# Patient Record
Sex: Female | Born: 1935 | Race: White | Hispanic: No | Marital: Single | Smoking: Former smoker
Health system: Southern US, Community
[De-identification: ages and names within clinical notes are randomized; demographics above are authoritative.]

## PROBLEM LIST (undated history)

## (undated) DIAGNOSIS — I10 Essential (primary) hypertension: Secondary | ICD-10-CM

## (undated) DIAGNOSIS — I5032 Chronic diastolic (congestive) heart failure: Secondary | ICD-10-CM

## (undated) DIAGNOSIS — F0280 Dementia in other diseases classified elsewhere without behavioral disturbance: Secondary | ICD-10-CM

## (undated) DIAGNOSIS — I2510 Atherosclerotic heart disease of native coronary artery without angina pectoris: Secondary | ICD-10-CM

## (undated) DIAGNOSIS — I4891 Unspecified atrial fibrillation: Secondary | ICD-10-CM

## (undated) DIAGNOSIS — E785 Hyperlipidemia, unspecified: Secondary | ICD-10-CM

## (undated) DIAGNOSIS — I454 Nonspecific intraventricular block: Secondary | ICD-10-CM

## (undated) DIAGNOSIS — N183 Chronic kidney disease, stage 3 unspecified: Secondary | ICD-10-CM

## (undated) DIAGNOSIS — I509 Heart failure, unspecified: Secondary | ICD-10-CM

## (undated) DIAGNOSIS — K219 Gastro-esophageal reflux disease without esophagitis: Secondary | ICD-10-CM

## (undated) DIAGNOSIS — G309 Alzheimer's disease, unspecified: Secondary | ICD-10-CM

## (undated) HISTORY — PX: ABDOMINAL HYSTERECTOMY: SHX81

## (undated) HISTORY — DX: Heart failure, unspecified: I50.9

## (undated) HISTORY — DX: Nonspecific intraventricular block: I45.4

## (undated) HISTORY — PX: CHOLECYSTECTOMY: SHX55

## (undated) HISTORY — DX: Gastro-esophageal reflux disease without esophagitis: K21.9

## (undated) HISTORY — DX: Essential (primary) hypertension: I10

## (undated) HISTORY — PX: COLONOSCOPY W/ POLYPECTOMY: SHX1380

## (undated) HISTORY — DX: Hyperlipidemia, unspecified: E78.5

---

## 2000-11-06 ENCOUNTER — Encounter: Admission: RE | Admit: 2000-11-06 | Discharge: 2001-02-04 | Payer: Self-pay | Admitting: Family Medicine

## 2004-03-20 ENCOUNTER — Encounter: Admission: RE | Admit: 2004-03-20 | Discharge: 2004-03-20 | Payer: Self-pay | Admitting: Family Medicine

## 2004-05-07 ENCOUNTER — Emergency Department (HOSPITAL_COMMUNITY): Admission: EM | Admit: 2004-05-07 | Discharge: 2004-05-07 | Payer: Self-pay | Admitting: Emergency Medicine

## 2004-06-14 ENCOUNTER — Encounter: Admission: RE | Admit: 2004-06-14 | Discharge: 2004-06-14 | Payer: Self-pay | Admitting: Family Medicine

## 2006-01-16 ENCOUNTER — Emergency Department: Payer: Self-pay | Admitting: Emergency Medicine

## 2006-06-12 ENCOUNTER — Encounter: Admission: RE | Admit: 2006-06-12 | Discharge: 2006-06-12 | Payer: Self-pay | Admitting: Family Medicine

## 2006-06-19 ENCOUNTER — Encounter: Admission: RE | Admit: 2006-06-19 | Discharge: 2006-06-19 | Payer: Self-pay | Admitting: Family Medicine

## 2006-12-26 ENCOUNTER — Encounter: Admission: RE | Admit: 2006-12-26 | Discharge: 2006-12-26 | Payer: Self-pay | Admitting: Family Medicine

## 2010-06-09 ENCOUNTER — Ambulatory Visit (HOSPITAL_COMMUNITY): Admission: RE | Admit: 2010-06-09 | Discharge: 2010-06-09 | Payer: Self-pay | Admitting: Gastroenterology

## 2010-11-27 HISTORY — PX: CORONARY ARTERY BYPASS GRAFT: SHX141

## 2011-02-12 LAB — GLUCOSE, CAPILLARY: Glucose-Capillary: 144 mg/dL — ABNORMAL HIGH (ref 70–99)

## 2011-07-30 ENCOUNTER — Emergency Department (HOSPITAL_COMMUNITY): Payer: Medicare Other

## 2011-07-30 ENCOUNTER — Inpatient Hospital Stay (HOSPITAL_COMMUNITY)
Admission: EM | Admit: 2011-07-30 | Discharge: 2011-08-18 | DRG: 234 | Disposition: A | Discharge status: Home / Self Care | Payer: Medicare Other | Attending: Surgery | Admitting: Surgery

## 2011-07-30 DIAGNOSIS — I2510 Atherosclerotic heart disease of native coronary artery without angina pectoris: Secondary | ICD-10-CM | POA: Diagnosis present

## 2011-07-30 DIAGNOSIS — E119 Type 2 diabetes mellitus without complications: Secondary | ICD-10-CM | POA: Diagnosis present

## 2011-07-30 DIAGNOSIS — I10 Essential (primary) hypertension: Secondary | ICD-10-CM | POA: Diagnosis present

## 2011-07-30 DIAGNOSIS — M810 Age-related osteoporosis without current pathological fracture: Secondary | ICD-10-CM | POA: Diagnosis present

## 2011-07-30 DIAGNOSIS — K219 Gastro-esophageal reflux disease without esophagitis: Secondary | ICD-10-CM | POA: Diagnosis present

## 2011-07-30 DIAGNOSIS — I509 Heart failure, unspecified: Secondary | ICD-10-CM | POA: Diagnosis present

## 2011-07-30 DIAGNOSIS — D62 Acute posthemorrhagic anemia: Secondary | ICD-10-CM | POA: Diagnosis not present

## 2011-07-30 DIAGNOSIS — I447 Left bundle-branch block, unspecified: Secondary | ICD-10-CM | POA: Diagnosis present

## 2011-07-30 DIAGNOSIS — Z87891 Personal history of nicotine dependence: Secondary | ICD-10-CM

## 2011-07-30 DIAGNOSIS — I059 Rheumatic mitral valve disease, unspecified: Secondary | ICD-10-CM | POA: Diagnosis present

## 2011-07-30 DIAGNOSIS — E785 Hyperlipidemia, unspecified: Secondary | ICD-10-CM | POA: Diagnosis present

## 2011-07-30 DIAGNOSIS — I359 Nonrheumatic aortic valve disorder, unspecified: Secondary | ICD-10-CM | POA: Diagnosis present

## 2011-07-30 DIAGNOSIS — D696 Thrombocytopenia, unspecified: Secondary | ICD-10-CM | POA: Diagnosis present

## 2011-07-30 DIAGNOSIS — I4891 Unspecified atrial fibrillation: Secondary | ICD-10-CM | POA: Diagnosis present

## 2011-07-30 DIAGNOSIS — I5031 Acute diastolic (congestive) heart failure: Principal | ICD-10-CM | POA: Diagnosis present

## 2011-07-30 DIAGNOSIS — Z7901 Long term (current) use of anticoagulants: Secondary | ICD-10-CM

## 2011-07-30 LAB — BASIC METABOLIC PANEL
Calcium: 9.5 mg/dL (ref 8.4–10.5)
Chloride: 98 mEq/L (ref 96–112)
Creatinine, Ser: 0.78 mg/dL (ref 0.50–1.10)
GFR calc non Af Amer: >60 mL/min (ref >60)
Glucose, Bld: 83 mg/dL (ref 70–99)
Potassium: 3.2 mEq/L — ABNORMAL LOW (ref 3.5–5.1)

## 2011-07-30 LAB — CBC
HCT: 42 % (ref 36.0–46.0)
Hemoglobin: 14.3 g/dL (ref 12.0–15.0)
MCH: 29.1 pg (ref 26.0–34.0)
MCV: 85.5 fL (ref 78.0–100.0)
Platelets: 154 10*3/uL (ref 150–400)
RDW: 13.5 % (ref 11.5–15.5)

## 2011-07-30 LAB — CK TOTAL AND CKMB (NOT AT ARMC)
CK, MB: 6.5 ng/mL (ref 0.3–4.0)
Relative Index: 3.3 — ABNORMAL HIGH (ref 0.0–2.5)
Total CK: 197 U/L — ABNORMAL HIGH (ref 7–177)

## 2011-07-30 LAB — POCT I-STAT TROPONIN I: Troponin i, poc: 0.02 ng/mL (ref 0.00–0.08)

## 2011-07-30 LAB — CARDIAC PANEL(CRET KIN+CKTOT+MB+TROPI): CK, MB: 5.4 ng/mL — ABNORMAL HIGH (ref 0.3–4.0)

## 2011-07-30 LAB — DIFFERENTIAL: Basophils Relative: 0 % (ref 0–1)

## 2011-07-30 LAB — D-DIMER, QUANTITATIVE: D-Dimer, Quant: 0.56 ug/mL-FEU — ABNORMAL HIGH (ref 0.00–0.48)

## 2011-07-30 LAB — CK: Total CK: 172 U/L (ref 7–177)

## 2011-07-30 MED ORDER — IOHEXOL 300 MG/ML  SOLN
80.0000 mL | Freq: Once | INTRAMUSCULAR | Status: AC | PRN
  Administered 2011-07-30: 80 mL via INTRAVENOUS

## 2011-07-31 LAB — PRO B NATRIURETIC PEPTIDE: Pro B Natriuretic peptide (BNP): 5642 pg/mL — ABNORMAL HIGH (ref 0–450)

## 2011-07-31 LAB — GLUCOSE, CAPILLARY: Glucose-Capillary: 102 mg/dL — ABNORMAL HIGH (ref 70–99)

## 2011-07-31 LAB — BASIC METABOLIC PANEL
Calcium: 9.9 mg/dL (ref 8.4–10.5)
GFR calc non Af Amer: >60 mL/min (ref >60)
Sodium: 141 mEq/L (ref 135–145)

## 2011-07-31 LAB — LIPID PANEL
Cholesterol: 132 mg/dL (ref 0–200)
Triglycerides: 232 mg/dL — ABNORMAL HIGH (ref <150)

## 2011-07-31 LAB — CARDIAC PANEL(CRET KIN+CKTOT+MB+TROPI): Relative Index: 3.3 — ABNORMAL HIGH (ref 0.0–2.5)

## 2011-07-31 LAB — PROTIME-INR: Prothrombin Time: 23.3 seconds — ABNORMAL HIGH (ref 11.6–15.2)

## 2011-07-31 LAB — TSH: TSH: 1.093 u[IU]/mL (ref 0.350–4.500)

## 2011-08-01 ENCOUNTER — Inpatient Hospital Stay (HOSPITAL_COMMUNITY): Payer: Medicare Other

## 2011-08-01 LAB — GLUCOSE, CAPILLARY
Glucose-Capillary: 156 mg/dL — ABNORMAL HIGH (ref 70–99)
Glucose-Capillary: 150 mg/dL — ABNORMAL HIGH (ref 70–99)
Glucose-Capillary: 196 mg/dL — ABNORMAL HIGH (ref 70–99)

## 2011-08-01 LAB — BASIC METABOLIC PANEL
BUN: 27 mg/dL — ABNORMAL HIGH (ref 6–23)
CO2: 34 mEq/L — ABNORMAL HIGH (ref 19–32)
Chloride: 96 mEq/L (ref 96–112)
Creatinine, Ser: 0.87 mg/dL (ref 0.50–1.10)

## 2011-08-01 LAB — PRO B NATRIURETIC PEPTIDE: Pro B Natriuretic peptide (BNP): 510.6 pg/mL — ABNORMAL HIGH (ref 0–450)

## 2011-08-01 MED ORDER — TECHNETIUM TC 99M TETROFOSMIN IV KIT
10.0000 | PACK | Freq: Once | INTRAVENOUS | Status: AC | PRN
  Administered 2011-08-01: 10 via INTRAVENOUS

## 2011-08-01 MED ORDER — TECHNETIUM TC 99M TETROFOSMIN IV KIT
30.0000 | PACK | Freq: Once | INTRAVENOUS | Status: AC | PRN
  Administered 2011-08-01: 30 via INTRAVENOUS

## 2011-08-02 LAB — BASIC METABOLIC PANEL
BUN: 29 mg/dL — ABNORMAL HIGH (ref 6–23)
Chloride: 98 mEq/L (ref 96–112)
GFR calc Af Amer: >60 mL/min (ref >60)
Potassium: 4.4 mEq/L (ref 3.5–5.1)

## 2011-08-02 LAB — GLUCOSE, CAPILLARY: Glucose-Capillary: 140 mg/dL — ABNORMAL HIGH (ref 70–99)

## 2011-08-02 LAB — PROTIME-INR: Prothrombin Time: 27.8 seconds — ABNORMAL HIGH (ref 11.6–15.2)

## 2011-08-02 NOTE — H&P (Signed)
[name], [name]                ACCOUNT NO.:  [id number]  MEDICAL RECORD NO.:  [id number]  LOCATION:  [facility]                         FACILITY:  [facility]  PHYSICIAN:  Isidor Holts, M.D.  DATE OF BIRTH:  May 22, 1926  DATE OF ADMISSION:  07/30/2011 DATE OF DISCHARGE:                             HISTORY & PHYSICAL   PRIMARY CARE PHYSICIAN:  Lupita Raider, MD, [facility] at the [facility].  PRIMARY CARDIOLOGIST:  Lyn Records, MD  PRIMARY GASTROENTEROLOGIST:  Danise Edge, MD  CHIEF COMPLAINT:  Progressive shortness of breath for one week.  HISTORY OF PRESENT ILLNESS:  This is an 77 year old female.  She is an excellent historian, although history is also ably supplemented by the patient's family, who were present at the bedside.  According to them, the patient developed shortness of breath approximately 1 week ago, which has become progressive.  She was seen by Dr. Verdis Prime, [facility] Cardiologist on July 27, 2011 and already has a stress Myoview scheduled for August 02, 2011.  She was started on Coumadin at the time, although at this time the indication is unclear to me.  She has continued to have progressive shortness of breath, although she has had no chest pain.  No paroxysmal nocturnal dyspnea.  No cough or fever. She has now presented at the emergency department.  PAST MEDICAL HISTORY: 1. Hypertension. 2. Type 2 diabetes mellitus. 3. Dyslipidemia. 4. Chronic left bundle branch block documented on 12-lead EKG in July     /2011. 5. Osteoporosis. 6. GERD. 7. Status post cholecystectomy. 8. Status post hysterectomy. 9. Status post colonoscopy in July 2008 with removal of 4 neoplastic     polyps. 10.Status post surveillance colonoscopy in July 2011 with removal of 3     additional polyps.  ALLERGIES:  No known drug allergies.  MEDICATION HISTORY: 1. Calcium OTC 1 tablet p.o. daily. 2. Vitamin B12 OTC 1 tablet p.o. daily. 3. Metoprolol XL succinate 50 mg  p.o. q.a.m. 4. Crestor 20 mg p.o. q.a.m. 5. Lisinopril/hydrochlorothiazide (20/12.5) 1 tablet p.o. b.i.d. 6. Metformin 500 mg p.o. b.i.d. 7. Warfarin per INR, currently at 2.5 mg p.o. q. evening.  REVIEW OF SYSTEMS:  As per HPI and chief complaint, otherwise negative. The patient denies abdominal pain, vomiting, or diarrhea.  Denies constipation, and as matter of fact, moved her bowel this a.m.  Denies fever or chills.  She is able to ambulate for good distances on the flat, and is able to negotiate stairs without getting short of breath.  The patient denies ankle swelling or lower extremity pains.  SOCIAL HISTORY:  The patient is widowed for approximately 53 years now. Ex-smoker, quit about 30 years ago, nondrinker, has no history of drug abuse, and has offspring.  FAMILY HISTORY:  The patient's mother died in her 68s, she was hypertensive.  Her father died in his 47s, he had heart problems.  PHYSICAL EXAMINATION:  VITAL SIGNS:  Temperature 97.8, pulse 53 per minute regular, respiratory rate 13, BP 112/50 mmHg, and pulse oximetry 98% on room air. GENERAL:  The patient did not appear to be in obvious acute distress at time of this evaluation, alert, communicative, not short of breath at  rest. HEENT:  No clinical pallor or jaundice.  No conjunctival injection. Hydration status appears fair. NECK:  Supple.  JVP not seen.  No palpable lymphadenopathy.  No palpable goiter. CHEST:  Clinically clear to auscultation.  No wheezes or crackles. HEART:  Sounds are heard, normal, regular, no murmurs.  Mildly bradycardic. ABDOMEN:  Full, soft, nontender.  Umbilical hernia is noted.  No palpable organomegaly or palpable masses.  Normal bowel sounds. LOWER EXTREMITY EXAMINATION:  Minimal pitting edema bilaterally. MUSCULOSKELETAL SYSTEM:  Generalized osteoarthritic changes. CENTRAL NERVOUS SYSTEM:  No focal neurologic deficit on gross examination.  INVESTIGATIONS:  CBC, WBC 7.4, hemoglobin  14.3, hematocrit 42.0, platelets 154, INR 1.67.  Electrolytes, sodium 136, potassium 3.2, chloride 98, CO2 of 28, BUN 15, creatinine 0.78, glucose 83.  BNP 1879. D-dimer 0.56.  Troponin-I at point-of-care 0.02.  Chest x-ray on July 30, 2011 showed mild hyperinflation and prominent basilar interstitial densities.  No focal airspace disease.  Chest CT angiogram on July 30, 2011 showed no PE.  No thoracic aortic aneurysm.  There was cardiomegaly and coronary artery disease.  Also mild ground glass opacities consistent with mild interstitial edema.  A 12-lead EKG on July 30, 2011 showed sinus rhythm 60 per minute, normal axis, left bundle branch block pattern.  ASSESSMENT AND PLAN: 1. Acute decompensation of congestive heart failure.  LV function is     unknown at the present time.  We shall admit the patient for     telemetric monitoring, cycle cardiac enzymes to rule out possible     acute coronary syndrome since the patient has a chronic left bundle     branch block and therefore 12-lead EKG is not very helpful in this     regard.  We shall of course, do 2-D echocardiogram.  Meanwhile,     manage with intravenous Lasix.  Consult Dr. Baldomero Lamy al, for cardiology     input.  It is likely that the patient will indeed undergo stress     testing during this hospitalization.  2. Type 2 diabetes mellitus.  This appears controlled, based on random     blood glucose.  We shall discontinue metformin for now, given the     patient's age and also decompensated congestive heart failure, but     we will manage with appropriate diet and sliding scale insulin     coverage for now.  3. Dyslipidemia.  We shall continue statin, check TSH for     completeness, check lipid profile.  4. Hypertension.  This appears controlled at present time.  We shall     continue the patient's Lisinopril/hydrochlorothiazide.    Further management will depend on clinical course.     Isidor Holts,  M.D.     CO/MEDQ  D:  07/30/2011  T:  07/30/2011  Job:  [id number]  cc:   Lupita Raider, M.D. Lyn Records, M.D. Danise Edge, M.D.  Electronically Signed by Isidor Holts M.D. on 08/02/2011 12:08:56 PM

## 2011-08-03 LAB — BASIC METABOLIC PANEL
BUN: 34 mg/dL — ABNORMAL HIGH (ref 6–23)
Chloride: 100 mEq/L (ref 96–112)
GFR calc Af Amer: >60 mL/min (ref >60)
GFR calc non Af Amer: 55 mL/min — ABNORMAL LOW (ref >60)
Glucose, Bld: 129 mg/dL — ABNORMAL HIGH (ref 70–99)
Potassium: 5 mEq/L (ref 3.5–5.1)
Sodium: 139 mEq/L (ref 135–145)

## 2011-08-03 LAB — CBC
HCT: 41.8 % (ref 36.0–46.0)
Hemoglobin: 13.9 g/dL (ref 12.0–15.0)
MCHC: 33.3 g/dL (ref 30.0–36.0)
MCV: 85.7 fL (ref 78.0–100.0)
RDW: 13.8 % (ref 11.5–15.5)

## 2011-08-03 LAB — GLUCOSE, CAPILLARY: Glucose-Capillary: 148 mg/dL — ABNORMAL HIGH (ref 70–99)

## 2011-08-03 LAB — PROTIME-INR: INR: 1.55 — ABNORMAL HIGH (ref 0.00–1.49)

## 2011-08-04 ENCOUNTER — Inpatient Hospital Stay (HOSPITAL_COMMUNITY): Payer: Medicare Other

## 2011-08-04 DIAGNOSIS — Z01810 Encounter for preprocedural cardiovascular examination: Secondary | ICD-10-CM

## 2011-08-04 DIAGNOSIS — I2510 Atherosclerotic heart disease of native coronary artery without angina pectoris: Secondary | ICD-10-CM

## 2011-08-04 LAB — POCT ACTIVATED CLOTTING TIME: Activated Clotting Time: 171 seconds

## 2011-08-04 LAB — GLUCOSE, CAPILLARY
Glucose-Capillary: 138 mg/dL — ABNORMAL HIGH (ref 70–99)
Glucose-Capillary: 131 mg/dL — ABNORMAL HIGH (ref 70–99)
Glucose-Capillary: 149 mg/dL — ABNORMAL HIGH (ref 70–99)

## 2011-08-04 LAB — BASIC METABOLIC PANEL
CO2: 29 mEq/L (ref 19–32)
Chloride: 103 mEq/L (ref 96–112)
GFR calc Af Amer: >60 mL/min (ref >60)
Potassium: 4 mEq/L (ref 3.5–5.1)

## 2011-08-05 DIAGNOSIS — Z01810 Encounter for preprocedural cardiovascular examination: Secondary | ICD-10-CM

## 2011-08-05 DIAGNOSIS — I2510 Atherosclerotic heart disease of native coronary artery without angina pectoris: Secondary | ICD-10-CM

## 2011-08-05 LAB — CBC
HCT: 38.2 % (ref 36.0–46.0)
Hemoglobin: 12.5 g/dL (ref 12.0–15.0)
MCH: 28.4 pg (ref 26.0–34.0)
MCV: 86.8 fL (ref 78.0–100.0)
RBC: 4.4 MIL/uL (ref 3.87–5.11)

## 2011-08-05 LAB — GLUCOSE, CAPILLARY: Glucose-Capillary: 104 mg/dL — ABNORMAL HIGH (ref 70–99)

## 2011-08-05 LAB — BASIC METABOLIC PANEL
BUN: 22 mg/dL (ref 6–23)
CO2: 30 mEq/L (ref 19–32)
Chloride: 104 mEq/L (ref 96–112)
GFR calc Af Amer: >60 mL/min (ref >60)
Glucose, Bld: 144 mg/dL — ABNORMAL HIGH (ref 70–99)
Potassium: 4.1 mEq/L (ref 3.5–5.1)

## 2011-08-06 LAB — PROTIME-INR
INR: 1.23 (ref 0.00–1.49)
Prothrombin Time: 15.8 seconds — ABNORMAL HIGH (ref 11.6–15.2)

## 2011-08-06 LAB — GLUCOSE, CAPILLARY: Glucose-Capillary: 137 mg/dL — ABNORMAL HIGH (ref 70–99)

## 2011-08-06 NOTE — Cardiovascular Report (Signed)
[name], [name]                ACCOUNT NO.:  [id number]  MEDICAL RECORD NO.:  [id number]  LOCATION:  2504                         FACILITY:  [facility]  PHYSICIAN:  Lyn Records, M.D.   DATE OF BIRTH:  [birth year]  DATE OF PROCEDURE: DATE OF DISCHARGE:                           CARDIAC CATHETERIZATION   REASON FOR VISIT:  Heart catheterization report.  INDICATION FOR PROCEDURE:  Recent sudden pulmonary edema, low-normal left ventricle ejection fraction of 40% with inferior regional wall motion abnormality noted.  The patient also noted to have a high-risk myocardial perfusion study which suggested a low ejection fraction than the echocardiogram.  PROCEDURES PERFORMED: 1. Left heart cath. 2. Selective coronary angio. 3. Left ventriculography.  DESCRIPTION:  After informed consent, the right arm and right radial region was sterilely prepped and draped.  We were able to gain easy access into the right radial artery, however, we were not able to easily advance the tight J guidewire.  Angiography demonstrated a radial loop. At around this time of the case, a code STEMI was called on the patient in the emergency room for which I was responsible.  Resident spent time trying to negotiate the radial loop we converted to femoral access.  We then performed coronary angiography using 6-French equipment.  We did not perform left ventriculography.  LV function is already known.  We did selective coronary angiography using three-point catheters. Following the procedure, a wristband was applied to the right radial access site and compression by manual venous was used in the right femoral area with good hemostasis.  RESULTS: 1. Hemodynamic data:     a.     Aortic pressure 128/62.     b.     Left ventricular pressure:  Never recorded as we did not end      of the left ventricle. 2. Left ventriculography:  Not performed. 3. Selective coronary angiography.     a.     Left main coronary  widely patent.     b.     Left anterior descending coronary:  Moderate calcification.      The LAD is totally occluded in the midvessel after the origin of      the first septal perforator and a large first diagonal.  This      diagonal supplies collaterals around the left ventricular apex to      the distal LAD.  The LAD proximal to the first septal perforator      and the first diagonal contains a 90% stenosis.  The LAD is also      collateralized from the distal right coronary.  The LAD is heavily      calcified.     c.     Circumflex artery:  There is an ostial eccentric 90%      stenosis.  This is followed by segmental 70% narrowing.  A large      tortuous obtuse marginal that arises.  It gives collaterals to a      moderate-sized second obtuse marginal that is totally occluded and      fills late by retrograde collaterals.     d.     Right coronary:  The right coronary artery has three severe      stenoses.  There are tandem 90% stenoses of the mid segment and a      95% stenosis in the distal vessel before the origin of the PDA and      left ventricular branch.  As mentioned earlier, the distal LAD      does receive collaterals from the distal RCA.  CONCLUSION: 1. Severe three-vessel coronary disease with acute diastolic heart     failure as the cause of patient's presentation with pulmonary     edema. 2. Total occlusion of the mid LAD 90% stenosis before the origin of     the large diagonal that collateralizes the LAD, 95% stenosis of the     ostial circumflex, total occlusion of circumflex beyond the first     obtuse marginal with collaterals from OM1 to OM2 and multifocal     high-grade obstruction in the right coronary which also     collateralizes the LAD. 3. Decreased LV function as demonstrated by nuclear imaging at the     time of perfusion scanning and also echocardiography.  The echo     demonstrates inferior wall severe hypokinesis and an overall     ejection  fraction of 40%.  PLAN:  The patient will be considered for coronary artery bypass grafting.  I am concerned about her mild cognitive impairment, however, despite that she lives independently, drives her own car and continues to work as a Teacher, music houses.     Lyn Records, M.D.     HWS/MEDQ  D:  08/04/2011  T:  08/05/2011  Job:  [id number]  Electronically Signed by Verdis Prime M.D. on 08/06/2011 08:12:25 PM

## 2011-08-07 LAB — GLUCOSE, CAPILLARY
Glucose-Capillary: 119 mg/dL — ABNORMAL HIGH (ref 70–99)
Glucose-Capillary: 144 mg/dL — ABNORMAL HIGH (ref 70–99)
Glucose-Capillary: 93 mg/dL (ref 70–99)

## 2011-08-07 NOTE — Consult Note (Signed)
Denise, Ross                ACCOUNT NO.:  [id number]  MEDICAL RECORD NO.:  [id number]  LOCATION:  4731                         FACILITY:  [facility]  PHYSICIAN:  Georga Hacking, M.D.DATE OF BIRTH:  [birth year]  DATE OF CONSULTATION:  07/31/2011                                CONSULTATION   HISTORY:  I have asked to see this 77 year old female for cardiac consultation.  The patient has a longstanding history of hypertension and diabetes.  [name] has been living independently at home, but the family has been concerned about her ability to stay with herself and note some forgetfulness.  [name] was taken for a routine appointment last week to see Dr. Lupita Raider on Wednesday and then was later taken to Dr. Michaelle Copas office that day.  Exactly what transpired is unclear, but evidently the patient may have had a rapid heartbeat and was begun on anticoagulation and a beta-blocker.  The patient cannot give me any details of what happened and the family cannot either.  [name] called her son 2 days ago and left about 5 messages talking about coming appointments, then called her son yesterday morning complaining of shortness of breath and he went over to her house and brought her to the emergency room.  In the emergency room, [name] had a normal CBC, pro-time INR was 1.67, D-dimer was 0.56, potassium was 3.2.  Her CPK was elevated, but her troponin was normal.  B-natriuretic peptide is 1879.  Cholesterol is 132. Triglycerides were 232.  TSH is 1.093.  It was entirely unclear exactly what the history was yesterday and [name] had a two-view chest x-ray that showed some interstitial densities in the lower lungs, but not upper lungs.  CT angiogram of the chest showed heavy coronary calcifications. There was no thoracic aneurysm and [name] had a possible adrenal adenoma. There was possible mild interstitial edema, but this was unclear.  [name] was admitted and was given some furosemide and [name] was in sinus  rhythm on admission.  Presently, [name] does not complain of any shortness of breath.  The family knows that [name] maybe had some worsening dyspnea, although, the patient does not really complain of dyspnea.  [name] denies any anginal-type chest pain or chest pain consistent with myocardial ischemia.  PAST MEDICAL HISTORY:  Remarkable for chronic left bundle-branch block, osteoporosis, esophageal reflux, type 2 diabetes, dyslipidemia, and hypertension.  PAST SURGICAL HISTORY:  Cholecystectomy and hysterectomy.  ALLERGIES:  None.  MEDICATIONS PRIOR TO ADMISSION: 1. Crestor 20 mg. 2. Lisinopril/HCTZ b.i.d. 3. Metformin 500 b.i.d. 4. Warfarin. 5. Metoprolol succinate 50 mg daily. 6. Vitamin B12. 7. Calcium.  SOCIAL HISTORY:  [name] has been widowed for a long time.  [name] quit smoking many years ago.  [name] does not use alcohol to excess.  FAMILY HISTORY:  Mother died in her 43s with hypertension.  Father died in his 27s with heart problems.  REVIEW OF SYSTEMS:  Her weight has been stable.  [name] has had some mild memory issues.  [name] has mild decrease in her hearing as well as her vision.  [name] denies diarrhea, constipation, melena, or hematochezia.  No difficulty with her urinary system, except has increased frequency  since been started with Lasix.  Mild arthritis.  Normally can do housework without difficulty.  Other than as noted above, remainder of review of systems is unremarkable.  PHYSICAL EXAMINATION:  GENERAL:  Pleasant elderly female, in no acute distress. VITAL SIGNS:  Blood pressure is 102/61, pulse currently 60 and regular. SKIN:  Warm and dry. HEENT:  EOMI.  PERRLA.  CNS clear.  Fundi not examined.  Pharynx negative. NECK:  Supple without masses.  JVP is flat.  There are no carotid bruits noted. LUNGS:  Clear bilaterally without rales. CARDIOVASCULAR:  Faint 1-2/6 systolic murmur at the aortic area. ABDOMEN:  Soft and nontender. EXTREMITIES:  Her femoral pulses are 2+  without bruits.  There is no peripheral edema noted. EXTREMITIES:  No cyanosis or clubbing.  Distal pulses are 2+. NEUROLOGIC:  Normal cranial nerves.  [name] knew the year and the month, but could not tell me the day of the week.  EKG shows a left bundle-branch block.  Lab is noted as above.  IMPRESSION: 1. Vague history of possible increased shortness of breath, elevation     of the BNP consistent with mild volume overload, unclear whether     this is acute or chronic. 2. Atrial fibrillation by history. 3. Chronic left bundle-branch block. 4. Hypertensive heart disease. 5. Type 2 diabetes. 6. Hyperlipidemia.  RECOMMENDATIONS:  History is somewhat confusing.  [name] needs to have an echocardiogram.  Continue warfarin at this time.  I would agree with diuresis at the present time.  We will go ahead and since [name] was already set up for a Lexiscan, currently go ahead and plan this tomorrow per Dr. Katrinka Blazing.  He will review the records and further workup per Dr. Katrinka Blazing.     Georga Hacking, M.D.     WST/MEDQ  D:  07/31/2011  T:  07/31/2011  Job:  [id number]  cc:   Lyn Records, M.D. Lupita Raider, M.D.  Electronically Signed by Lacretia Nicks. Donnie Aho M.D. on 08/07/2011 03:55:14 PM

## 2011-08-07 NOTE — Consult Note (Signed)
[name], [name]                ACCOUNT NO.:  [id number]  MEDICAL RECORD NO.:  [id number]  LOCATION:  2020                         FACILITY:  [facility]  PHYSICIAN:  Evelene Croon, M.D.     DATE OF BIRTH:  September 11, 1926  DATE OF CONSULTATION:  08/04/2011 DATE OF DISCHARGE:                                CONSULTATION   REFERRING PHYSICIAN:  Lyn Records, MD  REASON FOR CONSULTATION:  Severe three-vessel coronary artery disease.  CLINICAL HISTORY:  I was asked by Dr. Katrinka Blazing to evaluate [name] for consideration of coronary artery bypass graft surgery.  She is an 77- year-old woman who is followed by Dr. Cam Hai.  She was apparently seen recently and found to be in atrial fibrillation and was referred to Dr. Katrinka Blazing on July 27, 2011.  Apparently, when she was seen by Dr. Katrinka Blazing, she really was not complaining of any chest pain or shortness of breath and was started on metoprolol as well as Coumadin for atrial fibrillation.  She was also scheduled for a stress Myoview examination on August 02, 2011.  She said that after that visit she developed recurrent progressive shortness of breath with minimal exertion.  Her son said that he had noticed that she was not doing as much activity as she had been.  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  He saw something strange about her activity level, but just recently this occurred.  She presented to the emergency room and was noted to have a BNP of 1879 with a mildly elevated troponin of 0.02.  She had a chest x-ray, this showed mild hyperinflation and prominent basilar interstitial densities with no airspace disease.  She had a CT angiogram of the chest that showed no evidence of pulmonary embolism.  There was cardiomegaly and evidence of coronary artery disease with calcifications present.  There were some mild ground-glass opacities consistent with mild interstitial  edema. Electrocardiogram showed a left bundle-branch block pattern.  She underwent a 2-D echocardiogram on August 01, 2011, which was not felt to be a great study, but did show left ventricular ejection fraction of 50-55%.  There was grade 2 diastolic dysfunction.  The aortic valve was poorly visualized, but was felt to have mild to moderate aortic stenosis by gradient.  The mean gradient was noted to be 13 mmHg and peak gradient of 23 mmHg.  The aortic valve area was 1.02 sq cm by VTI and 1.21 sq cm by V-max.  The leaflets were noted to be calcified and thickened.  There was also mild to moderate aortic insufficiency.  There was mild mitral regurgitation with a calcified mitral annulus.  Right ventricular function appeared normal.  There was no tricuspid or pulmonary regurgitation.  She subsequently underwent a nuclear stress test, which showed large area of scar involving the inferior lateral wall with peri-infarct ischemia in the inferoapical and inferoseptal walls.  The inferolateral wall was akinetic consistent with scar. Ejection fraction was measured at 28%.  Her subsequent cardiac enzymes appeared fairly unremarkable.  She underwent cardiac catheterization today, which showed severe three-vessel coronary artery disease.  There was total occlusion  of the mid LAD after the origin of the first septal perforator and large first diagonal branch.  The diagonal supplied collaterals around the left ventricular apex to the distal LAD.  The LAD proximally also had about 90% stenosis before this large diagonal branch.  The left circumflex had an ostial 90% stenosis followed by segmental 70% narrowing.  There was a large tortuous obtuse marginal. It gives collaterals to moderate-sized second marginal branch that was totally occluded and filled late by collaterals.  Right coronary artery had three severe stenoses.  There were tandem 90% midvessel stenosis and 95% stenosis in the distal  vessel before the origin of posterior descending.  The aortic valve apparently could not be crossed.  Her review of systems is as follows:  GENERAL:  She denies any fever or chills.  She has had no recent weight changes.  She has had recent fatigue.  EYES:  Negative.  ENT:  She has upper denture and several remaining teeth in her lower jaw.  ENDOCRINE:  She has adult-onset diabetes.  She denies hypothyroidism.  CARDIOVASCULAR:  She denies any chest pain or pressure.  She has had exertional dyspnea as well as dyspnea at rest.  She reports orthopnea.  She denies any peripheral edema or palpitations.  RESPIRATORY:  She denies cough or sputum production.  GI:  She has had no nausea or vomiting.  She denies melena or bright red blood per rectum.  GU:  She denies dysuria or hematuria. MUSCULOSKELETAL:  She denies arthralgias or myalgias.  NEUROLOGICAL: She denies any focal weakness or numbness.  She denies dizziness or syncope.  She has never had TIA or stroke.  Her family does note some short-term memory loss.  ALLERGIES:  None.  PSYCHIATRIC:  Negative. HEMATOLOGICAL:  Negative.  MEDICATIONS:  Prior to admission are as noted on her medicine reconciliation form.  These were reviewed.  PAST MEDICAL HISTORY:  Significant for: 1. Type 2 diabetes. 2. She has a history of hypertension. 3. History of dyslipidemia. 4. History of osteoporosis. 5. History of gastroesophageal reflux disease. 6. She is status post cholecystectomy, appendectomy, and hysterectomy. 7. She has had colonoscopy with removal of neoplastic polyps in 2008     and another colonoscopy in July 2011 with removal of three     additional polyps.  SOCIAL HISTORY:  She is widowed.  She is a previous smoker, but quit that 30 years ago.  She denies any alcohol or drug use.  FAMILY HISTORY:  Positive for cardiac disease.  Father died in his 54s with heart disease and mother died in her 26s.  PHYSICAL EXAMINATION:  VITAL SIGNS:   She is afebrile.  Blood pressure is 105/70.  Pulse is 65 and regular.  Respiratory rate is 20 and unlabored. GENERAL:  She is an elderly white female in no distress. HEENT:  Normocephalic and atraumatic.  Pupils are equal and reactive to light.  Extraocular muscles are intact.  Oropharynx is clear.  Her remaining lower teeth are in fair condition. NECK:  Normal carotid pulses bilaterally.  There is a transmitted murmur or bruit at both sides of the neck.  There is no adenopathy or thyromegaly. CARDIAC:  Regular rate and rhythm with a grade 1-2/6 systolic murmur over aorta. LUNGS:  Clear. ABDOMEN:  Active bowel sounds.  Abdomen is soft and nontender.  There are no palpable masses or organomegaly.  There is small umbilical hernia. EXTREMITIES:  No peripheral edema.  Pedal pulses are palpable bilaterally. SKIN:  Warm and  dry. NEUROLOGIC:  Alert and oriented x3.  Motor and sensory exam is grossly normal.  Electrolytes were normal with BUN of 22, creatinine of 0.8.  White blood cell count is 5.4, hemoglobin of 12.5, and platelet count is 124,000. Total cholesterol was 132 with triglyceride 232, HDL 47, and LDL 39. Hemoglobin A1c was 6.2.  IMPRESSION:  [name] has severe three-vessel coronary artery disease with evidence of significant infarct and ischemia by nuclear stress test.  She has moderate to severe left ventricular dysfunction.  Her echocardiogram also suggests the degree of aortic stenosis, but this is difficult to interpret based on that echocardiogram and the valve was not crossed during catheterization.  I agree that coronary artery bypass graft surgery is the best treatment for coronary artery disease given the multiple high-grade stenoses and calcification of her arteries.  Her aortic valve would have to be evaluated intraoperatively with TEE and decision made about replacing it.  I discussed the operative procedure of coronary artery bypass surgery and possible aortic  valve replacement with the patient and her family.  We discussed alternatives, benefits, and risks including but not limited to bleeding, blood transfusion, infection, stroke, myocardial infarction, graft failure, heart block requiring permanent pacemaker, organ dysfunction, and death.  She understands all of this and would like to proceed with surgery.  I told her that I would probably not be able to do surgery until next Thursday, but I would reevaluate the schedule and let her know.     Evelene Croon, M.D.     BB/MEDQ  D:  08/05/2011  T:  08/05/2011  Job:  [id number]  Electronically Signed by Evelene Croon M.D. on 08/07/2011 03:46:20 PM

## 2011-08-08 LAB — GLUCOSE, CAPILLARY
Glucose-Capillary: 180 mg/dL — ABNORMAL HIGH (ref 70–99)
Glucose-Capillary: 162 mg/dL — ABNORMAL HIGH (ref 70–99)

## 2011-08-09 ENCOUNTER — Inpatient Hospital Stay (HOSPITAL_COMMUNITY): Payer: Medicare Other

## 2011-08-09 LAB — CBC
HCT: 37.6 % (ref 36.0–46.0)
Hemoglobin: 12.6 g/dL (ref 12.0–15.0)
MCH: 28.8 pg (ref 26.0–34.0)
MCHC: 33.5 g/dL (ref 30.0–36.0)

## 2011-08-09 LAB — GLUCOSE, CAPILLARY
Glucose-Capillary: 120 mg/dL — ABNORMAL HIGH (ref 70–99)
Glucose-Capillary: 228 mg/dL — ABNORMAL HIGH (ref 70–99)

## 2011-08-09 LAB — BLOOD GAS, ARTERIAL
FIO2: 0.21 %
Patient temperature: 98.6
TCO2: 29.5 mmol/L (ref 0–100)
pH, Arterial: 7.443 — ABNORMAL HIGH (ref 7.350–7.400)

## 2011-08-09 LAB — COMPREHENSIVE METABOLIC PANEL
BUN: 19 mg/dL (ref 6–23)
CO2: 31 mEq/L (ref 19–32)
Chloride: 104 mEq/L (ref 96–112)
Creatinine, Ser: 1.02 mg/dL (ref 0.50–1.10)
GFR calc Af Amer: >60 mL/min (ref >60)
GFR calc non Af Amer: 52 mL/min — ABNORMAL LOW (ref >60)
Glucose, Bld: 141 mg/dL — ABNORMAL HIGH (ref 70–99)
Total Bilirubin: 0.3 mg/dL (ref 0.3–1.2)

## 2011-08-09 LAB — BASIC METABOLIC PANEL
BUN: 17 mg/dL (ref 6–23)
CO2: 34 mEq/L — ABNORMAL HIGH (ref 19–32)
Calcium: 9.6 mg/dL (ref 8.4–10.5)
GFR calc non Af Amer: >60 mL/min (ref >60)
Glucose, Bld: 104 mg/dL — ABNORMAL HIGH (ref 70–99)

## 2011-08-09 LAB — PROTIME-INR: Prothrombin Time: 14.3 seconds (ref 11.6–15.2)

## 2011-08-10 ENCOUNTER — Inpatient Hospital Stay (HOSPITAL_COMMUNITY): Payer: Medicare Other

## 2011-08-10 DIAGNOSIS — I2510 Atherosclerotic heart disease of native coronary artery without angina pectoris: Secondary | ICD-10-CM

## 2011-08-10 HISTORY — PX: OTHER SURGICAL HISTORY: SHX169

## 2011-08-10 LAB — POCT I-STAT 3, ART BLOOD GAS (G3+)
Acid-base deficit: 1 mmol/L (ref 0.0–2.0)
Acid-base deficit: 3 mmol/L — ABNORMAL HIGH (ref 0.0–2.0)
Bicarbonate: 24.2 mEq/L — ABNORMAL HIGH (ref 20.0–24.0)
Bicarbonate: 25.1 mEq/L — ABNORMAL HIGH (ref 20.0–24.0)
Bicarbonate: 25.1 mEq/L — ABNORMAL HIGH (ref 20.0–24.0)
O2 Saturation: 100 %
O2 Saturation: 100 %
Patient temperature: 36.3
TCO2: 32 mmol/L (ref 0–100)
TCO2: 25 mmol/L (ref 0–100)
TCO2: 26 mmol/L (ref 0–100)
TCO2: 27 mmol/L (ref 0–100)
pCO2 arterial: 46 mmHg — ABNORMAL HIGH (ref 35.0–45.0)
pH, Arterial: 7.393 (ref 7.350–7.400)
pH, Arterial: 7.408 — ABNORMAL HIGH (ref 7.350–7.400)
pO2, Arterial: 489 mmHg — ABNORMAL HIGH (ref 80.0–100.0)
pO2, Arterial: 406 mmHg — ABNORMAL HIGH (ref 80.0–100.0)
pO2, Arterial: 76 mmHg — ABNORMAL LOW (ref 80.0–100.0)

## 2011-08-10 LAB — CBC
HCT: 30.1 % — ABNORMAL LOW (ref 36.0–46.0)
HCT: 31.9 % — ABNORMAL LOW (ref 36.0–46.0)
Hemoglobin: 10.8 g/dL — ABNORMAL LOW (ref 12.0–15.0)
MCH: 28.8 pg (ref 26.0–34.0)
MCHC: 33.9 g/dL (ref 30.0–36.0)
MCV: 86.3 fL (ref 78.0–100.0)
MCV: 85.1 fL (ref 78.0–100.0)
Platelets: 132 10*3/uL — ABNORMAL LOW (ref 150–400)
Platelets: 98 10*3/uL — ABNORMAL LOW (ref 150–400)
Platelets: 122 10*3/uL — ABNORMAL LOW (ref 150–400)
RBC: 4.16 MIL/uL (ref 3.87–5.11)
RBC: 3.75 MIL/uL — ABNORMAL LOW (ref 3.87–5.11)
RDW: 13.8 % (ref 11.5–15.5)
RDW: 13.6 % (ref 11.5–15.5)
WBC: 5.5 10*3/uL (ref 4.0–10.5)
WBC: 11 10*3/uL — ABNORMAL HIGH (ref 4.0–10.5)
WBC: 16.3 10*3/uL — ABNORMAL HIGH (ref 4.0–10.5)

## 2011-08-10 LAB — BASIC METABOLIC PANEL
GFR calc Af Amer: >60 mL/min (ref >60)
GFR calc non Af Amer: >60 mL/min (ref >60)
Potassium: 4.2 mEq/L (ref 3.5–5.1)
Sodium: 140 mEq/L (ref 135–145)

## 2011-08-10 LAB — URINE MICROSCOPIC-ADD ON

## 2011-08-10 LAB — GLUCOSE, CAPILLARY
Glucose-Capillary: 101 mg/dL — ABNORMAL HIGH (ref 70–99)
Glucose-Capillary: 111 mg/dL — ABNORMAL HIGH (ref 70–99)

## 2011-08-10 LAB — POCT I-STAT 4, (NA,K, GLUC, HGB,HCT)
Glucose, Bld: 119 mg/dL — ABNORMAL HIGH (ref 70–99)
Glucose, Bld: 144 mg/dL — ABNORMAL HIGH (ref 70–99)
HCT: 29 % — ABNORMAL LOW (ref 36.0–46.0)
HCT: 24 % — ABNORMAL LOW (ref 36.0–46.0)
HCT: 24 % — ABNORMAL LOW (ref 36.0–46.0)
Hemoglobin: 8.2 g/dL — ABNORMAL LOW (ref 12.0–15.0)
Hemoglobin: 10.2 g/dL — ABNORMAL LOW (ref 12.0–15.0)
Potassium: 4.2 mEq/L (ref 3.5–5.1)
Potassium: 4.3 mEq/L (ref 3.5–5.1)
Potassium: 5.3 mEq/L — ABNORMAL HIGH (ref 3.5–5.1)
Sodium: 138 mEq/L (ref 135–145)
Sodium: 134 mEq/L — ABNORMAL LOW (ref 135–145)
Sodium: 135 mEq/L (ref 135–145)
Sodium: 137 mEq/L (ref 135–145)

## 2011-08-10 LAB — HEMOGLOBIN AND HEMATOCRIT, BLOOD
HCT: 25.2 % — ABNORMAL LOW (ref 36.0–46.0)
Hemoglobin: 8.4 g/dL — ABNORMAL LOW (ref 12.0–15.0)

## 2011-08-10 LAB — APTT: aPTT: 40 seconds — ABNORMAL HIGH (ref 24–37)

## 2011-08-10 LAB — PROTIME-INR
INR: 1.08 (ref 0.00–1.49)
Prothrombin Time: 14.2 seconds (ref 11.6–15.2)
Prothrombin Time: 17.3 seconds — ABNORMAL HIGH (ref 11.6–15.2)

## 2011-08-10 LAB — POCT I-STAT, CHEM 8
Calcium, Ion: 1.21 mmol/L (ref 1.12–1.32)
Glucose, Bld: 131 mg/dL — ABNORMAL HIGH (ref 70–99)
HCT: 32 % — ABNORMAL LOW (ref 36.0–46.0)
Hemoglobin: 10.9 g/dL — ABNORMAL LOW (ref 12.0–15.0)
TCO2: 22 mmol/L (ref 0–100)

## 2011-08-10 LAB — URINALYSIS, ROUTINE W REFLEX MICROSCOPIC
Glucose, UA: NEGATIVE mg/dL
Hgb urine dipstick: NEGATIVE
Protein, ur: NEGATIVE mg/dL

## 2011-08-10 LAB — PLATELET COUNT: Platelets: 122 10*3/uL — ABNORMAL LOW (ref 150–400)

## 2011-08-10 LAB — CREATININE, SERUM
Creatinine, Ser: 0.56 mg/dL (ref 0.50–1.10)
GFR calc Af Amer: >60 mL/min (ref >60)
GFR calc non Af Amer: >60 mL/min (ref >60)

## 2011-08-10 LAB — POCT I-STAT GLUCOSE: Glucose, Bld: 127 mg/dL — ABNORMAL HIGH (ref 70–99)

## 2011-08-11 ENCOUNTER — Inpatient Hospital Stay (HOSPITAL_COMMUNITY): Payer: Medicare Other

## 2011-08-11 LAB — POCT I-STAT, CHEM 8
BUN: 13 mg/dL (ref 6–23)
Calcium, Ion: 1.13 mmol/L (ref 1.12–1.32)
Chloride: 102 mEq/L (ref 96–112)
Glucose, Bld: 176 mg/dL — ABNORMAL HIGH (ref 70–99)
Potassium: 4.4 mEq/L (ref 3.5–5.1)

## 2011-08-11 LAB — MAGNESIUM: Magnesium: 2.1 mg/dL (ref 1.5–2.5)

## 2011-08-11 LAB — CBC
HCT: 27 % — ABNORMAL LOW (ref 36.0–46.0)
Hemoglobin: 8.6 g/dL — ABNORMAL LOW (ref 12.0–15.0)
MCHC: 33.9 g/dL (ref 30.0–36.0)
MCV: 86.3 fL (ref 78.0–100.0)
Platelets: 99 10*3/uL — ABNORMAL LOW (ref 150–400)
RBC: 3.13 MIL/uL — ABNORMAL LOW (ref 3.87–5.11)
WBC: 9.6 10*3/uL (ref 4.0–10.5)

## 2011-08-11 LAB — BASIC METABOLIC PANEL
Calcium: 8 mg/dL — ABNORMAL LOW (ref 8.4–10.5)
Chloride: 103 mEq/L (ref 96–112)
Creatinine, Ser: 0.51 mg/dL (ref 0.50–1.10)
GFR calc Af Amer: >60 mL/min (ref >60)
GFR calc non Af Amer: >60 mL/min (ref >60)

## 2011-08-11 LAB — PROTIME-INR
INR: 1.32 (ref 0.00–1.49)
Prothrombin Time: 16.6 seconds — ABNORMAL HIGH (ref 11.6–15.2)

## 2011-08-11 LAB — CREATININE, SERUM
Creatinine, Ser: 0.69 mg/dL (ref 0.50–1.10)
GFR calc Af Amer: >60 mL/min (ref >60)

## 2011-08-11 LAB — GLUCOSE, CAPILLARY
Glucose-Capillary: 103 mg/dL — ABNORMAL HIGH (ref 70–99)
Glucose-Capillary: 113 mg/dL — ABNORMAL HIGH (ref 70–99)
Glucose-Capillary: 115 mg/dL — ABNORMAL HIGH (ref 70–99)
Glucose-Capillary: 117 mg/dL — ABNORMAL HIGH (ref 70–99)
Glucose-Capillary: 125 mg/dL — ABNORMAL HIGH (ref 70–99)
Glucose-Capillary: 129 mg/dL — ABNORMAL HIGH (ref 70–99)
Glucose-Capillary: 136 mg/dL — ABNORMAL HIGH (ref 70–99)
Glucose-Capillary: 185 mg/dL — ABNORMAL HIGH (ref 70–99)
Glucose-Capillary: 91 mg/dL (ref 70–99)

## 2011-08-12 ENCOUNTER — Inpatient Hospital Stay (HOSPITAL_COMMUNITY): Payer: Medicare Other

## 2011-08-12 DIAGNOSIS — IMO0001 Reserved for inherently not codable concepts without codable children: Secondary | ICD-10-CM

## 2011-08-12 DIAGNOSIS — E1165 Type 2 diabetes mellitus with hyperglycemia: Secondary | ICD-10-CM

## 2011-08-12 LAB — GLUCOSE, CAPILLARY
Glucose-Capillary: 89 mg/dL (ref 70–99)
Glucose-Capillary: 127 mg/dL — ABNORMAL HIGH (ref 70–99)
Glucose-Capillary: 206 mg/dL — ABNORMAL HIGH (ref 70–99)
Glucose-Capillary: 178 mg/dL — ABNORMAL HIGH (ref 70–99)

## 2011-08-12 LAB — CBC
MCHC: 33.3 g/dL (ref 30.0–36.0)
MCV: 86.9 fL (ref 78.0–100.0)
Platelets: 107 10*3/uL — ABNORMAL LOW (ref 150–400)
RDW: 14.6 % (ref 11.5–15.5)
WBC: 8.1 10*3/uL (ref 4.0–10.5)

## 2011-08-12 LAB — BASIC METABOLIC PANEL
Calcium: 8.6 mg/dL (ref 8.4–10.5)
Creatinine, Ser: 0.79 mg/dL (ref 0.50–1.10)
GFR calc non Af Amer: >60 mL/min (ref >60)
Glucose, Bld: 75 mg/dL (ref 70–99)
Sodium: 135 mEq/L (ref 135–145)

## 2011-08-12 NOTE — Op Note (Signed)
  [name], [name]                ACCOUNT NO.:  [id number]  MEDICAL RECORD NO.:  [id number]  LOCATION:  2309                         FACILITY:  [facility]  PHYSICIAN:  Bedelia Person, M.D.        DATE OF BIRTH:  1925/12/19  DATE OF PROCEDURE:  08/10/2011 DATE OF DISCHARGE:                              OPERATIVE REPORT   SURGEON:  Bedelia Person, MD  The patient is scheduled at this time for coronary artery bypass grafting and the possibility of aortic valve replacement.  The TEE will be used intraoperatively to assess the need for aortic valve repair or replacement as well as left ventricular function after coronary artery bypass grafting.  The patient has no history of esophageal or gastric pathology.  The prebypass examination was performed after the patient was intubated with an oral endotracheal tube and gastric contents were suctioned with a nasogastric tube.  The probe was placed down the oropharynx with no significant resistance.  The prebypass examination revealed the left ventricle to be significantly thickened.  There was good contractility of the anterior wall; however, the patient had akinetic portions of the inferior wall and hypokinesis of the inferior lateral wall.  The left atrium was slightly enlarged.  Mitral valve appeared normal in appearance.  Color Doppler did reveal 2+ central mitral regurgitant flow.  The appendage was clean.  The inner atrial septum was intact. The aortic valve had thickened leaflets, all three leaflets appear to be opening and closing appropriately.  There was minimal restrictions of any leaflet.  There was just slight calcification of the annular ring. Aortic valve area was measured at approximately 1 centimeter squared. There was difficulty obtaining gradients; however, the gradients appeared to be very low.  There was just a very minimal central aortic insufficiency.  Due to this examination, the patient was scheduled only for coronary artery  bypass grafting, was placed on coronary artery bypass and at the completion of the bypass.  The examination was minimally changed from the prebypass examination.  Left ventricular contractility was initially slightly depressed, which quickly returned to prebypass levels.  The aortic valve and mitral valve remained unchanged.  Right heart exam, which was initially normal with trace tricuspid regurgitation shown prebypass and also extended postbypass noting that the Swan-Ganz catheter was present.  There were no other new significant findings.          ______________________________ Bedelia Person, M.D.     LK/MEDQ  D:  08/11/2011  T:  08/11/2011  Job:  [id number]  Electronically Signed by Bedelia Person M.D. on 08/12/2011 08:01:31 PM

## 2011-08-13 ENCOUNTER — Inpatient Hospital Stay (HOSPITAL_COMMUNITY): Payer: Medicare Other

## 2011-08-13 LAB — GLUCOSE, CAPILLARY
Glucose-Capillary: 180 mg/dL — ABNORMAL HIGH (ref 70–99)
Glucose-Capillary: 91 mg/dL (ref 70–99)
Glucose-Capillary: 147 mg/dL — ABNORMAL HIGH (ref 70–99)
Glucose-Capillary: 120 mg/dL — ABNORMAL HIGH (ref 70–99)

## 2011-08-13 LAB — BASIC METABOLIC PANEL
Calcium: 8.8 mg/dL (ref 8.4–10.5)
Chloride: 102 mEq/L (ref 96–112)
Creatinine, Ser: 0.71 mg/dL (ref 0.50–1.10)
GFR calc Af Amer: >60 mL/min (ref >60)
GFR calc non Af Amer: >60 mL/min (ref >60)

## 2011-08-13 LAB — CBC
Hemoglobin: 8 g/dL — ABNORMAL LOW (ref 12.0–15.0)
MCHC: 32.3 g/dL (ref 30.0–36.0)
Platelets: 113 10*3/uL — ABNORMAL LOW (ref 150–400)

## 2011-08-13 LAB — CROSSMATCH

## 2011-08-13 LAB — PROTIME-INR
INR: 1.25 (ref 0.00–1.49)
Prothrombin Time: 16 seconds — ABNORMAL HIGH (ref 11.6–15.2)

## 2011-08-14 ENCOUNTER — Inpatient Hospital Stay (HOSPITAL_COMMUNITY): Payer: Medicare Other

## 2011-08-14 LAB — GLUCOSE, CAPILLARY
Glucose-Capillary: 111 mg/dL — ABNORMAL HIGH (ref 70–99)
Glucose-Capillary: 120 mg/dL — ABNORMAL HIGH (ref 70–99)
Glucose-Capillary: 102 mg/dL — ABNORMAL HIGH (ref 70–99)

## 2011-08-14 LAB — CBC
HCT: 25.1 % — ABNORMAL LOW (ref 36.0–46.0)
Hemoglobin: 8.3 g/dL — ABNORMAL LOW (ref 12.0–15.0)
RBC: 2.89 MIL/uL — ABNORMAL LOW (ref 3.87–5.11)
WBC: 6.5 10*3/uL (ref 4.0–10.5)

## 2011-08-14 LAB — TSH: TSH: 2.188 u[IU]/mL (ref 0.350–4.500)

## 2011-08-14 LAB — BASIC METABOLIC PANEL
BUN: 18 mg/dL (ref 6–23)
Chloride: 104 mEq/L (ref 96–112)
Glucose, Bld: 119 mg/dL — ABNORMAL HIGH (ref 70–99)
Potassium: 3.8 mEq/L (ref 3.5–5.1)
Sodium: 137 mEq/L (ref 135–145)

## 2011-08-16 LAB — GLUCOSE, CAPILLARY
Glucose-Capillary: 107 mg/dL — ABNORMAL HIGH (ref 70–99)
Glucose-Capillary: 107 mg/dL — ABNORMAL HIGH (ref 70–99)
Glucose-Capillary: 81 mg/dL (ref 70–99)

## 2011-08-17 LAB — GLUCOSE, CAPILLARY: Glucose-Capillary: 116 mg/dL — ABNORMAL HIGH (ref 70–99)

## 2011-08-18 LAB — GLUCOSE, CAPILLARY
Glucose-Capillary: 104 mg/dL — ABNORMAL HIGH (ref 70–99)
Glucose-Capillary: 102 mg/dL — ABNORMAL HIGH (ref 70–99)

## 2011-08-24 NOTE — Discharge Summary (Signed)
NAMEJERRIKA, [name]                ACCOUNT NO.:  [id number]  MEDICAL RECORD NO.:  [id number]  LOCATION:  2036                         FACILITY:  [facility]  PHYSICIAN:  Evelene Croon, M.D.     DATE OF BIRTH:  [birth year]  DATE OF ADMISSION:  07/30/2011 DATE OF DISCHARGE:  08/17/2011                              DISCHARGE SUMMARY   ADMITTING DIAGNOSES: 1. Multivessel coronary artery disease (with an ejection fraction of     40%). 2. Recently diagnosed atrial fibrillation (on Coumadin). 3. History of diabetes mellitus. 4. History of hyperlipidemia. 5. History of hypertension. 6. History of gastroesophageal reflux disease. 7. History of osteoporosis. 8. History of polyps (status post removal). 9. History of remote tobacco abuse.  DISCHARGE DIAGNOSES: 1. Multivessel coronary artery disease (with an ejection fraction of     40%). 2. Recently diagnosed atrial fibrillation (on Coumadin). 3. History of diabetes mellitus. 4. History of hyperlipidemia. 5. History of hypertension. 6. History of gastroesophageal reflux disease. 7. History of osteoporosis. 8. History of polyps (status post removal). 9. History of remote tobacco abuse. 10.Acute blood loss anemia. 11.Thrombocytopenia.  PROCEDURES: 1. 2-D echocardiogram done on August 01, 2011. 2. Cardiac catheterization performed by Dr. Katrinka Blazing on August 05, 2011. 3. Median sternotomy for coronary artery bypass graft x5 (left     internal mammary artery to left anterior descending, saphenous vein     graft to diagonal, saphenous vein graft sequentially to obtuse     marginal 1 and 2, and saphenous vein graft to the right coronary     artery with endovein harvest from the left thigh and calf by Dr.     Laneta Simmers on August 10, 2011. 4. Intraoperative TEE performed on August 10, 2011 by Dr. Gypsy Balsam.   HISTORY OF PRESENT ILLNESS:  This is an 77 year old Caucasian female who was recently found to be in atrial fibrillation.  She was  referred to Dr. Katrinka Blazing on July 27, 2011.  At that time, she was not complaining of any chest pain or shortness of breath.  She was started on metoprolol and Coumadin for her atrial fibrillation.  She was scheduled for a stress Myoview on August 02, 2011.  However, she then developed progressive shortness of breath with minimal exertion.  She presented to the emergency room on July 30, 2011 with complaints of progressive shortness of breath mostly occurring in the last week.  Upon her arrival to [facility] Emergency Room, she was found to have a BNP of 1879 and a troponin of 0.02.  Chest x-ray showed mild hyperinflation of prominent basilar interstitial densities with no airspace disease.  She then had a CT angio of the chest that showed no pulmonary embolism, there was cardiomegaly, and evidence of coronary artery disease with calcifications present.  In addition, there were some mild ground-glass opacities consistent with mild interstitial edema.  EKG that was done showed a left bundle-branch block.        She then underwent a 2-D echocardiogram on August 01, 2011 that showed the EF to be 50-55%, grade 2 diastolic dysfunction, aortic valve was poorly visualized, mild- to-moderate aortic stenosis, aortic valve area 1.02 cm2.  The leaflets were noted to be calcified and thickened and there was also mild-to- moderate AI as well as mild MR with a calcified mitral annulus.  There was no tricuspid or pulmonary regurgitation, however.  She then underwent a nuclear stress test which showed large area of scar involving inferolateral wall with peri-infarct ischemia in the inferior apical and septal walls.  The inferior lateral wall was also akinetic consistent with a scar, anterior EF was estimated to be 28%.  She then was scheduled for cardiac catheterization by Dr. Katrinka Blazing on July 05, 2011.  The patient was found to have multivessel coronary artery disease with an EF of 40%.   Specifically, she was found to have a total occlusion of the mid LAD, the proximal LAD had a 90% stenosis, the left circumflex had a 90% ostial stenosis followed by 70% segmental narrowing, and the right coronary artery had a tandem 90% midvessel stenosis and 95% stenosis in the distal vessel before the origin of the posterior descending coronary artery.     A cardiothoracic consultation was obtained with Dr. Laneta Simmers for the consideration of coronary artery bypass grafting surgery.  Potential risks, complications, and benefits of the surgery were discussed with the patient and she agreed to proceed.  She did undergo a carotid duplex carotid ultrasound prior to undergoing heart surgery.  There was no evidence of significant left internal carotid artery stenosis and the right internal carotid artery had approximate 40-59% stenosis.  BRIEF HOSPITAL COURSE STAY:  The patient was extubated in the evening of surgery without difficulty.  She remained afebrile and hemodynamically stable.  She initially did require AAI pacing and as well as Neo- Synephrine.  Swan-Ganz, A-line, chest tubes, and Foley were all removed earlier in her postoperative course.  She was eventually weaned off the Neo-Synephrine and a low-dose beta-blocker was started.  She then developed atrial fibrillation with rapid ventricular rate.  She was given amiodarone bolus and then placed on amiodarone drip.  She then converted to sinus rhythm.  She was placed on amiodarone 400 mg p.o. two times daily.  She then went back into atrial fibrillation, however, and was given an amiodarone bolus and her Lopressor was increased to 25 mg p.o. 2 times daily.  Once again, she converted to sinus rhythm where she currently remains.  As previously stated, the patient has a history of diabetes mellitus.  She was weaned off her insulin drip and restarted on metformin.  She was also started on 1 mg of Amaryl.  According to the office note from  [facility], she was on 4 mg p.o. daily.  Her Amaryl will be increased as her glucose continues to increase.  She was found to be volume overloaded and diuresed accordingly.  She was also found to have acute blood loss anemia.  Her last H and H was 8.3 and 25.1.  She did not require postoperative transfusion.  She also was found to have thrombocytopenia postoperatively.  Her platelet count went as low as 107,000, however, her last platelet count was up to 138,000. The patient was felt surgically stable for transfer from the Intensive Care Unit to 2000 for further convalescence on August 15, 2011. Currently, on postop day #6, she is afebrile, heart rate in the 60s, she is in sinus rhythm (BP 132/81), O2 sat 97% on room air.  She has already been tolerating a diet and has had a bowel movement and she is going to be evaluated by PT and OT today to  assist with her ambulation.  PHYSICAL EXAMINATION:  CARDIOVASCULAR:  Regular rate and rhythm. PULMONARY:  Slight decreased at the bases. ABDOMEN:  Soft and nontender.  Bowel sounds present.  Some distention. EXTREMITIES:  2+ lower extremity edema.  Her sternal and lower extremity wounds are clean, dry, and continuing to heal.  Provided she remains afebrile, hemodynamically stable, and pending morning round evaluation, she will be surgically stable for discharge to SNF on August 17, 2011.  LATEST LABORATORY STUDIES:  As follows, TSH on August 14, 2011 is 2.188.  BMET on this date, potassium 3.8, sodium 137, BUN and creatinine 18 and 0.64 respectively.  CBC on this date, H and H 8.3 and 25.1, white count 6500, and platelet count 138,000.  Chest x-ray done on this date on August 14, 2011, shows small bilateral pleural effusions with bibasilar atelectasis, no pneumothorax.  DISCHARGE INSTRUCTIONS:  Include the following:  DIET:  Low-sodium, heart-healthy, diabetic diet.  ACTIVITY:  The patient is to increase her activity slowly.   She may walk up steps.  She may shower.  She is not to lift more than 10 pounds for 4 weeks and not to drive until after 4 weeks.  She is to continue with her breathing exercise daily.  She is to walk and increase her frequency and duration as she tolerates.  WOUND CARE:  The patient is to use soap and water on her wounds.  She is to contact the office if any wound problems arise.  FOLLOWUP APPOINTMENTS: 1. The patient is to contact Dr. Michaelle Copas office for a followup     appointment 2 weeks. 2. The patient needs to contact Dr. Alver Fisher office for a followup     appointment regarding further diabetes management. 3. The patient has an appointment to see Dr. Laneta Simmers physician's     assistant on September 11, 2011 at 1 p.m. 45 minutes.  Prior to this     office appointment, a chest x-ray should be obtained.  MEDICATIONS AT THE TIME OF DICTATION: 1. Amiodarone 400 mg p.o. 2 times daily for 2 days, then 200 mg p.o. 2     times daily thereafter. 2. Enteric-coated aspirin 325 mg p.o. daily. 3. Lasix 40 mg p.o. daily x7 days. 4. Potassium chloride 20 mEq p.o. daily x7 days. 5. Amaryl 1 mg p.o. daily before breakfast, this is to be increased to     her preop dose of 4 mg p.o. daily before breakfast after glucose     continues to increase. 6. Sliding scale as printed on discharge sheet. 7. Lopressor 25 mg p.o. 2 times daily. 8. Ultram 50 mg 1-2 tablets p.o. q.46 hours p.r.n. pain. 9. Calcium p.o. q.a.m. 10.Crestor 20 mg p.o. q.a.m. 11.Metformin 500 mg p.o. 2 times daily. 12.Vitamin B12 p.o. q.a.m.  Please note patient was not placed on an ACE or ARB as blood pressure was well controlled with Lopressor.   Doree Fudge, PA   ______________________________ Evelene Croon, M.D.    DZ/MEDQ  D:  08/16/2011  T:  08/16/2011  Job:  [id number]  cc:   Kari Baars, M.D. Lyn Records, M.D.  Electronically Signed by Doree Fudge PA on 08/17/2011 08:02:31 AM Electronically Signed by  Evelene Croon M.D. on 08/24/2011 04:29:19 PM

## 2011-08-24 NOTE — Discharge Summary (Signed)
  NAMESONYA, [name]                ACCOUNT NO.:  [id number]  MEDICAL RECORD NO.:  [id number]  LOCATION:  2036                         FACILITY:  [facility]  PHYSICIAN:  Evelene Croon, M.D.     DATE OF BIRTH:  August 26, 1926  DATE OF ADMISSION:  07/30/2011 DATE OF DISCHARGE:                              DISCHARGE SUMMARY   ADDENDUM:  The patient with anticipation transferred to skilled nursing facility.  She was ready for transfer on August 17, 2011.  At that time, the patient was awaiting bed offers.  On August 17, 2011, the patient was noted to be in normal sinus rhythm.  She had no further atrial fibrillation up to that day.  Dr. Katrinka Blazing decreased her amiodarone to 200 mg b.i.d.  Following day on August 18, 2011, the patient noted to have recurrent rate-controlled atrial fibrillation.  She was continued on the amiodarone as well as Lopressor.  Due to the patient's recurrent rate-controlled atrial fibrillation, it was felt that she would benefit from Coumadin.  This was started on August 18, 2011.  Plan will be to start the patient on low-dose Coumadin at 2.5 mg at night.  PT/INR level will need to be drawn at the skilled nursing facility on August 21, 2011 and faxed to Dr. Michaelle Copas office for management of Coumadin dosage.  The patient on August 18, 2011 was noted to be afebrile.  Blood pressure stable.  O2 sats were greater than 90% on room air.  Incisions were clean, dry, and intact and healing well.  She continued to progress well with ambulation.  She was tolerating diet well.  The patient was seen by Dr. Laneta Simmers on August 18, 2011 and felt that the patient is ready for transfer to skilled nursing facility today.  Please see dictated discharge summary for followup appointments and discharge instructions.  DISCHARGE MEDICATIONS: 1. Amiodarone 200 mg b.i.d. 2. Enteric-coated aspirin 325 mg daily. 3. Coumadin 2.5 mg at night. 4. Lasix 40 mg daily x7 days. 5.  Amaryl 1 mg daily before breakfast and increased to 4 mg as blood     sugars increased. 6. Sliding scale insulin. 7. Lopressor 25 mg b.i.d. 8. Potassium chloride 20 mEq daily x7 days. 9. Ultram 50 mg 1-2 tablets q.4-6 hours p.r.n. pain. 10.Calcium daily. 11.Crestor 20 mg daily. 12.Metformin 500 mg b.i.d. 13.Vitamin B12 daily.     Sol Blazing, PA   ______________________________ Evelene Croon, M.D.    KMD/MEDQ  D:  08/18/2011  T:  08/18/2011  Job:  [id number]  Electronically Signed by Cameron Proud PA on 08/22/2011 11:24:29 AM Electronically Signed by Evelene Croon M.D. on 08/24/2011 56:21:30 PM

## 2011-08-24 NOTE — Op Note (Signed)
[name], [name]                ACCOUNT NO.:  [id number]  MEDICAL RECORD NO.:  [id number]  LOCATION:  2309                         FACILITY:  [facility]  PHYSICIAN:  Evelene Croon, M.D.     DATE OF BIRTH:  12/15/1925  DATE OF PROCEDURE:  08/10/2011 DATE OF DISCHARGE:                              OPERATIVE REPORT   PREOPERATIVE DIAGNOSIS:  Severe three-vessel coronary artery disease.  POSTOPERATIVE DIAGNOSIS:  Severe three-vessel coronary artery disease.  OPERATIVE PROCEDURE:  Median sternotomy, extracorporeal circulation, coronary artery bypass graft surgery x5 using a left internal mammary artery graft to the left anterior descending coronary artery, with a saphenous vein graft to diagonal branch of the LAD, a sequential saphenous vein graft to the first and second obtuse marginal branches of the left circumflex coronary artery, and a saphenous vein graft to the right coronary artery.  Endoscopic vein harvesting from the left leg.  SURGEON:  Evelene Croon, MD  ASSISTANT:  Gershon Crane, PA-C  ANESTHESIA:  General endotracheal.  CLINICAL HISTORY:  This patient is an 77 year old woman who was recently found to be in atrial fibrillation during a visit with her primary physician.  She was referred to Dr. Verdis Prime on July 27, 2011, and was started on metoprolol and Coumadin for atrial fibrillation.  She was also scheduled for a stress Myoview examination on August 02, 2011. After that visit, she had recurrent episodes of progressive shortness of breath with minimal exertion.  She presented to the emergency room with an elevated BMP of 1879 with mildly elevated troponin of 0.02.  CT angiogram of the chest showed no evidence of pulmonary embolism but did show cardiomegaly and evidence of coronary artery disease with calcifications present in the coronary arteries.  There was some mild ground-glass opacities consistent with mild interstitial edema.  She underwent a 2-D  echocardiogram on August 01, 2011, which was not felt to be an ideal study but did show a left ventricular ejection fraction of 50-55% with grade 2 diastolic dysfunction.  The aortic valve was poorly visualized but was felt to have mild-to-moderate aortic stenosis by gradient.  The mean gradient was noted to be 13 and the peak gradient 23.  Aortic valve area was 1.02 cm2 by PTI and 1.21 cm2 by Bmax.  The leaflets were calcified and thickened.  There was also a mild-to- moderate aortic insufficiency and mild mitral regurgitation with calcified mitral annulus.  She subsequently underwent a nuclear stress test which showed large area of scar involving the inferolateral wall with peri-infarct ischemia in the inferoapical and inferoseptal walls. The inferolateral wall was akinetic consistent with scar.  Ejection fraction measured 28% during that study.  She underwent cardiac catheterization which showed severe three-vessel disease.  There was total occlusion of the mid LAD after the origin of the first septal perforator and an enlarged first diagonal branch.  The diagonal supplied collaterals around the left ventricular apex of the distal LAD.  The LAD proximally had about 90% stenosis before this large diagonal branch. Left circumflex had an ostial 90% stenosis, followed by segmental 70% narrowing.  There was a large first marginal.  It gave collaterals to a moderate-sized second marginal branch  that was totally occluded.  The right coronary artery had three severe stenoses with tandem 90% midvessel stenosis and 95% stenosis in the distal vessel before the origin of the posterior descending branch.  The aortic valve was not crossed at the time of catheterization.  After review of the catheterization and echocardiogram, I felt the best treatment would be coronary artery bypass graft surgery with intraoperative examination of the aortic valve using TEE.  I discussed the operative procedure  of coronary artery bypass graft surgery and possible aortic valve replacement with the patient and her family.  We discussed alternatives, benefits, and risks including but not limited to bleeding, blood transfusion, infection, stroke, myocardial infarction, graft failure, and death.  I also discussed the possibility that her aortic valve was not diseased enough to replace that she could develop progressive aortic stenosis in the future.  They understood all this and agreed to proceed.  OPERATIVE PROCEDURE:  The patient was taken to the operating room and placed on the table in supine position.  After induction of general endotracheal anesthesia, a Foley catheter was placed in the bladder using sterile technique.  Then the chest, abdomen and both lower extremities were prepped and draped in usual sterile manner. Transesophageal echocardiogram was performed by Anesthesiology.  This showed very mild aortic stenosis.  The aortic valve leaflets appeared thickened but not particularly calcified and moved well.  There was minimal gradient measured across the aortic valve.  Aortic valve area was calculated about 1.5 cm2.  There was mild central aortic insufficiency.  There was mild mitral regurgitation with a narrow jet. Left ventricular function appeared well-preserved.  Right heart function appeared normal.  After review of this, I did not feel that aortic valve replacement was warranted.  A prosthetic valve in this small lady would most likely have a higher gradient that she has now and a smaller valve area and the only benefit would be potentially avoiding progressive aortic stenosis in the future.  It is also possible that she could calcify a prosthetic valve more rapidly and end up with a worse problem in the future.  Therefore, I decided to leave her aortic valve alone.  Then, the chest was entered through a median sternotomy incision.  The pericardium was opened in the midline.   Examination of the heart showed good ventricular contractility.  The ascending aorta had no palpable plaques in it.  It was relatively a small aorta.  Then, the left internal mammary artery was harvested from the chest wall as a pedicle graft.  This was a medium caliber vessel with excellent blood flow through it.  At the same time, a segment of greater saphenous vein was harvested from the left leg using endoscopic vein harvest technique.  We initially examined the vein in the right leg and this vein was very small was therefore not harvested.  Then, the patient was heparinized and when an adequate ACT was obtained, the distal ascending aorta was cannulated using a 20-French aortic cannula for arterial inflow.  Venous outflow was achieved using two- stage venous cannula for the right atrial appendage.  An antegrade cardioplegia and vent cannula were inserted in the aortic root.  The patient was placed on cardiopulmonary bypass and distal coronaries were identified.  The LAD was diffusely diseased with calcific plaque and this was almost concentric and extended out to the apex of the heart.  There was one area in the midportion that was soft enough to graft anteriorly.  The diagonal branch  was a large graftable vessel with proximal disease but the distal vessel had no disease in it.  The first marginal branch was a large graftable vessel with mild disease.  The second marginal was a smaller vessel which was borderline and graftable. The right coronary artery was diffusely diseased with calcific plaque. There was an area just before the take off of the posterior descending branch which appeared soft and not to graft.  Then, the aorta was crossclamped and 1000 mL of cold blood antegrade cardioplegia was administered in the aortic root with quick arrest of the heart.  Systemic hypothermia to 20 degrees centigrade and topical hypothermic iced saline was used.  A temperature probe was  placed in the septum, insulating pad in the pericardium.  The first distal anastomosis was performed in the distal right coronary artery.  The internal diameter of this vessel was about 2 mm.  The conduit used was a segment of greater saphenous vein and the anastomosis performed in an end-to-side manner using continuous 7-0 Prolene suture. Flow was noted through the graft and was excellent.  The second distal anastomosis was performed to the first marginal branch.  The internal diameter of this vessel was about 1.75 mm.  The conduit used was a second segment of greater saphenous vein and the anastomosis performed in a sequential side-to-side manner using continuous 7-0 Prolene suture.  Flow was noted through the graft and was excellent.  The third distal anastomosis was performed to the second marginal branch.  The internal diameter of this vessel was about 1.5 mm.  The conduit used was the same segment of greater saphenous vein and the anastomosis performed in a sequential end-to-side manner using continuous 7-0 Prolene suture.  Flow was noted through the graft and was excellent.  Then, dose of cardioplegia was given down the vein grafts and aortic root.  The fourth distal anastomosis was performed to the diagonal branch.  The internal diameter of this vessel was about 1.75 mm.  The conduit used was a third segment of greater saphenous vein and the anastomosis performed in an end-to-side manner using continuous 7-0 Prolene suture. Flow was noted through the graft was excellent.  The fifth distal anastomosis was performed in the mid LAD.  The internal diameter of this vessel was about 1.6 mm.  I could not get a 1-mm probe pass into the distal vessel due to the severe calcification and immobility of the vessel wall.  The conduit used was a left internal mammary pedicle and this was brought through an opening in the left pericardium anterior to the phrenic nerve.  It was anastomosed  to the LAD in an end-to-side manner using continuous 8-0 Prolene suture.  The pedicle was sutured to the epicardium with 6-0 Prolene sutures.  Then, another dose of cardioplegia was given.  With a crossclamp in place, the three proximal vein graft anastomoses were performed to the mid ascending aorta in an end-to-side manner using continuous 6-0 Prolene suture.  Then, the clamp was removed from the mammary pedicle. There was rapid warming of the ventricular septum and return of spontaneous ventricular fibrillation.  The crossclamp was removed with a time of 100 minutes.  There was spontaneous return of sinus rhythm.  The proximal and distal anastomoses appeared hemostatic while the grafts satisfactory.  Graft markers were placed around the proximal anastomoses.  Two temporary right ventricular and right atrial pacing wires were placed above through the skin.  When the patient had rewarmed to 37 degrees centigrade,  she was weaned from cardiopulmonary bypass on no inotropic agents.  Total bypass time was 116 minutes.  Cardiac function appeared excellent.  Cardiac output of 5 liters per minute.  Protamine was given and the venous and aortic cannulae were removed without difficulty.  Hemostasis was achieved. Three chest tubes were placed with 2 in the posterior pericardium, 1 in left pleural space and 1 in anterior mediastinum.  The sternum was closed with #6 stainless steel wires.  Fascia was closed with continuous #1 Vicryl suture.  Subcutaneous tissue was closed with continuous 2-0 Vicryl and the skin with 3-0 Vicryl subcuticular closure.  Sponge, needle and instrument counts were correct according to scrub nurse.  Dry sterile dressing was applied over the incisions around the chest tubes which were Pleur-Evac suctioned.  The patient remained hemodynamically stable, was transferred to the SICU in guarded but stable condition.    Evelene Croon, M.D.    BB/MEDQ  D:  08/10/2011  T:   08/10/2011  Job:  [id number]  cc:   Lyn Records, M.D.  Electronically Signed by Evelene Croon M.D. on 08/24/2011 04:29:16 PM

## 2011-09-07 ENCOUNTER — Other Ambulatory Visit: Payer: Self-pay | Admitting: Surgery

## 2011-09-07 DIAGNOSIS — I2510 Atherosclerotic heart disease of native coronary artery without angina pectoris: Secondary | ICD-10-CM

## 2011-09-08 ENCOUNTER — Encounter: Payer: Self-pay | Admitting: *Deleted

## 2011-09-08 ENCOUNTER — Other Ambulatory Visit: Payer: Self-pay | Admitting: Surgery

## 2011-09-08 DIAGNOSIS — I454 Nonspecific intraventricular block: Secondary | ICD-10-CM | POA: Insufficient documentation

## 2011-09-08 DIAGNOSIS — E785 Hyperlipidemia, unspecified: Secondary | ICD-10-CM | POA: Insufficient documentation

## 2011-09-08 DIAGNOSIS — I2510 Atherosclerotic heart disease of native coronary artery without angina pectoris: Secondary | ICD-10-CM

## 2011-09-08 DIAGNOSIS — K219 Gastro-esophageal reflux disease without esophagitis: Secondary | ICD-10-CM | POA: Insufficient documentation

## 2011-09-08 DIAGNOSIS — E119 Type 2 diabetes mellitus without complications: Secondary | ICD-10-CM | POA: Insufficient documentation

## 2011-09-08 DIAGNOSIS — I10 Essential (primary) hypertension: Secondary | ICD-10-CM | POA: Insufficient documentation

## 2011-09-11 ENCOUNTER — Ambulatory Visit (INDEPENDENT_AMBULATORY_CARE_PROVIDER_SITE_OTHER): Payer: Self-pay | Admitting: Surgical

## 2011-09-11 ENCOUNTER — Ambulatory Visit
Admission: RE | Admit: 2011-09-11 | Discharge: 2011-09-11 | Disposition: A | Discharge status: Home / Self Care | Payer: Medicare Other | Source: Ambulatory Visit | Attending: Surgery | Admitting: Surgery

## 2011-09-11 VITALS — BP 147/78 | HR 62 | Resp 18 | Ht 60.0 in | Wt 164.0 lb

## 2011-09-11 DIAGNOSIS — J90 Pleural effusion, not elsewhere classified: Secondary | ICD-10-CM

## 2011-09-11 DIAGNOSIS — I2510 Atherosclerotic heart disease of native coronary artery without angina pectoris: Secondary | ICD-10-CM

## 2011-09-11 DIAGNOSIS — Z951 Presence of aortocoronary bypass graft: Secondary | ICD-10-CM

## 2011-09-11 MED ORDER — FUROSEMIDE 40 MG PO TABS: 40.0000 mg | ORAL_TABLET | Freq: Every day | ORAL | Status: DC

## 2011-09-11 MED ORDER — POTASSIUM CHLORIDE ER 10 MEQ PO TBCR: 20.0000 meq | EXTENDED_RELEASE_TABLET | ORAL | Status: DC

## 2011-09-11 NOTE — Progress Notes (Signed)
  HPI: Patient returns for routine postoperative follow-up having undergone coronary artery bypass graft on 08/10/2011. This was done for severe three-vessel coronary artery disease by Dr. Laneta Simmers. Daily the patient feels as though she is doing well. He does have some shortness of breath with activity. She denies significant difficulties with pain. He has had no particular difficulty with her incisions. Her ambulation is improving. She does not drive a car so returning to driving not an issue.    Current Outpatient Prescriptions  Medication Sig Dispense Refill  . amiodarone (PACERONE) 200 MG tablet Take 200 mg by mouth 2 (two) times daily.        . calcium carbonate (OS-CAL) 600 MG TABS Take 600 mg by mouth daily.        Marland Kitchen glimepiride (AMARYL) 1 MG tablet Take 1 mg by mouth daily before breakfast.        . metFORMIN (GLUCOPHAGE) 500 MG tablet Take 500 mg by mouth 2 (two) times daily with a meal.        . metoprolol tartrate (LOPRESSOR) 25 MG tablet Take 25 mg by mouth 2 (two) times daily.        . rosuvastatin (CRESTOR) 20 MG tablet Take 20 mg by mouth daily.        . traMADol (ULTRAM) 50 MG tablet Take 50 mg by mouth every 4 (four) hours as needed.        . vitamin B-12 (CYANOCOBALAMIN) 250 MCG tablet Take 250 mcg by mouth daily.        Marland Kitchen warfarin (COUMADIN) 2.5 MG tablet Take 2.5 mg by mouth daily. OR AS DIRECTED       . aspirin 325 MG EC tablet Take 325 mg by mouth daily.        . furosemide (LASIX) 40 MG tablet Take 1 tablet (40 mg total) by mouth daily.  14 tablet  0  . insulin aspart (NOVOLOG) 100 UNIT/ML injection Inject into the skin 3 (three) times daily before meals. SLIDING SCALE       . potassium chloride (K-DUR) 10 MEQ tablet Take 2 tablets (20 mEq total) by mouth 1 day or 1 dose.  14 tablet  0    Physical Exam: General appearance: Well-developed elderly female in no acute distress Pulmonary examination: Diminished breath sounds bilateral bases. Cardiac exam: Regular rate and  rhythm normal S1-S2 1/6 systolic murmur. Extremities bilateral lower extremity pitting edema 2+ Incisions: Incisions are all healing well without evidence of infection.  Diagnostic Tests: A chest x-ray was obtained on today's date. It reveals small bilateral pleural effusions. There is also some mild atelectatic changes. There is mild pulmonary vascular congestion.  Impression: The patient is making excellent recovery following her coronary surgical revascularization. I will start her on a course of 14 days of Lasix 40 mg by mouth daily. Additionally she'll receive a prescription for potassium chloride 20 mEq by mouth daily. This will also be for 14 days. The patient has already been seen by Dr. Katrinka Blazing her cardiologist last week.   Plan: We will plan to see the patient again in 2 weeks with a repeat chest x-ray. She was he is prior to that when necessary. Of note the patient did have newly diagnosed atrial fibrillation during her hospitalization. Dr. Katrinka Blazing was managing her amiodarone dosing. Additionally he will be managing her Coumadin.

## 2011-09-11 NOTE — Patient Instructions (Signed)
Follow up with Dr Laneta Simmers or PA in 2 weeks with Chest Xray

## 2011-09-19 ENCOUNTER — Other Ambulatory Visit: Payer: Self-pay | Admitting: Surgery

## 2011-09-19 DIAGNOSIS — I2510 Atherosclerotic heart disease of native coronary artery without angina pectoris: Secondary | ICD-10-CM

## 2011-09-26 ENCOUNTER — Ambulatory Visit: Payer: Self-pay | Admitting: Surgery

## 2011-10-11 ENCOUNTER — Other Ambulatory Visit: Payer: Self-pay | Admitting: Surgery

## 2011-10-11 DIAGNOSIS — I2510 Atherosclerotic heart disease of native coronary artery without angina pectoris: Secondary | ICD-10-CM

## 2011-10-17 ENCOUNTER — Ambulatory Visit: Payer: Self-pay | Admitting: Surgery

## 2011-12-01 ENCOUNTER — Other Ambulatory Visit: Payer: Self-pay | Admitting: Surgery

## 2011-12-01 DIAGNOSIS — I2510 Atherosclerotic heart disease of native coronary artery without angina pectoris: Secondary | ICD-10-CM

## 2011-12-05 ENCOUNTER — Ambulatory Visit: Payer: Medicare Other | Admitting: Surgery

## 2011-12-07 ENCOUNTER — Other Ambulatory Visit: Payer: Self-pay | Admitting: Surgery

## 2011-12-07 DIAGNOSIS — I2510 Atherosclerotic heart disease of native coronary artery without angina pectoris: Secondary | ICD-10-CM

## 2011-12-12 ENCOUNTER — Ambulatory Visit
Admission: RE | Admit: 2011-12-12 | Discharge: 2011-12-12 | Disposition: A | Discharge status: Home / Self Care | Payer: Medicare Other | Source: Ambulatory Visit | Attending: Surgery | Admitting: Surgery

## 2011-12-12 ENCOUNTER — Encounter: Payer: Self-pay | Admitting: Surgery

## 2011-12-12 ENCOUNTER — Ambulatory Visit (INDEPENDENT_AMBULATORY_CARE_PROVIDER_SITE_OTHER): Payer: Medicare Other | Admitting: Surgery

## 2011-12-12 VITALS — BP 131/74 | HR 88 | Resp 16 | Ht 64.0 in | Wt 157.0 lb

## 2011-12-12 DIAGNOSIS — I2510 Atherosclerotic heart disease of native coronary artery without angina pectoris: Secondary | ICD-10-CM

## 2011-12-12 DIAGNOSIS — Z09 Encounter for follow-up examination after completed treatment for conditions other than malignant neoplasm: Secondary | ICD-10-CM

## 2011-12-12 NOTE — Progress Notes (Signed)
[address]            [place] [address]          [phone]      HPI:  Patient returns for routine postoperative follow-up having undergone coronary bypass graft surgery x5  on 08/10/2011. The patient's early postoperative recovery while in the hospital was notable for an uncomplicated postoperative course. She did have postoperative atrial fibrillation and was discharged home on amiodarone and Coumadin. These have subsequently been stopped. Since hospital discharge the patient reports she has been feeling well. She is walking daily without chest pain or shortness of breath..   Current Outpatient Prescriptions  Medication Sig Dispense Refill  . aspirin 81 MG tablet Take 81 mg by mouth daily.      . calcium carbonate (OS-CAL) 600 MG TABS Take 600 mg by mouth daily.        . furosemide (LASIX) 40 MG tablet Take 1 tablet (40 mg total) by mouth daily.  14 tablet  0  . glimepiride (AMARYL) 1 MG tablet Take 4 mg by mouth daily before breakfast.       . lisinopril-hydrochlorothiazide (PRINZIDE,ZESTORETIC) 20-25 MG per tablet Take 1 tablet by mouth daily.      . metFORMIN (GLUCOPHAGE) 500 MG tablet Take 500 mg by mouth 2 (two) times daily with a meal.        . metoprolol tartrate (LOPRESSOR) 25 MG tablet Take 50 mg by mouth 2 (two) times daily. 1/2 in the am....1 in the pm      . rosuvastatin (CRESTOR) 20 MG tablet Take 20 mg by mouth daily.        Marland Kitchen amiodarone (PACERONE) 200 MG tablet Take 200 mg by mouth 2 (two) times daily.        . furosemide (LASIX) 40 MG tablet Take 1 tablet (40 mg total) by mouth daily.  14 tablet  0  . furosemide (LASIX) 40 MG tablet Take 1 tablet (40 mg total) by mouth daily.  14 tablet  0  . insulin aspart (NOVOLOG) 100 UNIT/ML injection Inject into the skin 3 (three) times daily before meals. SLIDING SCALE       . traMADol (ULTRAM) 50 MG tablet Take 50 mg by mouth every 4 (four) hours as needed.        . vitamin B-12  (CYANOCOBALAMIN) 250 MCG tablet Take 250 mcg by mouth daily.        Marland Kitchen warfarin (COUMADIN) 2.5 MG tablet Take 2.5 mg by mouth daily. OR AS DIRECTED         Physical Exam: BP 131/74  Pulse 88  Resp 16  Ht 5\' 4"  (1.626 m)  Wt 157 lb (71.215 kg)  BMI 26.95 kg/m2  SpO2 97%  She looks well. Cardiac exam shows a regular rate and rhythm with a grade 2/6 systolic murmur over the aorta. Lung exam is clear. Chest incision is healing well and the sternum is stable. Both leg incisions are healing well and there is no peripheral edema.  Diagnostic Tests:  Followup chest x-ray today shows clear lung fields and no pleural effusions.  Impression:  Overall she continues to do well following coronary bypass graft surgery. I told her she can return to normal activity without restriction.  Plan:  She will continue to followup with Dr. Clelia Croft and Dr. Katrinka Blazing. She will contact me if she develops any problems  with her incisions.

## 2012-01-17 ENCOUNTER — Other Ambulatory Visit: Payer: Self-pay

## 2012-01-17 ENCOUNTER — Emergency Department (HOSPITAL_COMMUNITY)
Admission: EM | Admit: 2012-01-17 | Discharge: 2012-01-17 | Disposition: A | Discharge status: Home Health | Payer: Medicare Other | Attending: Emergency Medicine | Admitting: Emergency Medicine

## 2012-01-17 ENCOUNTER — Emergency Department (HOSPITAL_COMMUNITY): Payer: Medicare Other

## 2012-01-17 ENCOUNTER — Encounter (HOSPITAL_COMMUNITY): Payer: Self-pay | Admitting: *Deleted

## 2012-01-17 DIAGNOSIS — R1013 Epigastric pain: Secondary | ICD-10-CM | POA: Insufficient documentation

## 2012-01-17 DIAGNOSIS — Z7982 Long term (current) use of aspirin: Secondary | ICD-10-CM | POA: Insufficient documentation

## 2012-01-17 DIAGNOSIS — R0602 Shortness of breath: Secondary | ICD-10-CM | POA: Insufficient documentation

## 2012-01-17 DIAGNOSIS — I447 Left bundle-branch block, unspecified: Secondary | ICD-10-CM | POA: Insufficient documentation

## 2012-01-17 DIAGNOSIS — E119 Type 2 diabetes mellitus without complications: Secondary | ICD-10-CM | POA: Insufficient documentation

## 2012-01-17 DIAGNOSIS — Z79899 Other long term (current) drug therapy: Secondary | ICD-10-CM | POA: Insufficient documentation

## 2012-01-17 DIAGNOSIS — E785 Hyperlipidemia, unspecified: Secondary | ICD-10-CM | POA: Insufficient documentation

## 2012-01-17 DIAGNOSIS — M810 Age-related osteoporosis without current pathological fracture: Secondary | ICD-10-CM | POA: Insufficient documentation

## 2012-01-17 DIAGNOSIS — I10 Essential (primary) hypertension: Secondary | ICD-10-CM | POA: Insufficient documentation

## 2012-01-17 DIAGNOSIS — K219 Gastro-esophageal reflux disease without esophagitis: Secondary | ICD-10-CM | POA: Insufficient documentation

## 2012-01-17 DIAGNOSIS — I509 Heart failure, unspecified: Secondary | ICD-10-CM | POA: Insufficient documentation

## 2012-01-17 LAB — CBC
HCT: 38 % (ref 36.0–46.0)
Hemoglobin: 12.5 g/dL (ref 12.0–15.0)
MCHC: 32.9 g/dL (ref 30.0–36.0)
WBC: 5.4 10*3/uL (ref 4.0–10.5)

## 2012-01-17 LAB — DIFFERENTIAL
Lymphocytes Relative: 23 % (ref 12–46)
Monocytes Absolute: 0.4 10*3/uL (ref 0.1–1.0)
Monocytes Relative: 8 % (ref 3–12)
Neutro Abs: 3.6 10*3/uL (ref 1.7–7.7)

## 2012-01-17 LAB — TROPONIN I
Troponin I: <0.3 ng/mL (ref <0.30)
Troponin I: <0.3 ng/mL (ref <0.30)

## 2012-01-17 LAB — URINALYSIS, ROUTINE W REFLEX MICROSCOPIC
Bilirubin Urine: NEGATIVE
Ketones, ur: NEGATIVE mg/dL
Nitrite: NEGATIVE
Urobilinogen, UA: 0.2 mg/dL (ref 0.0–1.0)

## 2012-01-17 LAB — COMPREHENSIVE METABOLIC PANEL
BUN: 13 mg/dL (ref 6–23)
CO2: 29 mEq/L (ref 19–32)
Chloride: 101 mEq/L (ref 96–112)
Creatinine, Ser: 0.85 mg/dL (ref 0.50–1.10)
GFR calc Af Amer: 71 mL/min — ABNORMAL LOW (ref >90)
GFR calc non Af Amer: 62 mL/min — ABNORMAL LOW (ref >90)
Total Bilirubin: 0.5 mg/dL (ref 0.3–1.2)

## 2012-01-17 LAB — LIPASE, BLOOD: Lipase: 27 U/L (ref 11–59)

## 2012-01-17 LAB — URINE MICROSCOPIC-ADD ON

## 2012-01-17 MED ORDER — SODIUM CHLORIDE 0.9 % IV SOLN
INTRAVENOUS | Status: DC
  Administered 2012-01-17: 125 mL/h via INTRAVENOUS

## 2012-01-17 NOTE — ED Provider Notes (Addendum)
History     CSN: [id number]  Arrival date & time 01/17/12  4098   First MD Initiated Contact with Patient 01/17/12 [phone]      Chief Complaint  Patient presents with  . Abdominal Pain    (Consider location/radiation/quality/duration/timing/severity/associated sxs/prior treatment) Patient is a 78 y.o. female presenting with abdominal pain. The history is provided by the patient.  Abdominal Pain The primary symptoms of the illness include abdominal pain and shortness of breath. The primary symptoms of the illness do not include nausea, vomiting or diarrhea.  Symptoms associated with the illness do not include back pain.   patient has had epigastric pain in her upper abdomen and lower chest. Began yesterday and came back today. Feels sore. Slight shortness of breath. She's had some hard stool. No fevers. She had a CABG in September. No fevers. No cough. No diaphoresis. She states that before her CABG she was having some pains like this. She's been doing well the last couple days. The pain is not any worse with exertion.   Past Medical History  Diagnosis Date  . Diabetes mellitus     TYPE 2  . Hypertension   . Hyperlipidemia   . Osteoporosis   . GERD (gastroesophageal reflux disease)   . CHF (congestive heart failure)   . BBB (bundle branch block)     CHRONIC  LEFT BBB    Past Surgical History  Procedure Date  . Cholecystectomy   . Abdominal hysterectomy   . Colonoscopy w/ polypectomy 7/2OO8       05/2010    NEOPLASTIC  POLYPS  . Cabg x 5 08/10/2011    DR BARTLE  . Coronary artery bypass graft 2012    Family History  Problem Relation Age of Onset  . Hypertension Mother   . Heart disease Father     History  Substance Use Topics  . Smoking status: Former Games developer  . Smokeless tobacco: Former Neurosurgeon    Quit date: 11/27/1980  . Alcohol Use: No    OB History    Grav Para Term Preterm Abortions TAB SAB Ect Mult Living                  Review of Systems  Constitutional:  Negative for activity change and appetite change.  HENT: Negative for neck stiffness.   Eyes: Negative for pain.  Respiratory: Positive for shortness of breath. Negative for chest tightness.   Cardiovascular: Positive for chest pain. Negative for leg swelling.  Gastrointestinal: Positive for abdominal pain. Negative for nausea, vomiting and diarrhea.  Genitourinary: Negative for flank pain.  Musculoskeletal: Negative for back pain.  Skin: Negative for rash.  Neurological: Negative for weakness, numbness and headaches.  Psychiatric/Behavioral: Negative for behavioral problems.    Allergies  Review of patient's allergies indicates no known allergies.  Home Medications   Current Outpatient Rx  Name Route Sig Dispense Refill  . ASPIRIN 81 MG PO TABS Oral Take 81 mg by mouth daily.    Marland Kitchen CALCIUM CARBONATE 600 MG PO TABS Oral Take 600 mg by mouth daily.      Marland Kitchen GLIMEPIRIDE 4 MG PO TABS Oral Take 4 mg by mouth daily before breakfast.    . LISINOPRIL-HYDROCHLOROTHIAZIDE 20-25 MG PO TABS Oral Take 1 tablet by mouth daily.    Marland Kitchen METFORMIN HCL 500 MG PO TABS Oral Take 500 mg by mouth 2 (two) times daily with a meal.      . METOPROLOL TARTRATE 25 MG PO TABS Oral Take  50 mg by mouth 2 (two) times daily.     Marland Kitchen ROSUVASTATIN CALCIUM 20 MG PO TABS Oral Take 20 mg by mouth daily.      Marland Kitchen VITAMIN B-12 250 MCG PO TABS Oral Take 250 mcg by mouth daily.        BP 134/56  Pulse 66  Temp(Src) 97.9 F (36.6 C) (Oral)  Resp 16  SpO2 100%  Physical Exam  Nursing note and vitals reviewed. Constitutional: She is oriented to person, place, and time. She appears well-developed and well-nourished.  HENT:  Head: Normocephalic and atraumatic.  Eyes: EOM are normal. Pupils are equal, round, and reactive to light.  Neck: Normal range of motion. Neck supple.  Cardiovascular: Normal rate, regular rhythm and normal heart sounds.   No murmur heard. Pulmonary/Chest: Effort normal and breath sounds normal. No  respiratory distress. She has no wheezes. She has no rales.  Abdominal: Soft. Bowel sounds are normal. She exhibits no distension. There is tenderness. There is no rebound and no guarding.       Mild epigastric tenderness without rebound or guarding  Musculoskeletal: Normal range of motion.  Neurological: She is alert and oriented to person, place, and time. No cranial nerve deficit.  Skin: Skin is warm and dry.  Psychiatric: She has a normal mood and affect. Her speech is normal.    ED Course  Procedures (including critical care time)  Labs Reviewed  CBC - Abnormal; Notable for the following:    RDW 18.3 (*)    Platelets 138 (*)    All other components within normal limits  COMPREHENSIVE METABOLIC PANEL - Abnormal; Notable for the following:    Potassium 3.3 (*)    Glucose, Bld 103 (*)    GFR calc non Af Amer 62 (*)    GFR calc Af Amer 71 (*)    All other components within normal limits  URINALYSIS, ROUTINE W REFLEX MICROSCOPIC - Abnormal; Notable for the following:    Hgb urine dipstick MODERATE (*)    Leukocytes, UA SMALL (*)    All other components within normal limits  DIFFERENTIAL  LIPASE, BLOOD  TROPONIN I  URINE MICROSCOPIC-ADD ON   Dg Abd Acute W/chest  01/17/2012  *RADIOLOGY REPORT*  Clinical Data: Epigastric pain, and nausea.  ACUTE ABDOMEN SERIES (ABDOMEN 2 VIEW & CHEST 1 VIEW)  Comparison: Multiple prior chest radiographs, most recently 12/12/2011.  Findings: Lung volumes are normal.  No consolidative airspace disease. Mild diffuse interstitial prominence and thickening of the central airways is similar to prior studies, apparently chronic. No pleural effusions.  Heart size is mildly enlarged with prominence of the left ventricular contour, which could suggest left ventricular hypertrophy.  Mediastinal contours are unremarkable.  Atherosclerosis in the thoracic aorta.  Status post median sternotomy for CABG.  No pneumoperitoneum.  Supine and upright views of the abdomen  demonstrate gas and stool spread throughout the colon and distal rectum.  There are multiple nondilated loops of gas-filled small bowel projecting over the left upper quadrant of the abdomen, the largest measuring up to 2.7 cm in diameter.  No definite pathologic distension of small bowel is identified.  No air fluid levels.  Surgical clips projecting over right upper quadrant of the abdomen, likely from prior cholecystectomy.  Extensive splenic artery calcifications.  A large calcification projecting over the right ilium and the right mid abdomen that shifts position significantly, likely representing a soft tissue calcification or something within the fecal stream. Multiple pelvic phleboliths are noted.  IMPRESSION: 1.  Nonspecific, nonobstructive bowel gas pattern, as above. 2.  No pneumoperitoneum. 3.  No radiographic evidence of acute cardiopulmonary disease. 4.  Status post cholecystectomy. 5.  Mild cardiomegaly (unchanged), with prominent left ventricular contour suggesting left ventricular hypertrophy. 6.  Atherosclerosis. 7.  Status post CABG.  Original Report Authenticated By: Florencia Reasons, M.D.     1. Abdominal pain      Date: 01/17/2012  Rate: 79  Rhythm: normal sinus rhythm  QRS Axis: normal  Intervals: normal  ST/T Wave abnormalities: nonspecific ST/T changes  Conduction Disutrbances:left bundle branch block  Narrative Interpretation:   Old EKG Reviewed: changes noted    MDM  Epigastric abdominal pain. Some tenderness. Lab work is overall reassuring. X-ray shows non-specific bowel pattern. EKG is reassuring. Enzymes are negative. Doubt this is a cardiac cause of the pain, she had a CABG in September. She'll followup as needed        Juliet Rude. Rubin Payor, MD 01/17/12 1214 patient is now worried that she's had some increased pain after eating. She does state this is similar to pain she had before her bypass. I will consult cardiology.  Juliet Rude. Rubin Payor, MD 01/17/12  1218

## 2012-01-17 NOTE — ED Notes (Signed)
Delay in EKG due to triage RN stating she did not need one in triage.

## 2012-01-17 NOTE — Discharge Instructions (Signed)

## 2012-01-17 NOTE — ED Notes (Signed)
D/c I/v 

## 2012-01-17 NOTE — ED Notes (Signed)
Patient present to ED with c/o abdominal pain.  Patient states that her abdominal pain is located around her navel area which started a day ago and then started again this am.  Intermittent pain that feels "sore."  C/o hard stools but denies dark stools. Patient denies N/V but has slight SOB.

## 2012-01-17 NOTE — ED Notes (Signed)
After eating pasta and crackers states abdomen achy radiating to epigastric Airway intact bilateral equal chest rise and fall.  Family members at bedside . EDP notified.

## 2012-01-28 IMAGING — CR DG CHEST 2V
2 series · 2 of 2 positions shown · non-contrast
Comparison: 08/14/2011

CLINICAL DATA: Status post CABG.

CHEST - 2 VIEW

[w chest pa]
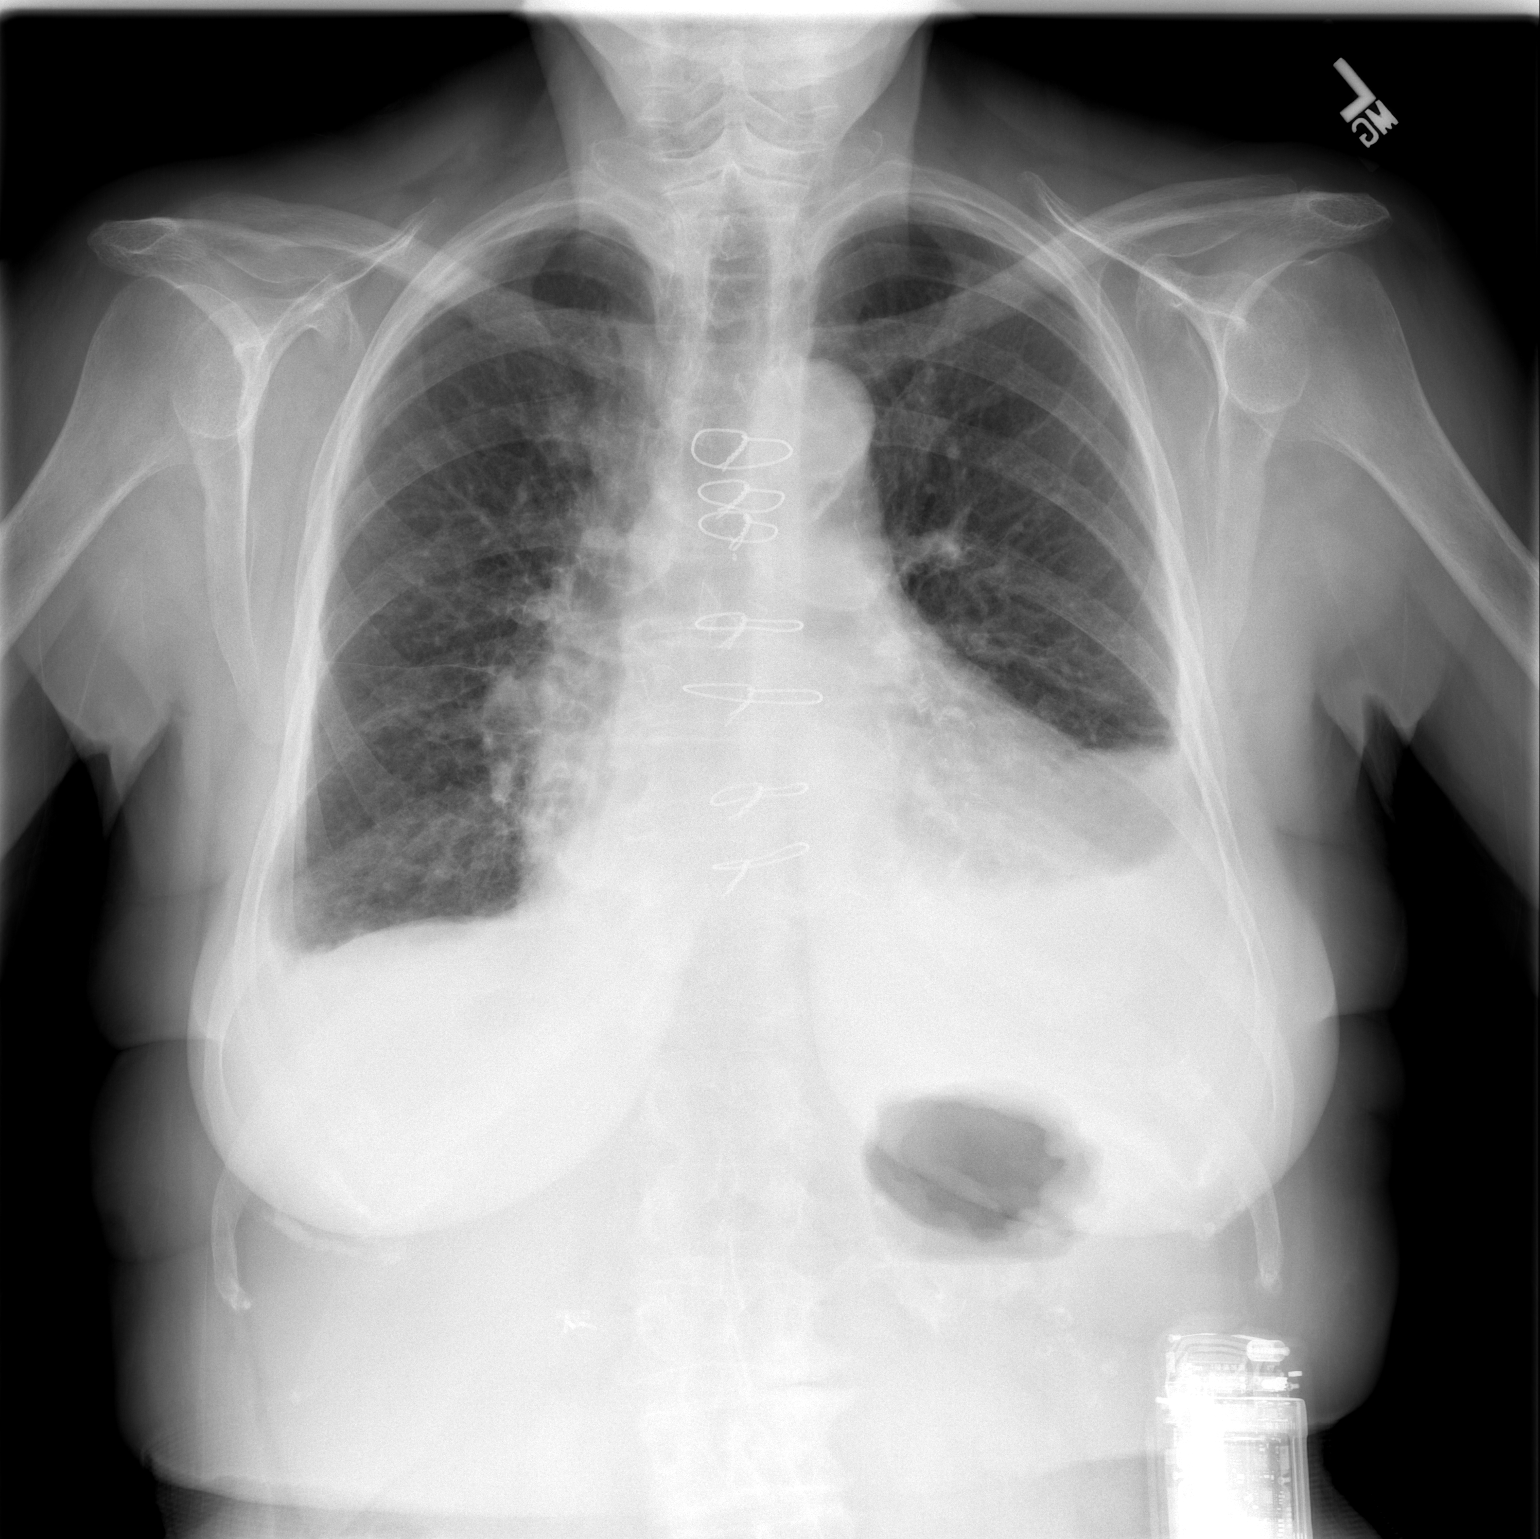

[w chest lat]
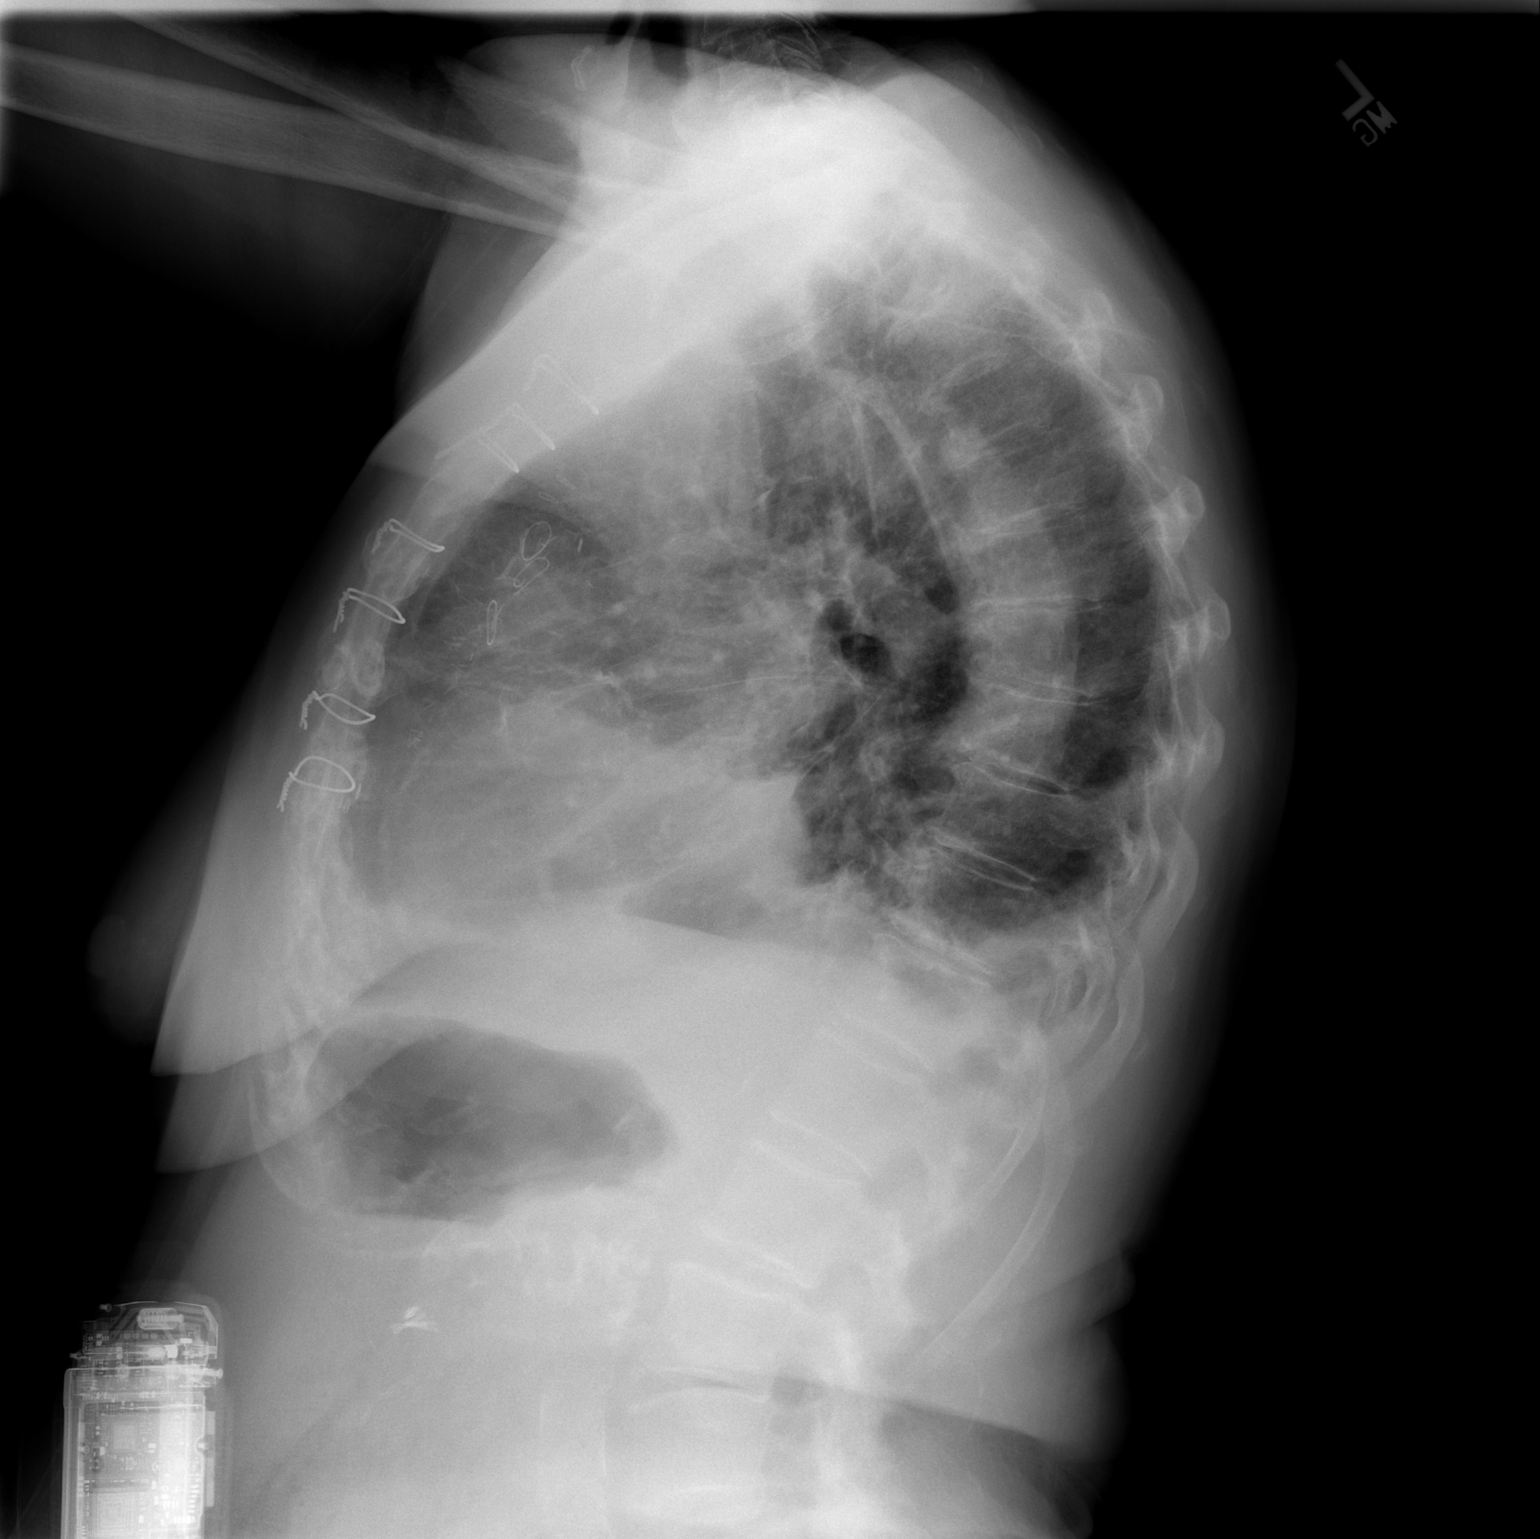

[2 of 2 positions shown; findings below may reference images not displayed]

FINDINGS: Removal of right IJ Cordis sheath. Prior median
sternotomy.  Mild cardiomegaly.  Small left greater than right
pleural effusions are similar, given differences in technique. No
pneumothorax.  Mild pulmonary venous congestion, superimposed upon
low lung volumes.  Improved bibasilar atelectasis, greater left
than right.
IMPRESSION: 1.  Improved bibasilar atelectasis.  Similar bilateral small
pleural effusions.
2.  Cardiomegaly and low lung volumes with mild pulmonary venous
congestion.

## 2012-07-22 ENCOUNTER — Encounter (HOSPITAL_COMMUNITY): Payer: Self-pay | Admitting: *Deleted

## 2012-07-22 ENCOUNTER — Emergency Department (HOSPITAL_COMMUNITY)
Admission: EM | Admit: 2012-07-22 | Discharge: 2012-07-22 | Disposition: A | Discharge status: Home / Self Care | Payer: Medicare Other | Attending: Emergency Medicine | Admitting: Emergency Medicine

## 2012-07-22 DIAGNOSIS — E1169 Type 2 diabetes mellitus with other specified complication: Secondary | ICD-10-CM | POA: Insufficient documentation

## 2012-07-22 DIAGNOSIS — E876 Hypokalemia: Secondary | ICD-10-CM

## 2012-07-22 DIAGNOSIS — R109 Unspecified abdominal pain: Secondary | ICD-10-CM

## 2012-07-22 DIAGNOSIS — I509 Heart failure, unspecified: Secondary | ICD-10-CM | POA: Insufficient documentation

## 2012-07-22 DIAGNOSIS — I10 Essential (primary) hypertension: Secondary | ICD-10-CM | POA: Insufficient documentation

## 2012-07-22 DIAGNOSIS — Z87891 Personal history of nicotine dependence: Secondary | ICD-10-CM | POA: Insufficient documentation

## 2012-07-22 DIAGNOSIS — R197 Diarrhea, unspecified: Secondary | ICD-10-CM

## 2012-07-22 DIAGNOSIS — E162 Hypoglycemia, unspecified: Secondary | ICD-10-CM

## 2012-07-22 DIAGNOSIS — K219 Gastro-esophageal reflux disease without esophagitis: Secondary | ICD-10-CM | POA: Insufficient documentation

## 2012-07-22 DIAGNOSIS — M810 Age-related osteoporosis without current pathological fracture: Secondary | ICD-10-CM | POA: Insufficient documentation

## 2012-07-22 DIAGNOSIS — Z79899 Other long term (current) drug therapy: Secondary | ICD-10-CM | POA: Insufficient documentation

## 2012-07-22 DIAGNOSIS — E785 Hyperlipidemia, unspecified: Secondary | ICD-10-CM | POA: Insufficient documentation

## 2012-07-22 LAB — BASIC METABOLIC PANEL
BUN: 26 mg/dL — ABNORMAL HIGH (ref 6–23)
CO2: 32 mEq/L (ref 19–32)
Chloride: 98 mEq/L (ref 96–112)
Creatinine, Ser: 1.1 mg/dL (ref 0.50–1.10)
GFR calc Af Amer: 52 mL/min — ABNORMAL LOW (ref >90)
Glucose, Bld: 118 mg/dL — ABNORMAL HIGH (ref 70–99)

## 2012-07-22 LAB — GLUCOSE, CAPILLARY: Glucose-Capillary: 52 mg/dL — ABNORMAL LOW (ref 70–99)

## 2012-07-22 LAB — URINE MICROSCOPIC-ADD ON

## 2012-07-22 LAB — URINALYSIS, ROUTINE W REFLEX MICROSCOPIC
Bilirubin Urine: NEGATIVE
Nitrite: NEGATIVE
Specific Gravity, Urine: 1.019 (ref 1.005–1.030)
Urobilinogen, UA: 1 mg/dL (ref 0.0–1.0)

## 2012-07-22 MED ORDER — POTASSIUM CHLORIDE CRYS ER 20 MEQ PO TBCR
20.0000 meq | EXTENDED_RELEASE_TABLET | Freq: Once | ORAL | Status: AC
  Administered 2012-07-22: 20 meq via ORAL
  Filled 2012-07-22: qty 1

## 2012-07-22 MED ORDER — SODIUM CHLORIDE 0.9 % IV BOLUS (SEPSIS)
500.0000 mL | Freq: Once | INTRAVENOUS | Status: AC
  Administered 2012-07-22: 500 mL via INTRAVENOUS

## 2012-07-22 MED ORDER — ONDANSETRON 4 MG PO TBDP: 4.0000 mg | ORAL_TABLET | Freq: Three times a day (TID) | ORAL | Status: AC | PRN

## 2012-07-22 NOTE — ED Provider Notes (Signed)
History     CSN: [id number]  Arrival date & time 07/22/12  4098   First MD Initiated Contact with Patient 07/22/12 0404      Chief Complaint  Patient presents with  . Abdominal Pain  . Dizziness    (Consider location/radiation/quality/duration/timing/severity/associated sxs/prior treatment) HPI Comments: 78 year old female with a history of diabetes, hypertension, congestive heart failure, coronary bypass grafting approximately one year ago. She presents with a complaint of diarrhea associated with lower abdominal cramping. She states that this happened this evening, awoke her from sleep, she had associated dizziness which is poorly described as the patient does have dementia and cannot remember the events. The family denies that the patient has had any fevers, chills, nausea, vomiting, blood in stools, swelling, cough. She has not recently been on antibiotics.  Patient is a 78 y.o. female presenting with abdominal pain. The history is provided by the patient and a relative. History Limited By: Dementia.  Abdominal Pain The primary symptoms of the illness include abdominal pain.    Past Medical History  Diagnosis Date  . Diabetes mellitus     TYPE 2  . Hypertension   . Hyperlipidemia   . Osteoporosis   . GERD (gastroesophageal reflux disease)   . CHF (congestive heart failure)   . BBB (bundle branch block)     CHRONIC  LEFT BBB    Past Surgical History  Procedure Date  . Cholecystectomy   . Abdominal hysterectomy   . Colonoscopy w/ polypectomy 7/2OO8       05/2010    NEOPLASTIC  POLYPS  . Cabg x 5 08/10/2011    DR BARTLE  . Coronary artery bypass graft 2012    Family History  Problem Relation Age of Onset  . Hypertension Mother   . Heart disease Father     History  Substance Use Topics  . Smoking status: Former Games developer  . Smokeless tobacco: Former Neurosurgeon    Quit date: 11/27/1980  . Alcohol Use: No    OB History    Grav Para Term Preterm Abortions TAB SAB Ect  Mult Living                  Review of Systems  Unable to perform ROS: Dementia  Gastrointestinal: Positive for abdominal pain.    Allergies  Review of patient's allergies indicates no known allergies.  Home Medications   Current Outpatient Rx  Name Route Sig Dispense Refill  . ASPIRIN 81 MG PO TABS Oral Take 81 mg by mouth daily.    Marland Kitchen CALCIUM CARBONATE 600 MG PO TABS Oral Take 600 mg by mouth daily.      . FUROSEMIDE 40 MG PO TABS Oral Take 40 mg by mouth daily.    Marland Kitchen GLIMEPIRIDE 4 MG PO TABS Oral Take 4 mg by mouth daily before breakfast.    . LISINOPRIL-HYDROCHLOROTHIAZIDE 20-25 MG PO TABS Oral Take 1 tablet by mouth daily.    Marland Kitchen METFORMIN HCL 500 MG PO TABS Oral Take 500 mg by mouth 2 (two) times daily with a meal.      . METOPROLOL TARTRATE 25 MG PO TABS Oral Take 25 mg by mouth 2 (two) times daily.     Marland Kitchen POLYETHYLENE GLYCOL 3350 PO PACK Oral Take 17 g by mouth daily.    Marland Kitchen ROSUVASTATIN CALCIUM 20 MG PO TABS Oral Take 20 mg by mouth daily.      Marland Kitchen VITAMIN B-12 250 MCG PO TABS Oral Take 250 mcg by mouth daily.      Marland Kitchen  ONDANSETRON 4 MG PO TBDP Oral Take 1 tablet (4 mg total) by mouth every 8 (eight) hours as needed for nausea. 10 tablet 0    BP 124/72  Pulse 64  Temp 97.7 F (36.5 C) (Oral)  Resp 16  SpO2 100%  Physical Exam  Nursing note and vitals reviewed. Constitutional: She appears well-developed and well-nourished. No distress.  HENT:  Head: Normocephalic and atraumatic.  Mouth/Throat: Oropharynx is clear and moist. No oropharyngeal exudate.  Eyes: Conjunctivae and EOM are normal. Pupils are equal, round, and reactive to light. Right eye exhibits no discharge. Left eye exhibits no discharge. No scleral icterus.  Neck: Normal range of motion. Neck supple. No JVD present. No thyromegaly present.  Cardiovascular: Normal rate, regular rhythm, normal heart sounds and intact distal pulses.  Exam reveals no gallop and no friction rub.   No murmur heard. Pulmonary/Chest:  Effort normal and breath sounds normal. No respiratory distress. She has no wheezes. She has no rales.  Abdominal: Soft. She exhibits no distension and no mass. There is tenderness ( Mild tenderness to palpation in the suprapubic area, no guarding, no pain at McBurney's point or in the right lower or right upper quadrant, non-peritoneal).        Increased bowel sounds  Musculoskeletal: Normal range of motion. She exhibits no edema and no tenderness.  Lymphadenopathy:    She has no cervical adenopathy.  Neurological: She is alert. Coordination normal.  Skin: Skin is warm and dry. No rash noted. No erythema.  Psychiatric: She has a normal mood and affect. Her behavior is normal.    ED Course  Procedures (including critical care time)  Labs Reviewed  GLUCOSE, CAPILLARY - Abnormal; Notable for the following:    Glucose-Capillary 52 (*)     All other components within normal limits  URINALYSIS, ROUTINE W REFLEX MICROSCOPIC - Abnormal; Notable for the following:    Leukocytes, UA MODERATE (*)     All other components within normal limits  BASIC METABOLIC PANEL - Abnormal; Notable for the following:    Potassium 3.3 (*)     Glucose, Bld 118 (*)     BUN 26 (*)     GFR calc non Af Amer 45 (*)     GFR calc Af Amer 52 (*)     All other components within normal limits  GLUCOSE, CAPILLARY  URINE MICROSCOPIC-ADD ON   No results found.   1. Diarrhea   2. Abdominal cramping   3. Hypoglycemia   4. Hypokalemia       MDM  Overall the patient's vital signs are normal, she is in a normal rhythm clinically, has a soft abdomen which is minimally tender in the suprapubic region. Check urinalysis, electrolytes, family describes the stools this evening as approximately 4 episodes of diarrhea which has been watery brown and green in appearance. She has no risk factors for Clostridium difficile.  She has had one BM since arrival which was runny, no abd pain on reexam and normal VS.  Her labs are  minimally abnormal with K of 3.3, UA which is normal and CBG which was initially < 60 but over 110 on labs.  She feels good and is amenable to d/c.    Discharge Prescriptions include:  zofran        Vida Roller, MD 07/22/12 [phone]

## 2012-07-22 NOTE — ED Notes (Signed)
PT c/o dizziness upon rising this morning and periumbilical and RLQ abdominal pain w/ nausea. Pt denies abdominal pain, no c/o constipation, denies dysuria, and vomiting.

## 2015-05-11 ENCOUNTER — Encounter: Payer: Self-pay | Admitting: Family Medicine

## 2015-05-11 ENCOUNTER — Ambulatory Visit (INDEPENDENT_AMBULATORY_CARE_PROVIDER_SITE_OTHER): Payer: Medicare Other | Admitting: Family Medicine

## 2015-05-11 VITALS — BP 122/64 | HR 83 | Temp 98.0°F | Resp 15 | Ht 65.0 in | Wt 157.0 lb

## 2015-05-11 DIAGNOSIS — I5022 Chronic systolic (congestive) heart failure: Secondary | ICD-10-CM | POA: Diagnosis not present

## 2015-05-11 DIAGNOSIS — I10 Essential (primary) hypertension: Secondary | ICD-10-CM

## 2015-05-11 DIAGNOSIS — E119 Type 2 diabetes mellitus without complications: Secondary | ICD-10-CM | POA: Diagnosis not present

## 2015-05-11 DIAGNOSIS — I5032 Chronic diastolic (congestive) heart failure: Secondary | ICD-10-CM | POA: Insufficient documentation

## 2015-05-11 DIAGNOSIS — E538 Deficiency of other specified B group vitamins: Secondary | ICD-10-CM | POA: Diagnosis not present

## 2015-05-11 MED ORDER — LISINOPRIL 20 MG PO TABS: 20.0000 mg | ORAL_TABLET | Freq: Every day | ORAL | Status: DC

## 2015-05-11 NOTE — Progress Notes (Signed)
Subjective:    Patient ID: [name], female    DOB: October 31, 1926, 81 y.o.   MRN: [id number]  HPI: [name] is a 81 y.o. female presenting on 05/11/2015 for Hypertension and Congestive Heart Failure   Hypertension This is a chronic problem. The current episode started more than 1 year ago. The problem has been gradually improving since onset. The problem is controlled. Pertinent negatives include no blurred vision, chest pain, headaches, palpitations, peripheral edema or shortness of breath. Past treatments include ACE inhibitors and diuretics. The current treatment provides moderate improvement. Hypertensive end-organ damage includes heart failure.  Congestive Heart Failure Presents for follow-up visit. Pertinent negatives include no abdominal pain, chest pain, chest pressure, edema, palpitations or shortness of breath. Past treatments include ACE inhibitors. Compliance with prior treatments has been good. Her past medical history is significant for CAD and DM.  Pt has h/o CHF and Afib. No current Cardiologist per old records. Last ECHO was 40% in 2012. Would like to refer her to cardiology today to ensure she is on proper medications. Per old providers notes she does not tolerate beta blockers.   B12 Deficiency: Pt takes oral supplement. Check labs to ensure WNL.   DM: Diet controlled. BG WNL per [facility] records. Will be due for HgA1c in July 2016.   Past Medical History  Diagnosis Date  . Diabetes mellitus     TYPE 2  . Hypertension   . Hyperlipidemia   . Osteoporosis   . GERD (gastroesophageal reflux disease)   . CHF (congestive heart failure)   . BBB (bundle branch block)     CHRONIC  LEFT BBB  . CHF (congestive heart failure)     Current Outpatient Prescriptions on File Prior to Visit  Medication Sig  . calcium carbonate (OS-CAL) 600 MG TABS Take 600 mg by mouth daily.    . furosemide (LASIX) 40 MG tablet Take 40 mg by mouth daily.  . polyethylene glycol  (MIRALAX / GLYCOLAX) packet Take 17 g by mouth daily.  . rosuvastatin (CRESTOR) 20 MG tablet Take 20 mg by mouth daily.    . vitamin B-12 (CYANOCOBALAMIN) 250 MCG tablet Take 250 mcg by mouth daily.    Marland Kitchen aspirin 81 MG tablet Take 81 mg by mouth daily.  . metoprolol tartrate (LOPRESSOR) 25 MG tablet Take 25 mg by mouth 2 (two) times daily.    No current facility-administered medications on file prior to visit.    Review of Systems  Constitutional: Negative for fever and chills.  HENT: Negative.   Eyes: Negative for blurred vision.  Respiratory: Negative for shortness of breath.   Cardiovascular: Negative for chest pain, palpitations and leg swelling.  Gastrointestinal: Negative for abdominal pain and abdominal distention.  Endocrine: Negative for cold intolerance, heat intolerance, polydipsia, polyphagia and polyuria.  Genitourinary: Negative.   Musculoskeletal: Negative.   Neurological: Negative for dizziness, numbness and headaches.  Psychiatric/Behavioral: Negative.    Per HPI unless specifically indicated above     Objective:    BP 122/64 mmHg  Pulse 83  Temp(Src) 98 F (36.7 C) (Oral)  Resp 15  Ht  (1.651 m)  Wt 157 lb (71.215 kg)  BMI 26.13 kg/m2  Wt Readings from Last 3 Encounters:  05/11/15 157 lb (71.215 kg)  12/12/11 157 lb (71.215 kg)  09/11/11 164 lb (74.39 kg)    Physical Exam  Constitutional: She is oriented to person, place, and time. She appears well-developed and well-nourished. No distress.  Neck:  Normal range of motion. Neck supple. No thyromegaly present.  Cardiovascular: Normal rate, regular rhythm and normal pulses.  Exam reveals no gallop and no friction rub.   Murmur heard.  Systolic murmur is present with a grade of 2/6  2/6 murmur heard best at LSB.   Pulmonary/Chest: Effort normal and breath sounds normal.  Abdominal: Soft. Bowel sounds are normal. There is no tenderness. There is no rebound.  Musculoskeletal: Normal range of motion. She  exhibits no edema or tenderness.  Lymphadenopathy:    She has no cervical adenopathy.  Neurological: She is alert and oriented to person, place, and time.  Skin: Skin is warm and dry. She is not diaphoretic.   Results for orders placed or performed during the hospital encounter of 07/22/12  Glucose, capillary  Result Value Ref Range   Glucose-Capillary 52 (L) 70 - 99 mg/dL  Urinalysis, Routine w reflex microscopic  Result Value Ref Range   Color, Urine YELLOW YELLOW   APPearance CLEAR CLEAR   Specific Gravity, Urine 1.019 1.005 - 1.030   pH 6.5 5.0 - 8.0   Glucose, UA NEGATIVE NEGATIVE mg/dL   Hgb urine dipstick NEGATIVE NEGATIVE   Bilirubin Urine NEGATIVE NEGATIVE   Ketones, ur NEGATIVE NEGATIVE mg/dL   Protein, ur NEGATIVE NEGATIVE mg/dL   Urobilinogen, UA 1.0 0.0 - 1.0 mg/dL   Nitrite NEGATIVE NEGATIVE   Leukocytes, UA MODERATE (A) NEGATIVE  Glucose, capillary  Result Value Ref Range   Glucose-Capillary 91 70 - 99 mg/dL   Comment 1 Documented in Chart    Comment 2 Notify RN   Basic metabolic panel  Result Value Ref Range   Sodium 139 135 - 145 mEq/L   Potassium 3.3 (L) 3.5 - 5.1 mEq/L   Chloride 98 96 - 112 mEq/L   CO2 32 19 - 32 mEq/L   Glucose, Bld 118 (H) 70 - 99 mg/dL   BUN 26 (H) 6 - 23 mg/dL   Creatinine, Ser 0.73 0.50 - 1.10 mg/dL   Calcium 9.7 8.4 - 71.0 mg/dL   GFR calc non Af Amer 45 (L) >90 mL/min   GFR calc Af Amer 52 (L) >90 mL/min  Urine microscopic-add on  Result Value Ref Range   Squamous Epithelial / LPF RARE RARE   WBC, UA 3-6 <3 WBC/hpf   RBC / HPF 0-2 <3 RBC/hpf   Bacteria, UA RARE RARE      Assessment & Plan:   Problem List Items Addressed This Visit      Cardiovascular and Mediastinum   Hypertension - Primary    Controlled. Continue Lisinopril.       Relevant Medications   lisinopril (PRINIVIL,ZESTRIL) 20 MG tablet   Other Relevant Orders   Comprehensive Metabolic Panel (CMET)   CHF (congestive heart failure)    Refer to  cardiology for evaluation.       Relevant Medications   lisinopril (PRINIVIL,ZESTRIL) 20 MG tablet   Other Relevant Orders   Lipid Profile   Ambulatory referral to Cardiology     Digestive   Vitamin B 12 deficiency   Relevant Orders   B12     Endocrine   Type 2 diabetes mellitus without complication    Diet controlled. A1c is due in July 2016.       Relevant Medications   lisinopril (PRINIVIL,ZESTRIL) 20 MG tablet      Meds ordered this encounter  Medications  . acetaminophen (TYLENOL) 325 MG tablet    Sig: Take 650 mg by mouth  every 6 (six) hours as needed.  . clopidogrel (PLAVIX) 75 MG tablet    Sig: Take 75 mg by mouth daily.  Marland Kitchen lisinopril (PRINIVIL,ZESTRIL) 20 MG tablet    Sig: Take 20 mg by mouth daily.      Follow up plan: Return in about 6 weeks (around 06/22/2015), or if symptoms worsen or fail to improve, for Diabetes.Marland Kitchen

## 2015-05-11 NOTE — Assessment & Plan Note (Signed)
Controlled  Continue Lisinopril

## 2015-05-11 NOTE — Patient Instructions (Signed)
Your goal blood pressure is 150/90. Work on low salt/sodium diet - goal <1.5gm (1,500mg ) per day. Eat a diet high in fruits/vegetables and whole grains.  Look into mediterranean and DASH diet. Goal activity is 178min/wk of moderate intensity exercise.  This can be split into 30 minute chunks.  If you are not at this level, you can start with smaller 10-15 min increments and slowly build up activity. Look at www.heart.org for more resources  We will schedule a cardiology appt for you.   You will need a diabetes follow-up after June 22, 2015 so we can check your A1c.

## 2015-05-11 NOTE — Assessment & Plan Note (Signed)
Diet controlled. A1c is due in July 2016.

## 2015-05-11 NOTE — Assessment & Plan Note (Signed)
Refer to cardiology for evaluation

## 2015-05-12 ENCOUNTER — Telehealth: Payer: Self-pay | Admitting: Family Medicine

## 2015-05-12 DIAGNOSIS — I5022 Chronic systolic (congestive) heart failure: Secondary | ICD-10-CM

## 2015-05-12 LAB — COMPREHENSIVE METABOLIC PANEL
ALBUMIN: 3.9 g/dL (ref 3.5–4.7)
ALT: 12 IU/L (ref 0–32)
AST: 18 IU/L (ref 0–40)
Albumin/Globulin Ratio: 1.5 (ref 1.1–2.5)
Alkaline Phosphatase: 66 IU/L (ref 39–117)
BUN/Creatinine Ratio: 12 (ref 11–26)
BUN: 15 mg/dL (ref 8–27)
Bilirubin Total: 0.4 mg/dL (ref 0.0–1.2)
CALCIUM: 9.7 mg/dL (ref 8.7–10.3)
CHLORIDE: 100 mmol/L (ref 97–108)
CO2: 28 mmol/L (ref 18–29)
Creatinine, Ser: 1.24 mg/dL — ABNORMAL HIGH (ref 0.57–1.00)
GFR calc Af Amer: 45 mL/min/{1.73_m2} — ABNORMAL LOW (ref >59)
GFR calc non Af Amer: 39 mL/min/{1.73_m2} — ABNORMAL LOW (ref >59)
GLOBULIN, TOTAL: 2.6 g/dL (ref 1.5–4.5)
Glucose: 91 mg/dL (ref 65–99)
Potassium: 4.4 mmol/L (ref 3.5–5.2)
Sodium: 142 mmol/L (ref 134–144)
Total Protein: 6.5 g/dL (ref 6.0–8.5)

## 2015-05-12 LAB — LIPID PANEL
CHOLESTEROL TOTAL: 149 mg/dL (ref 100–199)
Chol/HDL Ratio: 3.3 ratio units (ref 0.0–4.4)
HDL: 45 mg/dL (ref >39)
LDL CALC: 70 mg/dL (ref 0–99)
TRIGLYCERIDES: 170 mg/dL — AB (ref 0–149)
VLDL CHOLESTEROL CAL: 34 mg/dL (ref 5–40)

## 2015-05-12 LAB — VITAMIN B12: VITAMIN B 12: 1159 pg/mL — AB (ref 211–946)

## 2015-05-12 MED ORDER — FUROSEMIDE 20 MG PO TABS: 20.0000 mg | ORAL_TABLET | Freq: Every day | ORAL | Status: DC

## 2015-05-12 NOTE — Telephone Encounter (Signed)
Called labs to [facility] front office. We need to discontinue her Vitamin B12- it's very high.  CMP: Kidney function has dropped- GFR is 39.  Avoid any NSAIDs.  Drink plenty of fluids. We will continue to monitor. Her Cr is mildly elevated but this is improved from her last labs on 4/26 with previous provider. Cut her lasix to 20mg   Lipids: Doing well. Continue crestor. (Estimated CrCl is 36.85mL/min- safe to continue current dose)  Pt will need follow-up appt at the end of July for DM and to recheck B12.  Orders to DC B12 were sent to [facility].

## 2015-05-18 ENCOUNTER — Telehealth: Payer: Self-pay | Admitting: Family Medicine

## 2015-05-18 NOTE — Telephone Encounter (Signed)
Called pt about 06/17/15 8:45am appointment at [facility] but number listed in files is disconnected.

## 2015-05-19 NOTE — Telephone Encounter (Signed)
[facility] notified

## 2015-05-28 ENCOUNTER — Encounter: Payer: Self-pay | Admitting: Family Medicine

## 2015-05-28 ENCOUNTER — Ambulatory Visit (INDEPENDENT_AMBULATORY_CARE_PROVIDER_SITE_OTHER): Payer: Medicare Other | Admitting: Family Medicine

## 2015-05-28 VITALS — BP 139/75 | HR 71 | Temp 97.6°F | Resp 16 | Ht 64.0 in | Wt 157.6 lb

## 2015-05-28 DIAGNOSIS — S01302A Unspecified open wound of left ear, initial encounter: Secondary | ICD-10-CM | POA: Diagnosis not present

## 2015-05-28 MED ORDER — BACITRACIN-NEOMYCIN-POLYMYXIN 400-5-5000 EX OINT: 1.0000 "application " | TOPICAL_OINTMENT | Freq: Every day | CUTANEOUS | Status: DC

## 2015-05-28 NOTE — Progress Notes (Signed)
Subjective:    Patient ID: [name], female    DOB: [birth year], 81 y.o.   MRN: [id number]  HPI: [name] I [name] is a 81 y.o. female presenting on 05/28/2015 for Recurrent Skin Infections   HPI   Pt presents for for a lesion on her L ear. She reported lesion to assisted living on Monday 6/27.  Lesion was irritated and yesterday 6/30 it began to ooze. No fevers or chills. Pt describes lesion as "irritating" but not painful. Pt reports no pus from wound, just clear fluid. No redness in the ear. Pt has never had a lesion like this before. Pt had a haircut  Months in May and they nicked her ear but it was healing.   Past Medical History  Diagnosis Date  . Diabetes mellitus     TYPE 2  . Hypertension   . Hyperlipidemia   . Osteoporosis   . GERD (gastroesophageal reflux disease)   . CHF (congestive heart failure)   . BBB (bundle branch block)     CHRONIC  LEFT BBB  . CHF (congestive heart failure)     Current Outpatient Prescriptions on File Prior to Visit  Medication Sig  . acetaminophen (TYLENOL) 325 MG tablet Take 650 mg by mouth every 6 (six) hours as needed.  Marland Kitchen. aspirin 81 MG tablet Take 81 mg by mouth daily.  . calcium carbonate (OS-CAL) 600 MG TABS Take 600 mg by mouth daily.    . clopidogrel (PLAVIX) 75 MG tablet Take 75 mg by mouth daily.  . furosemide (LASIX) 20 MG tablet Take 1 tablet (20 mg total) by mouth daily.  Marland Kitchen. lisinopril (PRINIVIL,ZESTRIL) 20 MG tablet Take 1 tablet (20 mg total) by mouth daily.  . metoprolol tartrate (LOPRESSOR) 25 MG tablet Take 25 mg by mouth 2 (two) times daily.   . polyethylene glycol (MIRALAX / GLYCOLAX) packet Take 17 g by mouth daily.  . rosuvastatin (CRESTOR) 20 MG tablet Take 20 mg by mouth daily.     No current facility-administered medications on file prior to visit.    Review of Systems  Constitutional: Negative for fever and chills.  HENT: Negative.   Eyes: Negative.   Respiratory: Negative for chest tightness, shortness of  breath and wheezing.   Cardiovascular: Negative for chest pain and palpitations.  Skin: Positive for wound.  Neurological: Negative for dizziness, facial asymmetry and numbness.   Per HPI unless specifically indicated above     Objective:    BP 139/75 mmHg  Pulse 71  Temp(Src) 97.6 F (36.4 C) (Oral)  Resp 16  Ht 5\' 4"  (1.626 m)  Wt 157 lb 9.6 oz (71.487 kg)  BMI 27.04 kg/m2  Wt Readings from Last 3 Encounters:  05/28/15 157 lb 9.6 oz (71.487 kg)  05/11/15 157 lb (71.215 kg)  12/12/11 157 lb (71.215 kg)    Physical Exam  Constitutional: She appears well-developed and well-nourished. No distress.  HENT:  Head: Normocephalic and atraumatic.  Skin: Skin is warm. Abrasion noted. No pallor.  L ear wound, ulcerated approx 0.5cm x 0.5cm. Sanguinous drainage only.  Non-tender to palpation. Edges approximated.  Surrounding skin clean, dry, intact.    Results for orders placed or performed in visit on 05/11/15  Comprehensive Metabolic Panel (CMET)  Result Value Ref Range   Glucose 91 65 - 99 mg/dL   BUN 15 8 - 27 mg/dL   Creatinine, Ser 1.301.24 (H) 0.57 - 1.00 mg/dL   GFR calc non Af Amer 39 (L) >59  mL/min/1.73   GFR calc Af Amer 45 (L) >59 mL/min/1.73   BUN/Creatinine Ratio 12 11 - 26   Sodium 142 134 - 144 mmol/L   Potassium 4.4 3.5 - 5.2 mmol/L   Chloride 100 97 - 108 mmol/L   CO2 28 18 - 29 mmol/L   Calcium 9.7 8.7 - 10.3 mg/dL   Total Protein 6.5 6.0 - 8.5 g/dL   Albumin 3.9 3.5 - 4.7 g/dL   Globulin, Total 2.6 1.5 - 4.5 g/dL   Albumin/Globulin Ratio 1.5 1.1 - 2.5   Bilirubin Total 0.4 0.0 - 1.2 mg/dL   Alkaline Phosphatase 66 39 - 117 IU/L   AST 18 0 - 40 IU/L   ALT 12 0 - 32 IU/L  Lipid Profile  Result Value Ref Range   Cholesterol, Total 149 100 - 199 mg/dL   Triglycerides 161 (H) 0 - 149 mg/dL   HDL 45 >09 mg/dL   VLDL Cholesterol Cal 34 5 - 40 mg/dL   LDL Calculated 70 0 - 99 mg/dL   Chol/HDL Ratio 3.3 0.0 - 4.4 ratio units  B12  Result Value Ref Range    Vitamin B-12 1159 (H) 211 - 946 pg/mL      Assessment & Plan:   Problem List Items Addressed This Visit    None    Visit Diagnoses    Ear wound, left, initial encounter    -  Primary    L ear wound- cleaned with saline, applied triple ointment today. Wound care orders x2 weeks given to facility. RTC for spreading redness, drainage, fever.    Relevant Medications    neomycin-bacitracin-polymyxin (NEOSPORIN) ointment       Meds ordered this encounter  Medications  . neomycin-bacitracin-polymyxin (NEOSPORIN) ointment    Sig: Apply 1 application topically daily. Apply daily to wound on L ear.    Dispense:  15 g    Refill:  0    Order Specific Question:  Supervising Provider    Answer:  Janeann Forehand [[id number]]      Follow up plan: Return in about 2 weeks (around 06/11/2015), or if symptoms worsen or fail to improve.

## 2015-05-28 NOTE — Patient Instructions (Signed)
Keep ear wound clean and dry. Apply triple ointment once daily when you change the bandage. Keep an eye out for spreading redness, pus draining, throbbing of the ear or other concerning symptoms.  If the wound is not healing in 2 weeks, please call the office and make an appt.

## 2015-06-14 ENCOUNTER — Telehealth: Payer: Self-pay | Admitting: Family Medicine

## 2015-06-14 NOTE — Telephone Encounter (Signed)
[name] from [facility] called to check the status of referral for eye exam.  Someone from DSS was checking.  If there is nothing scheduled by the end of the week, they need something in writing to say that referral is in process.  Please call [phone]

## 2015-06-14 NOTE — Telephone Encounter (Signed)
Pt has appointment on 07/16/2015 at 1:40 pm and also confirmation letter faxed to [facility].

## 2015-06-17 ENCOUNTER — Ambulatory Visit (INDEPENDENT_AMBULATORY_CARE_PROVIDER_SITE_OTHER): Payer: Medicare Other | Admitting: Cardiovascular Disease

## 2015-06-17 ENCOUNTER — Encounter: Payer: Self-pay | Admitting: Cardiovascular Disease

## 2015-06-17 VITALS — BP 130/64 | HR 74 | Ht 64.0 in | Wt 161.5 lb

## 2015-06-17 DIAGNOSIS — I5022 Chronic systolic (congestive) heart failure: Secondary | ICD-10-CM | POA: Diagnosis not present

## 2015-06-17 DIAGNOSIS — I10 Essential (primary) hypertension: Secondary | ICD-10-CM | POA: Diagnosis not present

## 2015-06-17 DIAGNOSIS — I454 Nonspecific intraventricular block: Secondary | ICD-10-CM | POA: Diagnosis not present

## 2015-06-17 NOTE — Patient Instructions (Signed)
Medication Instructions:  Your physician has recommended you make the following change in your medication:  STOP taking Plavix  Labwork: none  Testing/Procedures: none  Follow-Up: Your physician recommends that you schedule a follow-up appointment in: one year with Dr. Elease Hashimoto.    Any Other Special Instructions Will Be Listed Below (If Applicable).

## 2015-06-17 NOTE — Progress Notes (Signed)
Cardiology Office Note   Date:  06/17/2015   ID:  [name], DOB Sep 24, 1926, MRN [id number]  PCP:  Filbert Berthold, NP  Cardiologist:   Verdis Prime, MD   Chief Complaint  Patient presents with  . other    NP hx CABG no complaints. Meds reviewed verbally with pt.     Problem list: 1. Coronary artery disease, status post coronary artery bypass grafting in 2012 2. Atrial fibrillation, postoperatively 3. Left bundle branch block 4. Essential hypertension 5. Hyperlipidemia 6. Diabetes mellitus   History of Present Illness: [name] is a 81 y.o. female who presents for follow up of her CAD Was seen with [facility] )  She has significant memory problems. Denies any CP or dyspnea. Able to do all of her normal activities without any problems.      Past Medical History  Diagnosis Date  . Diabetes mellitus     TYPE 2  . Hypertension   . Hyperlipidemia   . Osteoporosis   . GERD (gastroesophageal reflux disease)   . CHF (congestive heart failure)   . BBB (bundle branch block)     CHRONIC  LEFT BBB  . CHF (congestive heart failure)     Past Surgical History  Procedure Laterality Date  . Cholecystectomy    . Abdominal hysterectomy    . Colonoscopy w/ polypectomy  7/2OO8       05/2010    NEOPLASTIC  POLYPS  . Cabg x 5  08/10/2011    DR BARTLE  . Coronary artery bypass graft  2012     Current Outpatient Prescriptions  Medication Sig Dispense Refill  . acetaminophen (TYLENOL) 325 MG tablet Take 650 mg by mouth every 6 (six) hours as needed.    Marland Kitchen aspirin 81 MG tablet Take 81 mg by mouth daily.    . calcium carbonate (OS-CAL) 600 MG TABS Take 600 mg by mouth daily.      . clopidogrel (PLAVIX) 75 MG tablet Take 75 mg by mouth daily.    . furosemide (LASIX) 20 MG tablet Take 1 tablet (20 mg total) by mouth daily. 30 tablet 5  . lisinopril (PRINIVIL,ZESTRIL) 20 MG tablet Take 1 tablet (20 mg total) by mouth daily. 30 tablet 11  .  neomycin-bacitracin-polymyxin (NEOSPORIN) ointment Apply 1 application topically daily. Apply daily to wound on L ear. 15 g 0  . polyethylene glycol (MIRALAX / GLYCOLAX) packet Take 17 g by mouth daily.    . rosuvastatin (CRESTOR) 40 MG tablet Take 40 mg by mouth daily.     No current facility-administered medications for this visit.    Allergies:   Actonel    Social History:  The patient  reports that she has quit smoking. She quit smokeless tobacco use about 34 years ago. She reports that she does not drink alcohol or use illicit drugs.   Family History:  The patient's family history includes Heart disease in her father; Hypertension in her mother.    ROS:  Please see the history of present illness.    Review of Systems: Constitutional:  denies fever, chills, diaphoresis, appetite change and fatigue.  HEENT: denies photophobia, eye pain, redness, hearing loss, ear pain, congestion, sore throat, rhinorrhea, sneezing, neck pain, neck stiffness and tinnitus.  Respiratory: denies SOB, DOE, cough, chest tightness, and wheezing.  Cardiovascular: denies chest pain, palpitations and leg swelling.  Gastrointestinal: denies nausea, vomiting, abdominal pain, diarrhea, constipation, blood in stool.  Genitourinary: denies  dysuria, urgency, frequency, hematuria, flank pain and difficulty urinating.  Musculoskeletal: denies  myalgias, back pain, joint swelling, arthralgias and gait problem.   Skin: denies pallor, rash and wound.  Neurological: denies dizziness, seizures, syncope, weakness, light-headedness, numbness and headaches.   Hematological: denies adenopathy, easy bruising, personal or family bleeding history.  Psychiatric/ Behavioral: denies suicidal ideation, mood changes, confusion, nervousness, sleep disturbance and agitation.       All other systems are reviewed and negative.    PHYSICAL EXAM: VS:  BP 130/64 mmHg  Pulse 74  Ht 5\' 4"  (1.626 m)  Wt 73.256 kg (161 lb 8 oz)  BMI  27.71 kg/m2 , BMI Body mass index is 27.71 kg/(m^2). GEN: Well nourished, well developed, in no acute distress HEENT: normal Neck: no JVD, carotid bruits, or masses Cardiac: RRR; no murmurs, rubs, or gallops,no edema  Respiratory:  clear to auscultation bilaterally, normal work of breathing GI: soft, nontender, nondistended, + BS MS: no deformity or atrophy Skin: warm and dry, no rash Neuro:  Strength and sensation are intact Psych: normal   EKG:  EKG is ordered today. The ekg ordered today demonstrates  NSR at 74.   LBBB   Recent Labs: 05/11/2015: ALT 12; BUN 15; Creatinine, Ser 1.24*; Potassium 4.4; Sodium 142    Lipid Panel    Component Value Date/Time   CHOL 149 05/11/2015 1504   CHOL 132 07/31/2011 0550   TRIG 170* 05/11/2015 1504   HDL 45 05/11/2015 1504   HDL 47 07/31/2011 0550   CHOLHDL 3.3 05/11/2015 1504   CHOLHDL 2.8 07/31/2011 0550   VLDL 46* 07/31/2011 0550   LDLCALC 70 05/11/2015 1504   LDLCALC 39 07/31/2011 0550      Wt Readings from Last 3 Encounters:  06/17/15 73.256 kg (161 lb 8 oz)  05/28/15 71.487 kg (157 lb 9.6 oz)  05/11/15 71.215 kg (157 lb)      Other studies Reviewed: Additional studies/ records that were reviewed today include: . Review of the above records demonstrates:    ASSESSMENT AND PLAN:  1. Coronary artery disease, status post coronary artery bypass grafting in 2012- she has been very stable. It appears that Plavix was added to her medical regimen in June of this year. I cannot tell from the notes while was added. At this point I think we should discontinue it she's not had any recent stenting and has not had a stroke from my review of the records. She'll continue aspirin.  2. Atrial fibrillation, postoperatively - she's currently in normal sinus rhythm. At this point I do not think that starting anticoagulation would be of benefit. She has maintained sinus rhythm. She's 88 and has significant cognitive impairment. At this point I  think the risk of starting anticoagulation exceeds any potential benefit.  3. Left bundle branch block  4. Essential hypertension - blood pressure is well-controlled.  5. Hyperlipidemia  6. Diabetes mellitus    Current medicines are reviewed at length with the patient today.  The patient does not have concerns regarding medicines.  The following changes have been made:  no change  Labs/ tests ordered today include:  Orders Placed This Encounter  Procedures  . EKG 12-Lead     Disposition:   FU with me in 1 year.       Mohd. Derflinger, Deloris Ping, MD  06/17/2015 9:30 AM    [facility] [address] [place], [place], [place]  [address] Phone: [phone]; Fax: [phone]   [facility]  Office  [address] [place], [place]  [address] [phone]   Fax [phone]

## 2015-08-06 ENCOUNTER — Encounter (INDEPENDENT_AMBULATORY_CARE_PROVIDER_SITE_OTHER): Payer: Self-pay

## 2015-08-06 ENCOUNTER — Ambulatory Visit (INDEPENDENT_AMBULATORY_CARE_PROVIDER_SITE_OTHER): Payer: Medicare Other | Admitting: Family Medicine

## 2015-08-06 ENCOUNTER — Encounter: Payer: Self-pay | Admitting: Family Medicine

## 2015-08-06 VITALS — BP 146/74 | HR 81 | Temp 97.8°F | Resp 16 | Ht 64.0 in | Wt 163.0 lb

## 2015-08-06 DIAGNOSIS — L989 Disorder of the skin and subcutaneous tissue, unspecified: Secondary | ICD-10-CM | POA: Insufficient documentation

## 2015-08-06 DIAGNOSIS — I10 Essential (primary) hypertension: Secondary | ICD-10-CM | POA: Diagnosis not present

## 2015-08-06 DIAGNOSIS — Z23 Encounter for immunization: Secondary | ICD-10-CM

## 2015-08-06 NOTE — Patient Instructions (Signed)
Continue her current meds/ 

## 2015-08-06 NOTE — Progress Notes (Signed)
Name: [name]   MRN: [id number]    DOB: [birth year]   Date:08/06/2015       Progress Note  Subjective  Chief Complaint  Chief Complaint  Patient presents with  . Ear Injury    Left ear follow up 2 month    HPI  Here for f/u of HBP.  Taking meds.  Feeling OK.    Has L. Ear (pinna) skin lesion.  Had an ulcer there in past.  Now with hard, raised lesion there. No problem-specific assessment & plan notes found for this encounter.   Past Medical History  Diagnosis Date  . Diabetes mellitus     TYPE 2  . Hypertension   . Hyperlipidemia   . Osteoporosis   . GERD (gastroesophageal reflux disease)   . CHF (congestive heart failure)   . BBB (bundle branch block)     CHRONIC  LEFT BBB  . CHF (congestive heart failure)     Social History  Substance Use Topics  . Smoking status: Former Games developer  . Smokeless tobacco: Former Neurosurgeon    Quit date: 11/27/1980  . Alcohol Use: No     Current outpatient prescriptions:  .  acetaminophen (TYLENOL) 325 MG tablet, Take 650 mg by mouth every 6 (six) hours as needed., Disp: , Rfl:  .  aspirin 81 MG tablet, Take 81 mg by mouth daily., Disp: , Rfl:  .  calcium carbonate (OS-CAL) 600 MG TABS, Take 600 mg by mouth daily.  , Disp: , Rfl:  .  furosemide (LASIX) 20 MG tablet, Take 1 tablet (20 mg total) by mouth daily., Disp: 30 tablet, Rfl: 5 .  lisinopril (PRINIVIL,ZESTRIL) 20 MG tablet, Take 1 tablet (20 mg total) by mouth daily., Disp: 30 tablet, Rfl: 11 .  neomycin-bacitracin-polymyxin (NEOSPORIN) ointment, Apply 1 application topically daily. Apply daily to wound on L ear., Disp: 15 g, Rfl: 0 .  polyethylene glycol (MIRALAX / GLYCOLAX) packet, Take 17 g by mouth daily., Disp: , Rfl:  .  rosuvastatin (CRESTOR) 40 MG tablet, Take 40 mg by mouth daily., Disp: , Rfl:   Allergies  Allergen Reactions  . Actonel [Risedronate Sodium]     Review of Systems  Constitutional: Negative for fever, chills and weight loss.  HENT: Negative for  hearing loss.   Eyes: Negative for blurred vision and double vision.  Respiratory: Negative for cough, sputum production, shortness of breath and wheezing.   Cardiovascular: Negative for chest pain, palpitations, orthopnea and leg swelling.  Gastrointestinal: Negative for heartburn, vomiting, abdominal pain, diarrhea and blood in stool.  Genitourinary: Negative for dysuria, urgency and frequency.  Neurological: Negative for headaches.      Objective  Filed Vitals:   08/06/15 0912  BP: 146/74  Pulse: 81  Temp: 97.8 F (36.6 C)  TempSrc: Oral  Resp: 16  Height: 5\' 4"  (1.626 m)  Weight: 163 lb (73.936 kg)     Physical Exam  Constitutional: She is oriented to person, place, and time and well-developed, well-nourished, and in no distress. No distress.  HENT:  Head: Normocephalic and atraumatic.  Eyes: Conjunctivae and EOM are normal. Pupils are equal, round, and reactive to light. No scleral icterus.  Neck: Normal range of motion. Neck supple. Carotid bruit is present. No thyromegaly present.  Cardiovascular: Normal rate and regular rhythm.  Exam reveals no gallop and no friction rub.   Murmur heard.  Systolic murmur is present with a grade of 3/6  throughout  Pulmonary/Chest: Effort normal and breath sounds  normal. No respiratory distress. She has no wheezes. She has no rales.  Musculoskeletal: She exhibits edema.  Lymphadenopathy:    She has no cervical adenopathy.  Neurological: She is alert and oriented to person, place, and time.  Skin: Lesion (cutaneous horn of L. pinna.) noted.  Vitals reviewed.     Recent Results (from the past 2160 hour(s))  Comprehensive Metabolic Panel (CMET)     Status: Abnormal   Collection Time: 05/11/15  3:04 PM  Result Value Ref Range   Glucose 91 65 - 99 mg/dL   BUN 15 8 - 27 mg/dL   Creatinine, Ser 8.11 (H) 0.57 - 1.00 mg/dL   GFR calc non Af Amer 39 (L) >59 mL/min/1.73   GFR calc Af Amer 45 (L) >59 mL/min/1.73   BUN/Creatinine  Ratio 12 11 - 26   Sodium 142 134 - 144 mmol/L   Potassium 4.4 3.5 - 5.2 mmol/L   Chloride 100 97 - 108 mmol/L   CO2 28 18 - 29 mmol/L   Calcium 9.7 8.7 - 10.3 mg/dL   Total Protein 6.5 6.0 - 8.5 g/dL   Albumin 3.9 3.5 - 4.7 g/dL   Globulin, Total 2.6 1.5 - 4.5 g/dL   Albumin/Globulin Ratio 1.5 1.1 - 2.5   Bilirubin Total 0.4 0.0 - 1.2 mg/dL   Alkaline Phosphatase 66 39 - 117 IU/L   AST 18 0 - 40 IU/L   ALT 12 0 - 32 IU/L  Lipid Profile     Status: Abnormal   Collection Time: 05/11/15  3:04 PM  Result Value Ref Range   Cholesterol, Total 149 100 - 199 mg/dL   Triglycerides 914 (H) 0 - 149 mg/dL   HDL 45 >78 mg/dL    Comment: According to ATP-III Guidelines, HDL-C >59 mg/dL is considered a negative risk factor for CHD.    VLDL Cholesterol Cal 34 5 - 40 mg/dL   LDL Calculated 70 0 - 99 mg/dL   Chol/HDL Ratio 3.3 0.0 - 4.4 ratio units    Comment:                                   T. Chol/HDL Ratio                                             Men  Women                               1/2 Avg.Risk  3.4    3.3                                   Avg.Risk  5.0    4.4                                2X Avg.Risk  9.6    7.1                                3X Avg.Risk 23.4   11.0   B12     Status: Abnormal   Collection  Time: 05/11/15  3:04 PM  Result Value Ref Range   Vitamin B-12 1159 (H) 211 - 946 pg/mL     Assessment & Plan  1. Essential hypertension   2. Skin lesion on examination  - Ambulatory referral to Dermatology  3. Need for influenza vaccination  - Flu vaccine HIGH DOSE PF (Fluzone High dose)

## 2015-08-10 ENCOUNTER — Telehealth: Payer: Self-pay | Admitting: *Deleted

## 2015-08-10 NOTE — Telephone Encounter (Signed)
[facility] appt 08/11/15 @ 11:30. Called assisting living facility with appt info and phone #s. Called Crystal at cell # and was disconnected. Called back several times and left message.

## 2015-08-18 ENCOUNTER — Telehealth: Payer: Self-pay

## 2015-08-18 NOTE — Telephone Encounter (Signed)
Go ahead and give verbal order for B12 to be discontinued or remove from chart.-jh

## 2015-08-18 NOTE — Telephone Encounter (Signed)
[name] from [facility] called reporting that between June to September that [name]'s vit B12 was d/c.  She is reporting that she has no d/c order for that.  She is requesting for a d/c order to be faxed to [facility] at [phone].  You may also call her back at [phone].

## 2015-08-19 NOTE — Telephone Encounter (Signed)
Called [facility] and gave verbal per Dr. Juanetta Gosling to d/c Vitamin B-12. Left message for Morrie Sheldon at [facility].

## 2015-11-05 ENCOUNTER — Ambulatory Visit (INDEPENDENT_AMBULATORY_CARE_PROVIDER_SITE_OTHER): Payer: Medicare Other | Admitting: Family Medicine

## 2015-11-05 ENCOUNTER — Encounter: Payer: Self-pay | Admitting: Family Medicine

## 2015-11-05 VITALS — BP 140/70 | HR 85 | Temp 97.3°F | Resp 16 | Ht 64.0 in | Wt 165.8 lb

## 2015-11-05 DIAGNOSIS — I10 Essential (primary) hypertension: Secondary | ICD-10-CM

## 2015-11-05 DIAGNOSIS — E785 Hyperlipidemia, unspecified: Secondary | ICD-10-CM

## 2015-11-05 DIAGNOSIS — L989 Disorder of the skin and subcutaneous tissue, unspecified: Secondary | ICD-10-CM

## 2015-11-05 DIAGNOSIS — I5022 Chronic systolic (congestive) heart failure: Secondary | ICD-10-CM

## 2015-11-05 NOTE — Progress Notes (Signed)
Name: [name]   MRN: [id number]    DOB: 03/13/1926   Date:11/05/2015       Progress Note  Subjective  Chief Complaint  No chief complaint on file.   HPI  Here for f/u of skin leaion on L ear.  She has had bx done by Dr. Ebony Cargo and there are plans for further referral (? MOHS surgery). Also with HB P.  Talking  BP meds and chol meds No problem-specific assessment & plan notes found for this encounter.   Past Medical History  Diagnosis Date  . Diabetes mellitus     TYPE 2  . Hypertension   . Hyperlipidemia   . Osteoporosis   . GERD (gastroesophageal reflux disease)   . CHF (congestive heart failure) (HCC)   . BBB (bundle branch block)     CHRONIC  LEFT BBB  . CHF (congestive heart failure) [facility])     Past Surgical History  Procedure Laterality Date  . Cholecystectomy    . Abdominal hysterectomy    . Colonoscopy w/ polypectomy  7/2OO8       05/2010    NEOPLASTIC  POLYPS  . Cabg x 5  08/10/2011    DR BARTLE  . Coronary artery bypass graft  2012    Family History  Problem Relation Age of Onset  . Hypertension Mother   . Heart disease Father     Social History   Social History  . Marital Status: Widowed    Spouse Name: N/A  . Number of Children: N/A  . Years of Education: N/A   Occupational History  . Not on file.   Social History Main Topics  . Smoking status: Former Games developer  . Smokeless tobacco: Former Neurosurgeon    Quit date: 11/27/1980  . Alcohol Use: No  . Drug Use: No  . Sexual Activity: Not on file   Other Topics Concern  . Not on file   Social History Narrative     Current outpatient prescriptions:  .  calcium carbonate (OS-CAL) 600 MG TABS, Take 600 mg by mouth daily.  , Disp: , Rfl:  .  furosemide (LASIX) 20 MG tablet, Take 1 tablet (20 mg total) by mouth daily., Disp: 30 tablet, Rfl: 5 .  lisinopril (PRINIVIL,ZESTRIL) 20 MG tablet, Take 1 tablet (20 mg total) by mouth daily., Disp: 30 tablet, Rfl: 11 .   neomycin-bacitracin-polymyxin (NEOSPORIN) ointment, Apply 1 application topically daily. Apply daily to wound on L ear., Disp: 15 g, Rfl: 0 .  polyethylene glycol (MIRALAX / GLYCOLAX) packet, Take 17 g by mouth daily., Disp: , Rfl:  .  rosuvastatin (CRESTOR) 40 MG tablet, Take 40 mg by mouth daily., Disp: , Rfl:   Allergies  Allergen Reactions  . Actonel [Risedronate Sodium]   . Risedronate Rash     Review of Systems  Constitutional: Negative for fever, chills, weight loss and malaise/fatigue.  HENT: Negative for hearing loss.   Eyes: Negative for blurred vision and double vision.  Respiratory: Negative for cough, shortness of breath and wheezing.   Cardiovascular: Negative for chest pain, palpitations and leg swelling.  Gastrointestinal: Negative for heartburn, abdominal pain and blood in stool.  Genitourinary: Negative for dysuria, urgency and frequency.  Skin: Negative for rash.  Neurological: Negative for dizziness, tremors, weakness and headaches.      Objective  Filed Vitals:   11/05/15 1054 11/05/15 1118  BP: 148/82 140/70  Pulse: 85   Temp: 97.3 F (36.3 C)   Resp: 16  Height: 5\' 4"  (1.626 m)   Weight: 165 lb 12.8 oz (75.206 kg)     Physical Exam  Constitutional: She is oriented to person, place, and time and well-developed, well-nourished, and in no distress. No distress.  HENT:  Head: Normocephalic and atraumatic.  Eyes: Conjunctivae are normal. Pupils are equal, round, and reactive to light. No scleral icterus.  Neck: Normal range of motion. Neck supple. Carotid bruit is not present. No thyromegaly present.  Cardiovascular: Normal rate and regular rhythm.  Exam reveals no gallop and no friction rub.   Murmur heard.  Systolic murmur is present with a grade of 3/6  URSB  Pulmonary/Chest: Effort normal and breath sounds normal. No respiratory distress. She has no wheezes. She has no rales.  Musculoskeletal: She exhibits no edema.  Lymphadenopathy:    She  has no cervical adenopathy.  Neurological: She is alert and oriented to person, place, and time.  Skin:  Bx. Taken from L upper ear and bandaid in place covering lesion.       No results found for this or any previous visit (from the past 2160 hour(s)).   Assessment & Plan  Problem List Items Addressed This Visit      Cardiovascular and Mediastinum   Hypertension   CHF (congestive heart failure) (HCC)     Musculoskeletal and Integument   Skin lesion on examination - Primary     Other   Hyperlipidemia      No orders of the defined types were placed in this encounter.   1. Skin lesion on examination  -cont. To f/u with Dr. Dwana CurdGraham's office 2. Essential hypertension -cont. meds  3. Chronic systolic congestive heart failure (HCC)  -cont. meds 4. Hyperlipidemia -cont. meds

## 2015-11-11 ENCOUNTER — Other Ambulatory Visit: Payer: Self-pay | Admitting: Family Medicine

## 2015-11-19 ENCOUNTER — Other Ambulatory Visit: Payer: Self-pay | Admitting: Family Medicine

## 2015-12-16 LAB — HM DIABETES EYE EXAM

## 2015-12-28 ENCOUNTER — Encounter: Payer: Self-pay | Admitting: Family Medicine

## 2016-01-12 ENCOUNTER — Encounter: Payer: Self-pay | Admitting: Family Medicine

## 2016-01-12 ENCOUNTER — Other Ambulatory Visit: Payer: Self-pay | Admitting: Family Medicine

## 2016-01-19 ENCOUNTER — Other Ambulatory Visit: Payer: Self-pay | Admitting: Family Medicine

## 2016-03-03 ENCOUNTER — Telehealth: Payer: Self-pay | Admitting: Family Medicine

## 2016-03-03 NOTE — Telephone Encounter (Signed)
Vernona RiegerLaura from [facility] called to cancel pt's appt on 4/11.  She will be seeing the on site doctors now.

## 2016-03-07 ENCOUNTER — Ambulatory Visit: Payer: Medicare Other | Admitting: Family Medicine

## 2016-03-22 ENCOUNTER — Encounter: Payer: Self-pay | Admitting: *Deleted

## 2016-03-22 ENCOUNTER — Emergency Department
Admission: EM | Admit: 2016-03-22 | Discharge: 2016-03-22 | Disposition: A | Discharge status: Home / Self Care | Payer: Medicare Other | Attending: Emergency Medicine | Admitting: Emergency Medicine

## 2016-03-22 ENCOUNTER — Emergency Department: Payer: Medicare Other

## 2016-03-22 DIAGNOSIS — I447 Left bundle-branch block, unspecified: Secondary | ICD-10-CM | POA: Diagnosis not present

## 2016-03-22 DIAGNOSIS — Z87891 Personal history of nicotine dependence: Secondary | ICD-10-CM | POA: Diagnosis not present

## 2016-03-22 DIAGNOSIS — Z951 Presence of aortocoronary bypass graft: Secondary | ICD-10-CM | POA: Diagnosis not present

## 2016-03-22 DIAGNOSIS — E785 Hyperlipidemia, unspecified: Secondary | ICD-10-CM | POA: Insufficient documentation

## 2016-03-22 DIAGNOSIS — M810 Age-related osteoporosis without current pathological fracture: Secondary | ICD-10-CM | POA: Insufficient documentation

## 2016-03-22 DIAGNOSIS — R0602 Shortness of breath: Secondary | ICD-10-CM | POA: Diagnosis present

## 2016-03-22 DIAGNOSIS — I110 Hypertensive heart disease with heart failure: Secondary | ICD-10-CM | POA: Insufficient documentation

## 2016-03-22 DIAGNOSIS — E119 Type 2 diabetes mellitus without complications: Secondary | ICD-10-CM | POA: Insufficient documentation

## 2016-03-22 DIAGNOSIS — Z79899 Other long term (current) drug therapy: Secondary | ICD-10-CM | POA: Insufficient documentation

## 2016-03-22 DIAGNOSIS — I5032 Chronic diastolic (congestive) heart failure: Secondary | ICD-10-CM | POA: Diagnosis not present

## 2016-03-22 LAB — BASIC METABOLIC PANEL
Anion gap: 11 (ref 5–15)
BUN: 17 mg/dL (ref 6–20)
CHLORIDE: 106 mmol/L (ref 101–111)
CO2: 20 mmol/L — AB (ref 22–32)
CREATININE: 1.06 mg/dL — AB (ref 0.44–1.00)
Calcium: 9.5 mg/dL (ref 8.9–10.3)
GFR calc non Af Amer: 46 mL/min — ABNORMAL LOW (ref >60)
GFR, EST AFRICAN AMERICAN: 53 mL/min — AB (ref >60)
Glucose, Bld: 94 mg/dL (ref 65–99)
POTASSIUM: 4.3 mmol/L (ref 3.5–5.1)
SODIUM: 137 mmol/L (ref 135–145)

## 2016-03-22 LAB — CBC WITH DIFFERENTIAL/PLATELET
Basophils Absolute: 0 10*3/uL (ref 0–0.1)
Basophils Relative: 1 %
EOS ABS: 0.1 10*3/uL (ref 0–0.7)
Eosinophils Relative: 2 %
HEMATOCRIT: 38.4 % (ref 35.0–47.0)
HEMOGLOBIN: 12.8 g/dL (ref 12.0–16.0)
LYMPHS ABS: 1 10*3/uL (ref 1.0–3.6)
LYMPHS PCT: 15 %
MCH: 27.4 pg (ref 26.0–34.0)
MCHC: 33.4 g/dL (ref 32.0–36.0)
MCV: 82 fL (ref 80.0–100.0)
MONOS PCT: 12 %
Monocytes Absolute: 0.8 10*3/uL (ref 0.2–0.9)
NEUTROS ABS: 4.6 10*3/uL (ref 1.4–6.5)
NEUTROS PCT: 70 %
Platelets: 147 10*3/uL — ABNORMAL LOW (ref 150–440)
RBC: 4.69 MIL/uL (ref 3.80–5.20)
RDW: 16.6 % — ABNORMAL HIGH (ref 11.5–14.5)
WBC: 6.5 10*3/uL (ref 3.6–11.0)

## 2016-03-22 MED ORDER — FUROSEMIDE 40 MG PO TABS
ORAL_TABLET | ORAL | Status: AC
  Administered 2016-03-22: 20 mg via ORAL
  Filled 2016-03-22: qty 1

## 2016-03-22 MED ORDER — FUROSEMIDE 20 MG PO TABS: 20.0000 mg | ORAL_TABLET | Freq: Every day | ORAL | Status: DC

## 2016-03-22 MED ORDER — FUROSEMIDE 40 MG PO TABS
20.0000 mg | ORAL_TABLET | Freq: Once | ORAL | Status: AC
  Administered 2016-03-22: 20 mg via ORAL

## 2016-03-22 NOTE — ED Notes (Signed)
[facility] after family requested pt be evaluated for a non productive cough that pt reports began yesterday. EMS reports pts family had reported pt became SOB this afternoon. Pt denies pain or SOB at this time but is coughing every time pt attempts to speak. Pt denies chest pain or congestion. Pt states "I got this cough when I stopped taking my medication" medication is unknown at this time.

## 2016-03-22 NOTE — Discharge Instructions (Signed)

## 2016-03-22 NOTE — ED Provider Notes (Signed)
[facility] Emergency Department Provider Note  ____________________________________________  Time seen: 11:05 AM  I have reviewed the triage vital signs and the nursing notes.   HISTORY  Chief Complaint Cough    HPI [name] is a 82 y.o. female brought to the ED for gradual onset of shortness of breath worse with exertion associated with a nonproductive cough. The cough is been going on for about 24 hours. No chest pain fevers or chills. No abdominal pain nausea vomiting diarrhea sore throat dizziness or syncope. Family report that the patient was taken off of blood pressure medications and Lasix about 3 weeks ago because it was making her potassium low. She has chronic diastolic heart failure. Since then she's had very gradual onset of worsening shortness of breath.     Past Medical History  Diagnosis Date  . Diabetes mellitus     TYPE 2  . Hypertension   . Hyperlipidemia   . Osteoporosis   . GERD (gastroesophageal reflux disease)   . CHF (congestive heart failure) (HCC)   . BBB (bundle branch block)     CHRONIC  LEFT BBB  . CHF (congestive heart failure) [facility])      Patient Active Problem List   Diagnosis Date Noted  . Skin lesion on examination 08/06/2015  . CHF (congestive heart failure) (HCC) 05/11/2015  . Vitamin B 12 deficiency 05/11/2015  . Type 2 diabetes mellitus without complication (HCC)   . Hypertension   . Hyperlipidemia   . Osteoporosis   . GERD (gastroesophageal reflux disease)   . BBB (bundle branch block)      Past Surgical History  Procedure Laterality Date  . Cholecystectomy    . Abdominal hysterectomy    . Colonoscopy w/ polypectomy  7/2OO8       05/2010    NEOPLASTIC  POLYPS  . Cabg x 5  08/10/2011    DR BARTLE  . Coronary artery bypass graft  2012     Current Outpatient Rx  Name  Route  Sig  Dispense  Refill  . acetaminophen (TYLENOL) 500 MG tablet   Oral   Take 500 mg by mouth 2 (two) times daily.          . Calcium Carb-Cholecalciferol (CALCIUM PLUS VITAMIN D3) 600-800 MG-UNIT TABS   Oral   Take 1 tablet by mouth 2 (two) times daily.         Marland Kitchen lisinopril (PRINIVIL,ZESTRIL) 10 MG tablet   Oral   Take 10 mg by mouth daily.         . polyethylene glycol (MIRALAX / GLYCOLAX) packet   Oral   Take 17 g by mouth daily as needed.          . potassium citrate (UROCIT-K) 10 MEQ (1080 MG) SR tablet   Oral   Take 10 mEq by mouth every other day.         . rosuvastatin (CRESTOR) 10 MG tablet   Oral   Take 10 mg by mouth daily.         . furosemide (LASIX) 20 MG tablet   Oral   Take 1 tablet (20 mg total) by mouth daily.   30 tablet   0      Allergies Actonel and Risedronate   Family History  Problem Relation Age of Onset  . Hypertension Mother   . Heart disease Father     Social History Social History  Substance Use Topics  . Smoking status: Former Games developer  .  Smokeless tobacco: Former Neurosurgeon    Quit date: 11/27/1980  . Alcohol Use: No    Review of Systems  Constitutional:   No fever or chills.  Eyes:   No vision changes.  ENT:   No sore throat. No rhinorrhea. Cardiovascular:   No chest pain. Respiratory:   Positive shortness of breath and nonproductive cough. Gastrointestinal:   Negative for abdominal pain, vomiting and diarrhea.  Genitourinary:   Negative for dysuria or difficulty urinating. Musculoskeletal:   Negative for focal pain or swelling Neurological:   Negative for headaches 10-point ROS otherwise negative.  ____________________________________________   PHYSICAL EXAM:  VITAL SIGNS: ED Triage Vitals  Enc Vitals Group     BP 03/22/16 1119 153/94 mmHg     Pulse Rate 03/22/16 1119 86     Resp 03/22/16 1119 27     Temp 03/22/16 1119 97.8 F (36.6 C)     Temp Source 03/22/16 1119 Oral     SpO2 03/22/16 1119 99 %     Weight 03/22/16 1119 169 lb (76.658 kg)     Height 03/22/16 1119  (1.651 m)     Head Cir --      Peak Flow --       Pain Score --      Pain Loc --      Pain Edu? --      Excl. in GC? --     Vital signs reviewed, nursing assessments reviewed.   Constitutional:   Alert and orientedTo person and place. Well appearing and in no distress. Eyes:   No scleral icterus. No conjunctival pallor. PERRL. EOMI.  No nystagmus. ENT   Head:   Normocephalic and atraumatic.   Nose:   No congestion/rhinnorhea. No septal hematoma   Mouth/Throat:   MMM, no pharyngeal erythema. No peritonsillar mass.    Neck:   No stridor. No SubQ emphysema. No meningismus. Elevated jugular venous pressure Hematological/Lymphatic/Immunilogical:   No cervical lymphadenopathy. Cardiovascular:   RRR. Symmetric bilateral radial and DP pulses.  No murmurs.  Respiratory:   Normal respiratory effort without tachypnea nor retractions. Bilateral basilar crackles  Gastrointestinal:   Soft and nontender. Non distended. There is no CVA tenderness.  No rebound, rigidity, or guarding. Genitourinary:   deferred Musculoskeletal:   Nontender with normal range of motion in all extremities. No joint effusions.  No lower extremity tenderness.  No edema. Neurologic:   Normal speech and language.  CN 2-10 normal. Motor grossly intact. No gross focal neurologic deficits are appreciated.  Skin:    Skin is warm, dry and intact. No rash noted.  No petechiae, purpura, or bullae.  ____________________________________________    LABS (pertinent positives/negatives) (all labs ordered are listed, but only abnormal results are displayed) Labs Reviewed  BASIC METABOLIC PANEL - Abnormal; Notable for the following:    CO2 20 (*)    Creatinine, Ser 1.06 (*)    GFR calc non Af Amer 46 (*)    GFR calc Af Amer 53 (*)    All other components within normal limits  CBC WITH DIFFERENTIAL/PLATELET - Abnormal; Notable for the following:    RDW 16.6 (*)    Platelets 147 (*)    All other components within normal limits    ____________________________________________   EKG  Interpreted by me Normal sinus rhythm rate of 87, normal axis and intervals. Left bundle branch block, left ventricular hypertrophy with repolarization abnormality in the lateral leads. No acute ischemic changes. Normal ST segments.  ____________________________________________  RADIOLOGY  Chest x-ray shows mild cardiomegaly and mild bilateral interstitial prominence pulmonary vascular congestion. No effusions, no edema.  ____________________________________________   PROCEDURES   ____________________________________________   INITIAL IMPRESSION / ASSESSMENT AND PLAN / ED COURSE  Pertinent labs & imaging results that were available during my care of the patient were reviewed by me and considered in my medical decision making (see chart for details).  Patient well appearing no acute distress. No hypoxia. History and examination consistent with mild congestive heart failure related to discontinuation of her diuretic medication. Labs are unremarkable, chest x-ray confirms this but does not reveal any severe findings. Potassium is normal, creatinine is improved from previous baseline. At this time it appears the patient needs the Lasix, so I'll restart her with a dose here and prescription. Follow up with primary care for further evaluation and monitoring of the lateralized. Plan discussed with the family and patient agree.     ____________________________________________   FINAL CLINICAL IMPRESSION(S) / ED DIAGNOSES  Final diagnoses:  Chronic diastolic congestive heart failure (HCC)       Portions of this note were generated with dragon dictation software. Dictation errors may occur despite best attempts at proofreading.   Sharman CheekPhillip Cathern Tahir, MD 03/22/16 1320

## 2016-04-13 ENCOUNTER — Emergency Department: Payer: Medicare Other

## 2016-04-13 ENCOUNTER — Inpatient Hospital Stay
Admission: EM | Admit: 2016-04-13 | Discharge: 2016-04-19 | DRG: 689 | Disposition: A | Discharge status: Home Health | Payer: Medicare Other | Attending: Specialist | Admitting: Specialist

## 2016-04-13 ENCOUNTER — Encounter: Payer: Self-pay | Admitting: Urgent Care

## 2016-04-13 DIAGNOSIS — J9801 Acute bronchospasm: Secondary | ICD-10-CM | POA: Diagnosis present

## 2016-04-13 DIAGNOSIS — R05 Cough: Secondary | ICD-10-CM

## 2016-04-13 DIAGNOSIS — E1122 Type 2 diabetes mellitus with diabetic chronic kidney disease: Secondary | ICD-10-CM | POA: Diagnosis present

## 2016-04-13 DIAGNOSIS — N170 Acute kidney failure with tubular necrosis: Secondary | ICD-10-CM | POA: Diagnosis present

## 2016-04-13 DIAGNOSIS — E1165 Type 2 diabetes mellitus with hyperglycemia: Secondary | ICD-10-CM | POA: Diagnosis present

## 2016-04-13 DIAGNOSIS — I10 Essential (primary) hypertension: Secondary | ICD-10-CM | POA: Diagnosis present

## 2016-04-13 DIAGNOSIS — I5032 Chronic diastolic (congestive) heart failure: Secondary | ICD-10-CM | POA: Diagnosis present

## 2016-04-13 DIAGNOSIS — E785 Hyperlipidemia, unspecified: Secondary | ICD-10-CM | POA: Diagnosis present

## 2016-04-13 DIAGNOSIS — Z87891 Personal history of nicotine dependence: Secondary | ICD-10-CM

## 2016-04-13 DIAGNOSIS — I130 Hypertensive heart and chronic kidney disease with heart failure and stage 1 through stage 4 chronic kidney disease, or unspecified chronic kidney disease: Secondary | ICD-10-CM | POA: Diagnosis present

## 2016-04-13 DIAGNOSIS — R059 Cough, unspecified: Secondary | ICD-10-CM

## 2016-04-13 DIAGNOSIS — I447 Left bundle-branch block, unspecified: Secondary | ICD-10-CM | POA: Diagnosis present

## 2016-04-13 DIAGNOSIS — I4891 Unspecified atrial fibrillation: Secondary | ICD-10-CM | POA: Diagnosis present

## 2016-04-13 DIAGNOSIS — Z951 Presence of aortocoronary bypass graft: Secondary | ICD-10-CM | POA: Diagnosis not present

## 2016-04-13 DIAGNOSIS — E860 Dehydration: Secondary | ICD-10-CM | POA: Diagnosis present

## 2016-04-13 DIAGNOSIS — F0280 Dementia in other diseases classified elsewhere without behavioral disturbance: Secondary | ICD-10-CM | POA: Diagnosis present

## 2016-04-13 DIAGNOSIS — K219 Gastro-esophageal reflux disease without esophagitis: Secondary | ICD-10-CM | POA: Diagnosis present

## 2016-04-13 DIAGNOSIS — N183 Chronic kidney disease, stage 3 (moderate): Secondary | ICD-10-CM | POA: Diagnosis present

## 2016-04-13 DIAGNOSIS — G309 Alzheimer's disease, unspecified: Secondary | ICD-10-CM | POA: Diagnosis present

## 2016-04-13 DIAGNOSIS — I2510 Atherosclerotic heart disease of native coronary artery without angina pectoris: Secondary | ICD-10-CM | POA: Diagnosis present

## 2016-04-13 DIAGNOSIS — Z8249 Family history of ischemic heart disease and other diseases of the circulatory system: Secondary | ICD-10-CM | POA: Diagnosis not present

## 2016-04-13 DIAGNOSIS — M810 Age-related osteoporosis without current pathological fracture: Secondary | ICD-10-CM | POA: Diagnosis present

## 2016-04-13 DIAGNOSIS — N390 Urinary tract infection, site not specified: Secondary | ICD-10-CM | POA: Diagnosis not present

## 2016-04-13 DIAGNOSIS — N179 Acute kidney failure, unspecified: Secondary | ICD-10-CM | POA: Diagnosis present

## 2016-04-13 DIAGNOSIS — J189 Pneumonia, unspecified organism: Secondary | ICD-10-CM | POA: Diagnosis not present

## 2016-04-13 DIAGNOSIS — T380X5A Adverse effect of glucocorticoids and synthetic analogues, initial encounter: Secondary | ICD-10-CM | POA: Diagnosis present

## 2016-04-13 DIAGNOSIS — Z888 Allergy status to other drugs, medicaments and biological substances status: Secondary | ICD-10-CM

## 2016-04-13 DIAGNOSIS — E119 Type 2 diabetes mellitus without complications: Secondary | ICD-10-CM

## 2016-04-13 HISTORY — DX: Chronic kidney disease, stage 3 unspecified: N18.30

## 2016-04-13 HISTORY — DX: Unspecified atrial fibrillation: I48.91

## 2016-04-13 HISTORY — DX: Chronic diastolic (congestive) heart failure: I50.32

## 2016-04-13 HISTORY — DX: Alzheimer's disease, unspecified: G30.9

## 2016-04-13 HISTORY — DX: Chronic kidney disease, stage 3 (moderate): N18.3

## 2016-04-13 HISTORY — DX: Dementia in other diseases classified elsewhere, unspecified severity, without behavioral disturbance, psychotic disturbance, mood disturbance, and anxiety: F02.80

## 2016-04-13 HISTORY — DX: Atherosclerotic heart disease of native coronary artery without angina pectoris: I25.10

## 2016-04-13 LAB — URINALYSIS COMPLETE WITH MICROSCOPIC (ARMC ONLY)
Bilirubin Urine: NEGATIVE
Glucose, UA: NEGATIVE mg/dL
Ketones, ur: NEGATIVE mg/dL
NITRITE: NEGATIVE
Protein, ur: 100 mg/dL — AB
Specific Gravity, Urine: 1.015 (ref 1.005–1.030)
pH: 5 (ref 5.0–8.0)

## 2016-04-13 LAB — BASIC METABOLIC PANEL
ANION GAP: 8 (ref 5–15)
BUN: 24 mg/dL — AB (ref 6–20)
CHLORIDE: 101 mmol/L (ref 101–111)
CO2: 26 mmol/L (ref 22–32)
Calcium: 9.9 mg/dL (ref 8.9–10.3)
Creatinine, Ser: 1.41 mg/dL — ABNORMAL HIGH (ref 0.44–1.00)
GFR calc Af Amer: 38 mL/min — ABNORMAL LOW (ref >60)
GFR calc non Af Amer: 32 mL/min — ABNORMAL LOW (ref >60)
Glucose, Bld: 133 mg/dL — ABNORMAL HIGH (ref 65–99)
POTASSIUM: 4.4 mmol/L (ref 3.5–5.1)
SODIUM: 135 mmol/L (ref 135–145)

## 2016-04-13 LAB — CBC
HCT: 41.3 % (ref 35.0–47.0)
HEMATOCRIT: 42.2 % (ref 35.0–47.0)
HEMOGLOBIN: 13.6 g/dL (ref 12.0–16.0)
Hemoglobin: 13.5 g/dL (ref 12.0–16.0)
MCH: 26.7 pg (ref 26.0–34.0)
MCH: 26.7 pg (ref 26.0–34.0)
MCHC: 32.3 g/dL (ref 32.0–36.0)
MCHC: 32.8 g/dL (ref 32.0–36.0)
MCV: 81.5 fL (ref 80.0–100.0)
MCV: 82.8 fL (ref 80.0–100.0)
PLATELETS: 148 10*3/uL — AB (ref 150–440)
Platelets: 159 10*3/uL (ref 150–440)
RBC: 5.07 MIL/uL (ref 3.80–5.20)
RBC: 5.1 MIL/uL (ref 3.80–5.20)
RDW: 16.2 % — AB (ref 11.5–14.5)
RDW: 16.5 % — AB (ref 11.5–14.5)
WBC: 7.2 10*3/uL (ref 3.6–11.0)
WBC: 8.2 10*3/uL (ref 3.6–11.0)

## 2016-04-13 LAB — GLUCOSE, CAPILLARY: Glucose-Capillary: 136 mg/dL — ABNORMAL HIGH (ref 65–99)

## 2016-04-13 LAB — HEPATIC FUNCTION PANEL
ALBUMIN: 4 g/dL (ref 3.5–5.0)
ALT: 15 U/L (ref 14–54)
AST: 24 U/L (ref 15–41)
Alkaline Phosphatase: 79 U/L (ref 38–126)
Bilirubin, Direct: 0.2 mg/dL (ref 0.1–0.5)
Indirect Bilirubin: 0.4 mg/dL (ref 0.3–0.9)
TOTAL PROTEIN: 7.4 g/dL (ref 6.5–8.1)
Total Bilirubin: 0.6 mg/dL (ref 0.3–1.2)

## 2016-04-13 LAB — TROPONIN I: Troponin I: 0.04 ng/mL — ABNORMAL HIGH (ref <0.031)

## 2016-04-13 LAB — LIPASE, BLOOD: Lipase: 23 U/L (ref 11–51)

## 2016-04-13 LAB — BRAIN NATRIURETIC PEPTIDE: B NATRIURETIC PEPTIDE 5: 539 pg/mL — AB (ref 0.0–100.0)

## 2016-04-13 MED ORDER — ACETAMINOPHEN 650 MG RE SUPP
650.0000 mg | Freq: Four times a day (QID) | RECTAL | Status: DC | PRN
Start: 2016-04-13 — End: 2016-04-19

## 2016-04-13 MED ORDER — ONDANSETRON HCL 4 MG/2ML IJ SOLN
4.0000 mg | Freq: Four times a day (QID) | INTRAMUSCULAR | Status: DC | PRN
Start: 2016-04-13 — End: 2016-04-19

## 2016-04-13 MED ORDER — SODIUM CHLORIDE 0.9 % IV BOLUS (SEPSIS)
250.0000 mL | Freq: Once | INTRAVENOUS | Status: AC
  Administered 2016-04-13: 250 mL via INTRAVENOUS

## 2016-04-13 MED ORDER — HEPARIN SODIUM (PORCINE) 5000 UNIT/ML IJ SOLN
5000.0000 [IU] | Freq: Three times a day (TID) | INTRAMUSCULAR | Status: DC
  Administered 2016-04-14: 5000 [IU] via SUBCUTANEOUS
  Filled 2016-04-13: qty 1

## 2016-04-13 MED ORDER — INSULIN ASPART 100 UNIT/ML ~~LOC~~ SOLN
0.0000 [IU] | Freq: Three times a day (TID) | SUBCUTANEOUS | Status: DC
  Administered 2016-04-14 (×2): 1 [IU] via SUBCUTANEOUS
  Administered 2016-04-14: 2 [IU] via SUBCUTANEOUS
  Administered 2016-04-15: 1 [IU] via SUBCUTANEOUS
  Administered 2016-04-15: 2 [IU] via SUBCUTANEOUS
  Administered 2016-04-16: 1 [IU] via SUBCUTANEOUS
  Administered 2016-04-16: 2 [IU] via SUBCUTANEOUS
  Administered 2016-04-16 – 2016-04-17 (×2): 1 [IU] via SUBCUTANEOUS
  Administered 2016-04-17: 3 [IU] via SUBCUTANEOUS
  Administered 2016-04-17: 1 [IU] via SUBCUTANEOUS
  Administered 2016-04-18 (×2): 2 [IU] via SUBCUTANEOUS
  Administered 2016-04-18: 5 [IU] via SUBCUTANEOUS
  Administered 2016-04-19: 2 [IU] via SUBCUTANEOUS
  Administered 2016-04-19: 3 [IU] via SUBCUTANEOUS
  Filled 2016-04-13 (×2): qty 2
  Filled 2016-04-13 (×2): qty 1
  Filled 2016-04-13 (×2): qty 2
  Filled 2016-04-13: qty 1
  Filled 2016-04-13: qty 2
  Filled 2016-04-13: qty 3
  Filled 2016-04-13: qty 1
  Filled 2016-04-13: qty 5
  Filled 2016-04-13 (×2): qty 1
  Filled 2016-04-13: qty 3
  Filled 2016-04-13: qty 1
  Filled 2016-04-13: qty 2

## 2016-04-13 MED ORDER — INSULIN ASPART 100 UNIT/ML ~~LOC~~ SOLN
0.0000 [IU] | Freq: Every day | SUBCUTANEOUS | Status: DC
  Administered 2016-04-17: 2 [IU] via SUBCUTANEOUS
  Filled 2016-04-13: qty 2

## 2016-04-13 MED ORDER — ACETAMINOPHEN 325 MG PO TABS
650.0000 mg | ORAL_TABLET | Freq: Four times a day (QID) | ORAL | Status: DC | PRN
Start: 2016-04-13 — End: 2016-04-19
  Administered 2016-04-14 – 2016-04-17 (×4): 650 mg via ORAL
  Filled 2016-04-13 (×4): qty 2

## 2016-04-13 MED ORDER — GUAIFENESIN-DM 100-10 MG/5ML PO SYRP
5.0000 mL | ORAL_SOLUTION | ORAL | Status: DC | PRN
  Administered 2016-04-14 – 2016-04-15 (×4): 5 mL via ORAL
  Filled 2016-04-13 (×4): qty 5

## 2016-04-13 MED ORDER — SODIUM CHLORIDE 0.9 % IV SOLN
INTRAVENOUS | Status: AC
  Administered 2016-04-13: via INTRAVENOUS

## 2016-04-13 MED ORDER — ONDANSETRON HCL 4 MG PO TABS
4.0000 mg | ORAL_TABLET | Freq: Four times a day (QID) | ORAL | Status: DC | PRN
  Administered 2016-04-14 – 2016-04-16 (×2): 4 mg via ORAL
  Filled 2016-04-13 (×2): qty 1

## 2016-04-13 MED ORDER — DEXTROSE 5 % IV SOLN
1.0000 g | Freq: Once | INTRAVENOUS | Status: AC
  Administered 2016-04-13: 1 g via INTRAVENOUS
  Filled 2016-04-13: qty 10

## 2016-04-13 MED ORDER — SODIUM CHLORIDE 0.9% FLUSH
3.0000 mL | Freq: Two times a day (BID) | INTRAVENOUS | Status: DC
  Administered 2016-04-13 – 2016-04-19 (×12): 3 mL via INTRAVENOUS

## 2016-04-13 NOTE — ED Provider Notes (Signed)
[facility] Emergency Department Provider Note  ____________________________________________   I have reviewed the triage vital signs and the nursing notes.   HISTORY  Chief Complaint Nausea; Emesis; and Bloated    HPI [name] is a 82 y.o. female with a history of atrial fibrillation left bundle branch block CHF, multiple other medical problems presents today with nausea, decreased appetite, feeling generally weak for the last few days. She denies any fever or chills. She does have mild occasional dysuria. She denies chest pain or shortness of breath. She denies headache or diarrhea last bowel movement was this morning and normal. She states her abdomen looks bloated to her but does not have any pain or tenderness. Emesis was nonbloody and nonbilious.   Past Medical History  Diagnosis Date  . Diabetes mellitus     TYPE 2  . Hypertension   . Hyperlipidemia   . Osteoporosis   . GERD (gastroesophageal reflux disease)   . CHF (congestive heart failure) (HCC)   . BBB (bundle branch block)     CHRONIC  LEFT BBB  . CHF (congestive heart failure) (HCC)   . Alzheimer disease   . Diastolic heart failure (HCC)   . CKD (chronic kidney disease), stage III   . Atrial fibrillation (HCC)   . Coronary artery disease   . Hyperlipemia     Patient Active Problem List   Diagnosis Date Noted  . Skin lesion on examination 08/06/2015  . CHF (congestive heart failure) (HCC) 05/11/2015  . Vitamin B 12 deficiency 05/11/2015  . Type 2 diabetes mellitus without complication (HCC)   . Hypertension   . Hyperlipidemia   . Osteoporosis   . GERD (gastroesophageal reflux disease)   . BBB (bundle branch block)     Past Surgical History  Procedure Laterality Date  . Cholecystectomy    . Abdominal hysterectomy    . Colonoscopy w/ polypectomy  7/2OO8       05/2010    NEOPLASTIC  POLYPS  . Cabg x 5  08/10/2011    DR BARTLE  . Coronary artery bypass graft  2012     Current Outpatient Rx  Name  Route  Sig  Dispense  Refill  . acetaminophen (TYLENOL) 500 MG tablet   Oral   Take 500 mg by mouth 2 (two) times daily.         . Calcium Carb-Cholecalciferol (CALCIUM PLUS VITAMIN D3) 600-800 MG-UNIT TABS   Oral   Take 1 tablet by mouth 2 (two) times daily.         . furosemide (LASIX) 20 MG tablet   Oral   Take 20 mg by mouth 2 (two) times daily.         Marland Kitchen lisinopril (PRINIVIL,ZESTRIL) 10 MG tablet   Oral   Take 10 mg by mouth daily.         . polyethylene glycol (MIRALAX / GLYCOLAX) packet   Oral   Take 17 g by mouth daily as needed.          . potassium citrate (UROCIT-K) 10 MEQ (1080 MG) SR tablet   Oral   Take 10 mEq by mouth 2 (two) times daily.            Allergies Actonel and Risedronate  Family History  Problem Relation Age of Onset  . Hypertension Mother   . Heart disease Father     Social History Social History  Substance Use Topics  . Smoking status: Former Games developer  .  Smokeless tobacco: Former NeurosurgeonUser    Quit date: 11/27/1980  . Alcohol Use: No    Review of Systems Constitutional: No fever/chills Eyes: No visual changes. ENT: No sore throat. No stiff neck no neck pain Cardiovascular: Denies chest pain. Respiratory: Denies shortness of breath. Gastrointestinal:   Positive vomiting.  No diarrhea.  No constipation. Genitourinary on see history of present illness Musculoskeletal: Negative lower extremity swelling Skin: Negative for rash. Neurological: Negative for headaches, focal weakness or numbness. 10-point ROS otherwise negative.  ____________________________________________   PHYSICAL EXAM:  VITAL SIGNS: ED Triage Vitals  Enc Vitals Group     BP 04/13/16 2011 113/80 mmHg     Pulse Rate 04/13/16 2011 102     Resp 04/13/16 2011 16     Temp 04/13/16 2011 97.5 F (36.4 C)     Temp Source 04/13/16 2011 Oral     SpO2 04/13/16 2011 96 %     Weight 04/13/16 2011 155 lb (70.308 kg)     Height  04/13/16 2011 5\' 4"  (1.626 m)     Head Cir --      Peak Flow --      Pain Score 04/13/16 2012 8     Pain Loc --      Pain Edu? --      Excl. in GC? --     Constitutional: Alert and oriented. Well appearing and in no acute distress. Eyes: Conjunctivae are normal. PERRL. EOMI. Head: Atraumatic. Nose: No congestion/rhinnorhea. Mouth/Throat: Mucous membranes are moist.  Oropharynx non-erythematous. Neck: No stridor.   Nontender with no meningismus Cardiovascular: Irregularly irregular Grossly normal heart sounds.  Good peripheral circulation. Respiratory: Normal respiratory effort.  No retractions. Lungs CTAB. Abdominal: Soft and nontender. No distention. No guarding no rebound Back:  There is no focal tenderness or step off there is no midline tenderness there are no lesions noted. there is no CVA tenderness Musculoskeletal: No lower extremity tenderness. No joint effusions, no DVT signs strong distal pulses no edema Neurologic:  Normal speech and language. No gross focal neurologic deficits are appreciated.  Skin:  Skin is warm, dry and intact. No rash noted. Psychiatric: Mood and affect are normal. Speech and behavior are normal.  ____________________________________________   LABS (all labs ordered are listed, but only abnormal results are displayed)  Labs Reviewed  BASIC METABOLIC PANEL - Abnormal; Notable for the following:    Glucose, Bld 133 (*)    BUN 24 (*)    Creatinine, Ser 1.41 (*)    GFR calc non Af Amer 32 (*)    GFR calc Af Amer 38 (*)    All other components within normal limits  CBC - Abnormal; Notable for the following:    RDW 16.2 (*)    All other components within normal limits  URINALYSIS COMPLETEWITH MICROSCOPIC ([facility] ONLY) - Abnormal; Notable for the following:    Color, Urine YELLOW (*)    APPearance HAZY (*)    Hgb urine dipstick 1+ (*)    Protein, ur 100 (*)    Leukocytes, UA 3+ (*)    Bacteria, UA RARE (*)    Squamous Epithelial / LPF 0-5 (*)     All other components within normal limits  TROPONIN I - Abnormal; Notable for the following:    Troponin I 0.04 (*)    All other components within normal limits  URINE CULTURE  HEPATIC FUNCTION PANEL  LIPASE, BLOOD  BRAIN NATRIURETIC PEPTIDE   ____________________________________________  EKG  I personally interpreted  any EKGs ordered by me or triage  Known left bundle-branch block with a likely atrial fibrillation rate 106 ____________________________________________  RADIOLOGY  I reviewed any imaging ordered by me or triage that were performed during my shift and, if possible, patient and/or family made aware of any abnormal findings. ____________________________________________   PROCEDURES  Procedure(s) performed: None  Critical Care performed: None  ____________________________________________   INITIAL IMPRESSION / ASSESSMENT AND PLAN / ED COURSE  Pertinent labs & imaging results that were available during my care of the patient were reviewed by me and considered in my medical decision making (see chart for details).  Patient with nausea, vomiting, decreased appetite last 2 days and what appears to be urinary tract infection. Abdomen is benign x-ray shows no evidence of obstruction having normal bowel movements, deep palpation in all fields elicits no discomfort there is no evidence of SBO or other acute intra-abdominal pathology at this time. Since creatinine is up which could be secondary to Lasix use or dehydration. The patient has significant CHF so we will give her very ginger IV fluid aliquots and give her Rocephin after culturing her urine. Patient and family very much in agreement with this plan. We will do to the hospital. ____________________________________________   FINAL CLINICAL IMPRESSION(S) / ED DIAGNOSES  Final diagnoses:  None      This chart was dictated using voice recognition software.  Despite best efforts to proofread,  errors can occur  which can change meaning.     Jeanmarie Plant, MD 04/13/16 2202

## 2016-04-13 NOTE — ED Notes (Addendum)
Patient presents with sveral weeks of N/V and abdominal distention that has worsened over the last few days. Poor PO intake per family report. Patient with generalized weakness. PMH significant for diastolic HF and A.fib - reports recent admission where she was taken off of her Lasix. PCP recently restarted medication at 20mg  PO BID.

## 2016-04-13 NOTE — H&P (Signed)
[facility] [place] [facility]   PATIENT NAME: [name]    MR#:  [id number]  DATE OF BIRTH:  Dec 18, 1925  DATE OF ADMISSION:  04/13/2016  PRIMARY CARE PHYSICIAN: No primary care provider on file.   REQUESTING/REFERRING PHYSICIAN: Alphonzo Lemmings, MD  CHIEF COMPLAINT:   Chief Complaint  Patient presents with  . Nausea  . Emesis  . Bloated    HISTORY OF PRESENT ILLNESS:  Dorraine Sliwa  is a 82 y.o. female who presents with nausea and vomiting with abd pain.  These symptoms began today.  She came to the ED and was found to have likely UTI on UA.  She also has history of diastolic CHF and recently restarted her lasix dose at 20 mg.  However, Family in room with patient states her nursing facility physician increased lasix dose to 40 mg daily.  She has some AKI on labs workup here today.  Hospitalists were called for admission  PAST MEDICAL HISTORY:   Past Medical History  Diagnosis Date  . Diabetes mellitus     TYPE 2  . Hypertension   . Hyperlipidemia   . Osteoporosis   . GERD (gastroesophageal reflux disease)   . Chronic diastolic CHF (congestive heart failure) (HCC)   . BBB (bundle branch block)     CHRONIC  LEFT BBB  . CHF (congestive heart failure) (HCC)   . Alzheimer disease   . Diastolic heart failure (HCC)   . CKD (chronic kidney disease), stage III   . Atrial fibrillation (HCC)   . Coronary artery disease   . Hyperlipemia     PAST SURGICAL HISTORY:   Past Surgical History  Procedure Laterality Date  . Cholecystectomy    . Abdominal hysterectomy    . Colonoscopy w/ polypectomy  7/2OO8       05/2010    NEOPLASTIC  POLYPS  . Cabg x 5  08/10/2011    DR BARTLE  . Coronary artery bypass graft  2012    SOCIAL HISTORY:   Social History  Substance Use Topics  . Smoking status: Former Games developer  . Smokeless tobacco: Former Neurosurgeon    Quit date: 11/27/1980  . Alcohol Use: No    FAMILY HISTORY:   Family History  Problem Relation Age of  Onset  . Hypertension Mother   . Heart disease Father     DRUG ALLERGIES:   Allergies  Allergen Reactions  . Actonel [Risedronate Sodium]   . Risedronate Rash    MEDICATIONS AT HOME:   Prior to Admission medications   Medication Sig Start Date End Date Taking? Authorizing Provider  acetaminophen (TYLENOL) 500 MG tablet Take 500 mg by mouth 2 (two) times daily.   Yes Historical Provider, MD  Calcium Carb-Cholecalciferol (CALCIUM PLUS VITAMIN D3) 600-800 MG-UNIT TABS Take 1 tablet by mouth 2 (two) times daily.   Yes Historical Provider, MD  furosemide (LASIX) 20 MG tablet Take 20 mg by mouth 2 (two) times daily.   Yes Historical Provider, MD  lisinopril (PRINIVIL,ZESTRIL) 10 MG tablet Take 10 mg by mouth daily.   Yes Historical Provider, MD  polyethylene glycol (MIRALAX / GLYCOLAX) packet Take 17 g by mouth daily as needed.    Yes Historical Provider, MD  potassium citrate (UROCIT-K) 10 MEQ (1080 MG) SR tablet Take 10 mEq by mouth 2 (two) times daily.    Yes Historical Provider, MD    REVIEW OF SYSTEMS:  Review of Systems  Constitutional: Negative for fever, chills, weight loss  and malaise/fatigue.  HENT: Negative for ear pain, hearing loss and tinnitus.   Eyes: Negative for blurred vision, double vision, pain and redness.  Respiratory: Negative for cough, hemoptysis and shortness of breath.   Cardiovascular: Negative for chest pain, palpitations, orthopnea and leg swelling.  Gastrointestinal: Positive for nausea, vomiting and abdominal pain. Negative for diarrhea and constipation.  Genitourinary: Negative for dysuria, frequency and hematuria.  Musculoskeletal: Negative for back pain, joint pain and neck pain.  Skin:       No acne, rash, or lesions  Neurological: Negative for dizziness, tremors, focal weakness and weakness.  Endo/Heme/Allergies: Negative for polydipsia. Does not bruise/bleed easily.  Psychiatric/Behavioral: Negative for depression. The patient is not  nervous/anxious and does not have insomnia.      VITAL SIGNS:   Filed Vitals:   04/13/16 2011 04/13/16 2100 04/13/16 2130 04/13/16 2200  BP: 113/80 134/73 112/81 118/85  Pulse: 102 106 102 88  Temp: 97.5 F (36.4 C)     TempSrc: Oral     Resp: 16 18 22 24   Height: 5\' 4"  (1.626 m)     Weight: 70.308 kg (155 lb)     SpO2: 96% 97% 96% 98%   Wt Readings from Last 3 Encounters:  04/13/16 70.308 kg (155 lb)  03/22/16 76.658 kg (169 lb)  11/05/15 75.206 kg (165 lb 12.8 oz)    PHYSICAL EXAMINATION:  Physical Exam  Vitals reviewed. Constitutional: She is oriented to person, place, and time. She appears well-developed and well-nourished. No distress.  HENT:  Head: Normocephalic and atraumatic.  Mouth/Throat: Oropharynx is clear and moist.  Eyes: Conjunctivae and EOM are normal. Pupils are equal, round, and reactive to light. No scleral icterus.  Neck: Normal range of motion. Neck supple. No JVD present. No thyromegaly present.  Cardiovascular: Normal rate, regular rhythm and intact distal pulses.  Exam reveals no gallop and no friction rub.   No murmur heard. Respiratory: Effort normal and breath sounds normal. No respiratory distress. She has no wheezes. She has no rales.  GI: Soft. Bowel sounds are normal. She exhibits no distension. There is tenderness (periumbilic/suprapubic).  Musculoskeletal: Normal range of motion. She exhibits no edema.  No arthritis, no gout  Lymphadenopathy:    She has no cervical adenopathy.  Neurological: She is alert and oriented to person, place, and time. No cranial nerve deficit.  No dysarthria, no aphasia  Skin: Skin is warm and dry. No rash noted. No erythema.  Psychiatric: She has a normal mood and affect. Her behavior is normal. Judgment and thought content normal.    LABORATORY PANEL:   CBC  Recent Labs Lab 04/13/16 2018  WBC 8.2  HGB 13.6  HCT 42.2  PLT 159    ------------------------------------------------------------------------------------------------------------------  Chemistries   Recent Labs Lab 04/13/16 2018  NA 135  K 4.4  CL 101  CO2 26  GLUCOSE 133*  BUN 24*  CREATININE 1.41*  CALCIUM 9.9  AST 24  ALT 15  ALKPHOS 79  BILITOT 0.6   ------------------------------------------------------------------------------------------------------------------  Cardiac Enzymes  Recent Labs Lab 04/13/16 2018  TROPONINI 0.04*   ------------------------------------------------------------------------------------------------------------------  RADIOLOGY:  Dg Abd Acute W/chest  04/13/2016  CLINICAL DATA:  Several week history of nausea, vomiting, and abdominal distension worsened over last few days, poor p.o. intake, generalized weakness, history hypertension, diabetes mellitus, CHF, Alzheimer's, atrial fibrillation, former smoker EXAM: DG ABDOMEN ACUTE W/ 1V CHEST COMPARISON:  Chest radiographs 03/22/2016, abdominal radiographs 01/17/2012 ; correlation CT abdomen/pelvis 12/26/2006 FINDINGS: Enlargement of cardiac silhouette post  CABG. Pulmonary vascular congestion. Calcified tortuous aorta. Interstitial infiltrates compatible with chronic CHF. No segmental consolidation, pleural effusion or pneumothorax. Surgical clips RIGHT upper quadrant likely cholecystectomy. Large calcification in the RIGHT mid abdomen corresponds to an area of calcified fat necrosis or calcified lymph node in the anterior RIGHT abdomen on prior CT. Extensive atherosclerotic calcifications. Nonobstructive bowel gas pattern without bowel dilatation or bowel wall thickening or free intraperitoneal air. Osseous demineralization with degenerative changes lumbar spine. IMPRESSION: Chronic CHF. No acute abdominal findings. Electronically Signed   By: Ulyses Southward M.D.   On: 04/13/2016 21:00    EKG:   Orders placed or performed during the hospital encounter of 04/13/16  . ED  EKG  . ED EKG  . EKG 12-Lead  . EKG 12-Lead    IMPRESSION AND PLAN:  Principal Problem:   UTI (lower urinary tract infection) - IV abx started in ED after UA and Urine culture sent.  Continue abx on admission and follow up culture results Active Problems:   AKI (acute kidney injury) (HCC) - gentle hydration to see if Cr will improve on AM labs   Type 2 diabetes mellitus without complication (HCC) - SSI with corresponding glucose checks   Hypertension - hold home antihypertensives as blood pressure is low end normotensive   Chronic diastolic CHF (congestive heart failure) (HCC) - continue home meds, though we will hold lasix dose for now.  Get cardiology consult to assist with diuretic dose adjustment   Hyperlipidemia - continue home meds  All the records are reviewed and case discussed with ED provider. Management plans discussed with the patient and/or family.  DVT PROPHYLAXIS: SubQ heparin  GI PROPHYLAXIS: None  ADMISSION STATUS: Inpatient  CODE STATUS: Full Code Status History    This patient does not have a recorded code status. Please follow your organizational policy for patients in this situation.      TOTAL TIME TAKING CARE OF THIS PATIENT: 45 minutes.    Seirra Kos FIELDING 04/13/2016, 10:22 PM  [facility]  Office  [phone]  CC: Primary care physician; No primary care provider on file.

## 2016-04-14 LAB — CREATININE, SERUM
CREATININE: 1.26 mg/dL — AB (ref 0.44–1.00)
GFR calc Af Amer: 43 mL/min — ABNORMAL LOW (ref >60)
GFR, EST NON AFRICAN AMERICAN: 37 mL/min — AB (ref >60)

## 2016-04-14 LAB — BASIC METABOLIC PANEL
ANION GAP: 6 (ref 5–15)
BUN: 21 mg/dL — ABNORMAL HIGH (ref 6–20)
CO2: 24 mmol/L (ref 22–32)
Calcium: 9 mg/dL (ref 8.9–10.3)
Chloride: 106 mmol/L (ref 101–111)
Creatinine, Ser: 1.29 mg/dL — ABNORMAL HIGH (ref 0.44–1.00)
GFR calc Af Amer: 42 mL/min — ABNORMAL LOW (ref >60)
GFR, EST NON AFRICAN AMERICAN: 36 mL/min — AB (ref >60)
GLUCOSE: 156 mg/dL — AB (ref 65–99)
POTASSIUM: 3.9 mmol/L (ref 3.5–5.1)
Sodium: 136 mmol/L (ref 135–145)

## 2016-04-14 LAB — URINE CULTURE

## 2016-04-14 LAB — CBC
HEMATOCRIT: 38.3 % (ref 35.0–47.0)
HEMOGLOBIN: 12.5 g/dL (ref 12.0–16.0)
MCH: 27 pg (ref 26.0–34.0)
MCHC: 32.7 g/dL (ref 32.0–36.0)
MCV: 82.8 fL (ref 80.0–100.0)
PLATELETS: 132 10*3/uL — AB (ref 150–440)
RBC: 4.63 MIL/uL (ref 3.80–5.20)
RDW: 16.1 % — AB (ref 11.5–14.5)
WBC: 5.7 10*3/uL (ref 3.6–11.0)

## 2016-04-14 LAB — GLUCOSE, CAPILLARY
GLUCOSE-CAPILLARY: 160 mg/dL — AB (ref 65–99)
GLUCOSE-CAPILLARY: 140 mg/dL — AB (ref 65–99)
GLUCOSE-CAPILLARY: 110 mg/dL — AB (ref 65–99)
Glucose-Capillary: 132 mg/dL — ABNORMAL HIGH (ref 65–99)

## 2016-04-14 LAB — MRSA PCR SCREENING: MRSA BY PCR: NEGATIVE

## 2016-04-14 LAB — TROPONIN I
TROPONIN I: 0.04 ng/mL — AB (ref <0.031)
Troponin I: 0.04 ng/mL — ABNORMAL HIGH (ref <0.031)
Troponin I: 0.05 ng/mL — ABNORMAL HIGH (ref <0.031)

## 2016-04-14 LAB — HEMOGLOBIN A1C: HEMOGLOBIN A1C: 6.5 % — AB (ref 4.0–6.0)

## 2016-04-14 MED ORDER — CAPSAICIN 0.025 % EX CREA
TOPICAL_CREAM | Freq: Two times a day (BID) | CUTANEOUS | Status: DC
  Administered 2016-04-14 – 2016-04-19 (×12): via TOPICAL
  Filled 2016-04-14: qty 56.6

## 2016-04-14 MED ORDER — CAPSAICIN 0.075 % EX CREA: TOPICAL_CREAM | Freq: Two times a day (BID) | CUTANEOUS | Status: DC

## 2016-04-14 MED ORDER — DEXTROSE 5 % IV SOLN
1.0000 g | INTRAVENOUS | Status: DC
  Administered 2016-04-14 – 2016-04-16 (×3): 1 g via INTRAVENOUS
  Filled 2016-04-14 (×4): qty 10

## 2016-04-14 MED ORDER — ENOXAPARIN SODIUM 40 MG/0.4ML ~~LOC~~ SOLN
40.0000 mg | SUBCUTANEOUS | Status: DC
  Administered 2016-04-14 – 2016-04-16 (×3): 40 mg via SUBCUTANEOUS
  Filled 2016-04-14 (×3): qty 0.4

## 2016-04-14 NOTE — Consult Note (Signed)
Reason for Consult: Congestive heart failure known coronary disease Referring Physician: Dr. Lance Coon hospitalist  [name] is an 82 y.o. female.  HPI: Patient presents with nausea vomiting abdominal pain symptoms started earlier before admission she was found to have a UTI has had a history of congestive heart failure diastolic dysfunction was treated with Lasix patient had been getting 40 mg of Lasix at her assisted living facility has some renal insufficiency here for follow-up evaluation. Patient has known coronary disease but denies significant chest pain or angina has mild shortness of breath minimal leg swelling. Patient has known congestive heart failure known coronary disease abnormal EKG and develop a cough possibly related to ACE inhibitor. Patient is here with her children who are able to give a fairly detailed history she's had reflux symptoms but now feels reasonably well  Past Medical History  Diagnosis Date  . Diabetes mellitus     TYPE 2  . Hypertension   . Hyperlipidemia   . Osteoporosis   . GERD (gastroesophageal reflux disease)   . Chronic diastolic CHF (congestive heart failure) ([place])   . BBB (bundle branch block)     CHRONIC  LEFT BBB  . CHF (congestive heart failure) ([place])   . Alzheimer disease   . Diastolic heart failure ([place])   . CKD (chronic kidney disease), stage III   . Atrial fibrillation ([place])   . Coronary artery disease   . Hyperlipemia     Past Surgical History  Procedure Laterality Date  . Cholecystectomy    . Abdominal hysterectomy    . Colonoscopy w/ polypectomy  7/2OO8       05/2010    NEOPLASTIC  POLYPS  . Cabg x 5  08/10/2011    DR BARTLE  . Coronary artery bypass graft  2012    Family History  Problem Relation Age of Onset  . Hypertension Mother   . Heart disease Father     Social History:  reports that she has quit smoking. She quit smokeless tobacco use about 35 years ago. She reports that she does not drink alcohol or use  illicit drugs.  Allergies:  Allergies  Allergen Reactions  . Actonel [Risedronate Sodium]   . Risedronate Rash    Medications: I have reviewed the patient's current medications.  Results for orders placed or performed during the hospital encounter of 04/13/16 (from the past 48 hour(s))  Basic metabolic panel     Status: Abnormal   Collection Time: 04/13/16  8:18 PM  Result Value Ref Range   Sodium 135 135 - 145 mmol/L   Potassium 4.4 3.5 - 5.1 mmol/L   Chloride 101 101 - 111 mmol/L   CO2 26 22 - 32 mmol/L   Glucose, Bld 133 (H) 65 - 99 mg/dL   BUN 24 (H) 6 - 20 mg/dL   Creatinine, Ser 1.41 (H) 0.44 - 1.00 mg/dL   Calcium 9.9 8.9 - 10.3 mg/dL   GFR calc non Af Amer 32 (L) >60 mL/min   GFR calc Af Amer 38 (L) >60 mL/min    Comment: (NOTE) The eGFR has been calculated using the CKD EPI equation. This calculation has not been validated in all clinical situations. eGFR's persistently <60 mL/min signify possible Chronic Kidney Disease.    Anion gap 8 5 - 15  CBC     Status: Abnormal   Collection Time: 04/13/16  8:18 PM  Result Value Ref Range   WBC 8.2 3.6 - 11.0 K/uL   RBC  5.10 3.80 - 5.20 MIL/uL   Hemoglobin 13.6 12.0 - 16.0 g/dL   HCT 42.2 35.0 - 47.0 %   MCV 82.8 80.0 - 100.0 fL   MCH 26.7 26.0 - 34.0 pg   MCHC 32.3 32.0 - 36.0 g/dL   RDW 16.2 (H) 11.5 - 14.5 %   Platelets 159 150 - 440 K/uL  Urinalysis complete, with microscopic     Status: Abnormal   Collection Time: 04/13/16  8:18 PM  Result Value Ref Range   Color, Urine YELLOW (A) YELLOW   APPearance HAZY (A) CLEAR   Glucose, UA NEGATIVE NEGATIVE mg/dL   Bilirubin Urine NEGATIVE NEGATIVE   Ketones, ur NEGATIVE NEGATIVE mg/dL   Specific Gravity, Urine 1.015 1.005 - 1.030   Hgb urine dipstick 1+ (A) NEGATIVE   pH 5.0 5.0 - 8.0   Protein, ur 100 (A) NEGATIVE mg/dL   Nitrite NEGATIVE NEGATIVE   Leukocytes, UA 3+ (A) NEGATIVE   RBC / HPF 0-5 0 - 5 RBC/hpf   WBC, UA 6-30 0 - 5 WBC/hpf   Bacteria, UA RARE (A)  NONE SEEN   Squamous Epithelial / LPF 0-5 (A) NONE SEEN   Mucous PRESENT    Hyaline Casts, UA PRESENT   Troponin I     Status: Abnormal   Collection Time: 04/13/16  8:18 PM  Result Value Ref Range   Troponin I 0.04 (H) <0.031 ng/mL    Comment: READ BACK AND VERIFIED WITH EMMA HUNTER RN 2100 04/13/16 MSS.        PERSISTENTLY INCREASED TROPONIN VALUES IN THE RANGE OF 0.04-0.49 ng/mL CAN BE SEEN IN:       -UNSTABLE ANGINA       -CONGESTIVE HEART FAILURE       -MYOCARDITIS       -CHEST TRAUMA       -ARRYHTHMIAS       -LATE PRESENTING MYOCARDIAL INFARCTION       -COPD   CLINICAL FOLLOW-UP RECOMMENDED.   Brain natriuretic peptide     Status: Abnormal   Collection Time: 04/13/16  8:18 PM  Result Value Ref Range   B Natriuretic Peptide 539.0 (H) 0.0 - 100.0 pg/mL  Hepatic function panel     Status: None   Collection Time: 04/13/16  8:18 PM  Result Value Ref Range   Total Protein 7.4 6.5 - 8.1 g/dL   Albumin 4.0 3.5 - 5.0 g/dL   AST 24 15 - 41 U/L   ALT 15 14 - 54 U/L   Alkaline Phosphatase 79 38 - 126 U/L   Total Bilirubin 0.6 0.3 - 1.2 mg/dL   Bilirubin, Direct 0.2 0.1 - 0.5 mg/dL   Indirect Bilirubin 0.4 0.3 - 0.9 mg/dL  Lipase, blood     Status: None   Collection Time: 04/13/16  8:18 PM  Result Value Ref Range   Lipase 23 11 - 51 U/L  CBC     Status: Abnormal   Collection Time: 04/13/16 11:36 PM  Result Value Ref Range   WBC 7.2 3.6 - 11.0 K/uL   RBC 5.07 3.80 - 5.20 MIL/uL   Hemoglobin 13.5 12.0 - 16.0 g/dL   HCT 41.3 35.0 - 47.0 %   MCV 81.5 80.0 - 100.0 fL   MCH 26.7 26.0 - 34.0 pg   MCHC 32.8 32.0 - 36.0 g/dL   RDW 16.5 (H) 11.5 - 14.5 %   Platelets 148 (L) 150 - 440 K/uL  Creatinine, serum     Status: Abnormal  Collection Time: 04/13/16 11:36 PM  Result Value Ref Range   Creatinine, Ser 1.26 (H) 0.44 - 1.00 mg/dL   GFR calc non Af Amer 37 (L) >60 mL/min   GFR calc Af Amer 43 (L) >60 mL/min    Comment: (NOTE) The eGFR has been calculated using the CKD EPI  equation. This calculation has not been validated in all clinical situations. eGFR's persistently <60 mL/min signify possible Chronic Kidney Disease.   Troponin I     Status: Abnormal   Collection Time: 04/13/16 11:36 PM  Result Value Ref Range   Troponin I 0.05 (H) <0.031 ng/mL    Comment: PREVIOUS RESULT CALLED TO EMMA HUNTER ON 04/13/16 AT 2100 BY MSS/TLB        PERSISTENTLY INCREASED TROPONIN VALUES IN THE RANGE OF 0.04-0.49 ng/mL CAN BE SEEN IN:       -UNSTABLE ANGINA       -CONGESTIVE HEART FAILURE       -MYOCARDITIS       -CHEST TRAUMA       -ARRYHTHMIAS       -LATE PRESENTING MYOCARDIAL INFARCTION       -COPD   CLINICAL FOLLOW-UP RECOMMENDED.   Glucose, capillary     Status: Abnormal   Collection Time: 04/13/16 11:56 PM  Result Value Ref Range   Glucose-Capillary 136 (H) 65 - 99 mg/dL   Comment 1 Notify RN   MRSA PCR Screening     Status: None   Collection Time: 04/14/16 12:12 AM  Result Value Ref Range   MRSA by PCR NEGATIVE NEGATIVE    Comment:        The GeneXpert MRSA Assay (FDA approved for NASAL specimens only), is one component of a comprehensive MRSA colonization surveillance program. It is not intended to diagnose MRSA infection nor to guide or monitor treatment for MRSA infections.   Troponin I     Status: Abnormal   Collection Time: 04/14/16  5:32 AM  Result Value Ref Range   Troponin I 0.04 (H) <0.031 ng/mL    Comment: PREVIOUS RESULT CALLED TO EMMA HUNTER AT 2100 ON 04/13/16 BY MSS.Marland KitchenMarland KitchenVandalia        PERSISTENTLY INCREASED TROPONIN VALUES IN THE RANGE OF 0.04-0.49 ng/mL CAN BE SEEN IN:       -UNSTABLE ANGINA       -CONGESTIVE HEART FAILURE       -MYOCARDITIS       -CHEST TRAUMA       -ARRYHTHMIAS       -LATE PRESENTING MYOCARDIAL INFARCTION       -COPD   CLINICAL FOLLOW-UP RECOMMENDED.   Basic metabolic panel     Status: Abnormal   Collection Time: 04/14/16  5:32 AM  Result Value Ref Range   Sodium 136 135 - 145 mmol/L   Potassium 3.9  3.5 - 5.1 mmol/L   Chloride 106 101 - 111 mmol/L   CO2 24 22 - 32 mmol/L   Glucose, Bld 156 (H) 65 - 99 mg/dL   BUN 21 (H) 6 - 20 mg/dL   Creatinine, Ser 1.29 (H) 0.44 - 1.00 mg/dL   Calcium 9.0 8.9 - 10.3 mg/dL   GFR calc non Af Amer 36 (L) >60 mL/min   GFR calc Af Amer 42 (L) >60 mL/min    Comment: (NOTE) The eGFR has been calculated using the CKD EPI equation. This calculation has not been validated in all clinical situations. eGFR's persistently <60 mL/min signify possible Chronic Kidney Disease.  Anion gap 6 5 - 15  CBC     Status: Abnormal   Collection Time: 04/14/16  5:32 AM  Result Value Ref Range   WBC 5.7 3.6 - 11.0 K/uL   RBC 4.63 3.80 - 5.20 MIL/uL   Hemoglobin 12.5 12.0 - 16.0 g/dL   HCT 38.3 35.0 - 47.0 %   MCV 82.8 80.0 - 100.0 fL   MCH 27.0 26.0 - 34.0 pg   MCHC 32.7 32.0 - 36.0 g/dL   RDW 16.1 (H) 11.5 - 14.5 %   Platelets 132 (L) 150 - 440 K/uL  Glucose, capillary     Status: Abnormal   Collection Time: 04/14/16  7:23 AM  Result Value Ref Range   Glucose-Capillary 132 (H) 65 - 99 mg/dL  Glucose, capillary     Status: Abnormal   Collection Time: 04/14/16 11:27 AM  Result Value Ref Range   Glucose-Capillary 160 (H) 65 - 99 mg/dL   Comment 1 Notify RN   Troponin I     Status: Abnormal   Collection Time: 04/14/16 11:46 AM  Result Value Ref Range   Troponin I 0.04 (H) <0.031 ng/mL    Comment: READ BACK AND VERIFIED WITH SHERYL GREEN 04/14/16 1230 SGD        PERSISTENTLY INCREASED TROPONIN VALUES IN THE RANGE OF 0.04-0.49 ng/mL CAN BE SEEN IN:       -UNSTABLE ANGINA       -CONGESTIVE HEART FAILURE       -MYOCARDITIS       -CHEST TRAUMA       -ARRYHTHMIAS       -LATE PRESENTING MYOCARDIAL INFARCTION       -COPD   CLINICAL FOLLOW-UP RECOMMENDED.     Dg Abd Acute W/chest  04/13/2016  CLINICAL DATA:  Several week history of nausea, vomiting, and abdominal distension worsened over last few days, poor p.o. intake, generalized weakness, history  hypertension, diabetes mellitus, CHF, Alzheimer's, atrial fibrillation, former smoker EXAM: DG ABDOMEN ACUTE W/ 1V CHEST COMPARISON:  Chest radiographs 03/22/2016, abdominal radiographs 01/17/2012 ; correlation CT abdomen/pelvis 12/26/2006 FINDINGS: Enlargement of cardiac silhouette post CABG. Pulmonary vascular congestion. Calcified tortuous aorta. Interstitial infiltrates compatible with chronic CHF. No segmental consolidation, pleural effusion or pneumothorax. Surgical clips RIGHT upper quadrant likely cholecystectomy. Large calcification in the RIGHT mid abdomen corresponds to an area of calcified fat necrosis or calcified lymph node in the anterior RIGHT abdomen on prior CT. Extensive atherosclerotic calcifications. Nonobstructive bowel gas pattern without bowel dilatation or bowel wall thickening or free intraperitoneal air. Osseous demineralization with degenerative changes lumbar spine. IMPRESSION: Chronic CHF. No acute abdominal findings. Electronically Signed   By: Lavonia Dana M.D.   On: 04/13/2016 21:00    Review of Systems  Constitutional: Positive for malaise/fatigue.  Skin: Negative.   Neurological: Positive for weakness.   Blood pressure 114/74, pulse 103, temperature 97.6 F (36.4 C), temperature source Oral, resp. rate 20, height _0  (1.626 m), weight 76.114 kg (167 lb 12.8 oz), SpO2 98 %. Physical Exam  Assessment/Plan: Congestive heart failure diastolic dysfunction Edema lower extremities Shortness of breath Hypertension GERD Coronary bypass surgery Abnormal EKG with left bundle branch block Atrial fibrillation Lipidemia Cough possibly related to ACE inhibitor Chronic renal insufficiency Diabetes type 2 uncomplicated Dementia Alzheimer's type mild Urinary tract infection . PLAN  1 agree with admission to telemetry to monitor heart rate and atrial fibrillation 2 diuretics for congestive heart failure 3 oxygen therapy as necessary 4 continue hypertension control 5  agree with diabetes management currently 6 continue lipid management consider statin therapy 7 rate control for atrial fibrillation 8 not a good anticoagulation candidate for A. Fib 9 recommend conservative medical therapy 10 with discontinue ACE inhibitor because of cough 11 have the patient follow-up with her next outpatient with cardiology with Dr. Ubaldo Glassing next week  CALLWOOD,DWAYNE D. 04/14/2016, 3:04 PM

## 2016-04-14 NOTE — Progress Notes (Signed)
Pt c/o pain to R shoulder, unrelieved by tylenol and repositioning. MD Dr. Sheryle Hailiamond notified, orders received. RN will administer and continue to monitor. Syliva Overmanassie A Kianah Harries, RN

## 2016-04-14 NOTE — Care Management (Signed)
From [facility]. UTI

## 2016-04-14 NOTE — Progress Notes (Signed)
Pharmacy Antibiotic Note  [name] is a 82 y.o. female admitted on 04/13/2016 with lower UTI.  Pharmacy has been consulted for ceftriaxone dosing.  Plan: Ceftriaxone 1 gram q 24 hours ordered.  Height: 5\' 4"  (162.6 cm) Weight: 155 lb (70.308 kg) IBW/kg (Calculated) : 54.7  Temp (24hrs), Avg:97.5 F (36.4 C), Min:97.5 F (36.4 C), Max:97.5 F (36.4 C)   Recent Labs Lab 04/13/16 2018 04/13/16 2336  WBC 8.2 7.2  CREATININE 1.41*  --     Estimated Creatinine Clearance: 27 mL/min (by C-G formula based on Cr of 1.41).    Allergies  Allergen Reactions  . Actonel [Risedronate Sodium]   . Risedronate Rash    Antimicrobials this admission: ceftriaxone  >>    >>   Dose adjustments this admission:   Microbiology results:  5/18 UCx: pending  5/18 MRSA PCR: pending  5/18 UA: LE(+) NO2(-) WBC 6-30  Thank you for allowing pharmacy to be a part of this patient's care.  Oluchi Pucci S 04/14/2016 12:08 AM

## 2016-04-14 NOTE — Progress Notes (Signed)
[employer] - [place] at [facility]   PATIENT NAME: [name]    MR#:  [id number]  DATE OF BIRTH:  01/17/1926  SUBJECTIVE:  CHIEF COMPLAINT:   Chief Complaint  Patient presents with  . Nausea  . Emesis  . Bloated     Came with dry cough for 20-3 weeks but have nausea and vomit for few days.   Have UTI.  REVIEW OF SYSTEMS:  CONSTITUTIONAL: No fever, positive for fatigue or weakness.  EYES: No blurred or double vision.  EARS, NOSE, AND THROAT: No tinnitus or ear pain.  RESPIRATORY: No cough, shortness of breath, wheezing or hemoptysis.  CARDIOVASCULAR: No chest pain, orthopnea, edema.  GASTROINTESTINAL: No nausea, vomiting, diarrhea or abdominal pain.  GENITOURINARY: No dysuria, hematuria.  ENDOCRINE: No polyuria, nocturia,  HEMATOLOGY: No anemia, easy bruising or bleeding SKIN: No rash or lesion. MUSCULOSKELETAL: No joint pain or arthritis.   NEUROLOGIC: No tingling, numbness, weakness.  PSYCHIATRY: No anxiety or depression.   ROS  DRUG ALLERGIES:   Allergies  Allergen Reactions  . Actonel [Risedronate Sodium]   . Risedronate Rash    VITALS:  Blood pressure 114/74, pulse 103, temperature 97.6 F (36.4 C), temperature source Oral, resp. rate 20, height 5\' 4"  (1.626 m), weight 76.114 kg (167 lb 12.8 oz), SpO2 98 %.  PHYSICAL EXAMINATION:  GENERAL:  82 y.o.-year-old patient lying in the bed with no acute distress.  EYES: Pupils equal, round, reactive to light and accommodation. No scleral icterus. Extraocular muscles intact.  HEENT: Head atraumatic, normocephalic. Oropharynx and nasopharynx clear.  NECK:  Supple, no jugular venous distention. No thyroid enlargement, no tenderness.  LUNGS: Normal breath sounds bilaterally, no wheezing, rales,rhonchi or crepitation. No use of accessory muscles of respiration.  CARDIOVASCULAR: S1, S2 normal. No murmurs, rubs, or gallops.  ABDOMEN: Soft, nontender, nondistended. Bowel sounds present. No organomegaly  or mass.  EXTREMITIES: No pedal edema, cyanosis, or clubbing.  NEUROLOGIC: Cranial nerves II through XII are intact. Muscle strength 5/5 in all extremities. Sensation intact. Gait not checked.  PSYCHIATRIC: The patient is alert and oriented x 3.  SKIN: No obvious rash, lesion, or ulcer.   Physical Exam LABORATORY PANEL:   CBC  Recent Labs Lab 04/14/16 0532  WBC 5.7  HGB 12.5  HCT 38.3  PLT 132*   ------------------------------------------------------------------------------------------------------------------  Chemistries   Recent Labs Lab 04/13/16 2018  04/14/16 0532  NA 135  --  136  K 4.4  --  3.9  CL 101  --  106  CO2 26  --  24  GLUCOSE 133*  --  156*  BUN 24*  --  21*  CREATININE 1.41*  < > 1.29*  CALCIUM 9.9  --  9.0  AST 24  --   --   ALT 15  --   --   ALKPHOS 79  --   --   BILITOT 0.6  --   --   < > = values in this interval not displayed. ------------------------------------------------------------------------------------------------------------------  Cardiac Enzymes  Recent Labs Lab 04/14/16 0532 04/14/16 1146  TROPONINI 0.04* 0.04*   ------------------------------------------------------------------------------------------------------------------  RADIOLOGY:  Dg Abd Acute W/chest  04/13/2016  CLINICAL DATA:  Several week history of nausea, vomiting, and abdominal distension worsened over last few days, poor p.o. intake, generalized weakness, history hypertension, diabetes mellitus, CHF, Alzheimer's, atrial fibrillation, former smoker EXAM: DG ABDOMEN ACUTE W/ 1V CHEST COMPARISON:  Chest radiographs 03/22/2016, abdominal radiographs 01/17/2012 ; correlation CT abdomen/pelvis 12/26/2006 FINDINGS: Enlargement of cardiac silhouette post  CABG. Pulmonary vascular congestion. Calcified tortuous aorta. Interstitial infiltrates compatible with chronic CHF. No segmental consolidation, pleural effusion or pneumothorax. Surgical clips RIGHT upper quadrant likely  cholecystectomy. Large calcification in the RIGHT mid abdomen corresponds to an area of calcified fat necrosis or calcified lymph node in the anterior RIGHT abdomen on prior CT. Extensive atherosclerotic calcifications. Nonobstructive bowel gas pattern without bowel dilatation or bowel wall thickening or free intraperitoneal air. Osseous demineralization with degenerative changes lumbar spine. IMPRESSION: Chronic CHF. No acute abdominal findings. Electronically Signed   By: Ulyses Southward M.D.   On: 04/13/2016 21:00    ASSESSMENT AND PLAN:   Principal Problem:   UTI (lower urinary tract infection) Active Problems:   Type 2 diabetes mellitus without complication (HCC)   Hypertension   Hyperlipidemia   Chronic diastolic CHF (congestive heart failure) (HCC)   AKI (acute kidney injury) (HCC)  * UTI   IV rocephin, follow cultures  * Ac on Ch renal failure   Due to voimiting and lasix   Monitor.  * Diabetes   SSI  * hypertension   Hold home meds.   Will d./c lisinopril on discharge due to dry cough  * Ch diastolic CHF   Stable, Cont to monitor for now.  * Hyperlipidemia   Statin  * generalized weakness  Get PT eval, she lives in Assisted living place.   All the records are reviewed and case discussed with Care Management/Social Workerr. Management plans discussed with the patient, family and they are in agreement.  CODE STATUS: Full.  TOTAL TIME TAKING CARE OF THIS PATIENT: 35 minutes.     POSSIBLE D/C IN 1-2 DAYS, DEPENDING ON CLINICAL CONDITION.   Altamese Dilling M.D on 04/14/2016   Between 7am to 6pm - Pager - [phone]  After 6pm go to [email or web address] - Social research officer, government  [facility] [place] [facility]  Office  [phone]  CC: Primary care physician; No primary care provider on file.  Note: This dictation was prepared with Dragon dictation along with smaller phrase technology. Any transcriptional errors that result from this process are  unintentional.

## 2016-04-15 LAB — CBC
HCT: 41.7 % (ref 35.0–47.0)
Hemoglobin: 13.4 g/dL (ref 12.0–16.0)
MCH: 26.8 pg (ref 26.0–34.0)
MCHC: 32.2 g/dL (ref 32.0–36.0)
MCV: 83.1 fL (ref 80.0–100.0)
PLATELETS: 142 10*3/uL — AB (ref 150–440)
RBC: 5.01 MIL/uL (ref 3.80–5.20)
RDW: 16.5 % — AB (ref 11.5–14.5)
WBC: 6.9 10*3/uL (ref 3.6–11.0)

## 2016-04-15 LAB — GLUCOSE, CAPILLARY
GLUCOSE-CAPILLARY: 157 mg/dL — AB (ref 65–99)
GLUCOSE-CAPILLARY: 115 mg/dL — AB (ref 65–99)
GLUCOSE-CAPILLARY: 138 mg/dL — AB (ref 65–99)
Glucose-Capillary: 155 mg/dL — ABNORMAL HIGH (ref 65–99)

## 2016-04-15 MED ORDER — CALCIUM CARBONATE-VITAMIN D 500-200 MG-UNIT PO TABS
1.0000 | ORAL_TABLET | Freq: Two times a day (BID) | ORAL | Status: DC
  Administered 2016-04-15 – 2016-04-19 (×9): 1 via ORAL
  Filled 2016-04-15 (×9): qty 1

## 2016-04-15 MED ORDER — FUROSEMIDE 20 MG PO TABS
20.0000 mg | ORAL_TABLET | Freq: Two times a day (BID) | ORAL | Status: DC
  Administered 2016-04-15 – 2016-04-19 (×8): 20 mg via ORAL
  Filled 2016-04-15 (×8): qty 1

## 2016-04-15 MED ORDER — LISINOPRIL 10 MG PO TABS
10.0000 mg | ORAL_TABLET | Freq: Every day | ORAL | Status: DC
  Administered 2016-04-15 – 2016-04-19 (×5): 10 mg via ORAL
  Filled 2016-04-15 (×5): qty 1

## 2016-04-15 NOTE — Evaluation (Signed)
Physical Therapy Evaluation Patient Details Name: [name] I Quiggle MRN: [id number] DOB: [birth year] Today's Date: 04/15/2016   History of Present Illness  82 yo female resident of [facility] home was admitted with UTI, CHF and AKI and PMHx:  CHF, DM, CABG.  Clinical Impression  Pt is able to walk with help but is tending to get into unsafe position with walker and with turns, obstacle clearance.  Due to her home set up with stairs, will try steps in AM and see if pt can transition directly to ALF versus having to get her to SNF.  Transitioned this information to nursing and case manager.    Follow Up Recommendations SNF;Other (comment) (unless pt can demonstrate stair climbing ability tomorrow)    Equipment Recommendations  None recommended by PT    Recommendations for Other Services Rehab consult     Precautions / Restrictions Precautions Precautions: Fall (telemetry) Restrictions Weight Bearing Restrictions: No      Mobility  Bed Mobility               General bed mobility comments: in chair when PT arrived  Transfers Overall transfer level: Needs assistance Equipment used: Rolling walker (2 wheeled) Transfers: Sit to/from UGI CorporationStand;Stand Pivot Transfers Sit to Stand: Supervision;Min guard Stand pivot transfers: Supervision;Min guard       General transfer comment: reminders for safety and clearing turns, obstacles  Ambulation/Gait Ambulation/Gait assistance: Min guard;Min assist Ambulation Distance (Feet): 80 Feet Assistive device: Rolling walker (2 wheeled);1 person hand held assist Gait Pattern/deviations: Step-to pattern;Step-through pattern;Shuffle;Wide base of support Gait velocity: reduced Gait velocity interpretation: Below normal speed for age/gender General Gait Details: Pt is struggling to stay in walker with turns and close to obstacles  Stairs            Wheelchair Mobility    Modified Rankin (Stroke Patients Only)        Balance Overall balance assessment: Needs assistance Sitting-balance support: Feet supported Sitting balance-Leahy Scale: Good   Postural control: Posterior lean Standing balance support: Bilateral upper extremity supported Standing balance-Leahy Scale: Fair Standing balance comment: using walker as soon as she starts to step                             Pertinent Vitals/Pain Pain Assessment: No/denies pain    Home Living Family/patient expects to be discharged to:: Assisted living               Home Equipment: Walker - 2 wheels;Grab bars - toilet;Shower seat      Prior Function Level of Independence: Independent with assistive device(s)         Comments: has help for housekeeping and meals at ALF     Hand Dominance        Extremity/Trunk Assessment   Upper Extremity Assessment: Overall WFL for tasks assessed           Lower Extremity Assessment: Overall WFL for tasks assessed      Cervical / Trunk Assessment: Kyphotic  Communication   Communication: No difficulties  Cognition Arousal/Alertness: Awake/alert Behavior During Therapy: Impulsive Overall Cognitive Status: History of cognitive impairments - at baseline       Memory: Decreased recall of precautions;Decreased short-term memory              General Comments General comments (skin integrity, edema, etc.): Pt has family in her room with some information that pt has steps and limited help at ALF.  Exercises        Assessment/Plan    PT Assessment Patient needs continued PT services  PT Diagnosis Difficulty walking   PT Problem List Decreased strength;Decreased range of motion;Decreased activity tolerance;Decreased balance;Decreased mobility;Decreased coordination;Decreased cognition;Decreased knowledge of use of DME;Decreased safety awareness;Decreased knowledge of precautions  PT Treatment Interventions DME instruction;Gait training;Stair training;Functional  mobility training;Therapeutic activities;Therapeutic exercise;Balance training;Neuromuscular re-education;Patient/family education   PT Goals (Current goals can be found in the Care Plan section) Acute Rehab PT Goals Patient Stated Goal: to get home soon PT Goal Formulation: With patient/family Time For Goal Achievement: 04/29/16 Potential to Achieve Goals: Good    Frequency Min 2X/week   Barriers to discharge Inaccessible home environment;Decreased caregiver support (ALF has limited staff due to 5 residents) will have 5 steps to enter the house    Co-evaluation               End of Session Equipment Utilized During Treatment: Gait belt Activity Tolerance: Patient tolerated treatment well Patient left: in chair;with call bell/phone within reach;with chair alarm set;with family/visitor present Nurse Communication: Mobility status         Time: 1610-9604 PT Time Calculation (min) (ACUTE ONLY): 27 min   Charges:   PT Evaluation $PT Eval Low Complexity: 1 Procedure PT Treatments $Gait Training: 8-22 mins   PT G CodesIvar Drape 27-Apr-2016, 5:42 PM    Samul Dada, PT MS Acute Rehab Dept. Number: [facility] [id number] and [facility] [phone]

## 2016-04-15 NOTE — Progress Notes (Signed)
[facility] - [place] [facility]   PATIENT NAME: [name]    MR#:  [id number]  DATE OF BIRTH:  06/10/1926  SUBJECTIVE:   Patient is here due to abdominal pain nausea and vomiting at have a urinary tract infection and also generalized weakness. Feels better. Daughter is at bedside. Await physical therapy evaluation.  REVIEW OF SYSTEMS:    Review of Systems  Constitutional: Negative for fever and chills.  HENT: Negative for congestion and tinnitus.   Eyes: Negative for blurred vision and double vision.  Respiratory: Negative for cough, shortness of breath and wheezing.   Cardiovascular: Negative for chest pain, orthopnea and PND.  Gastrointestinal: Negative for nausea, vomiting, abdominal pain and diarrhea.  Genitourinary: Negative for dysuria and hematuria.  Neurological: Positive for weakness. Negative for dizziness, sensory change and focal weakness.  All other systems reviewed and are negative.   Nutrition: Heart healthy call modified. Tolerating Diet: Yes Tolerating PT: Await Eval.     DRUG ALLERGIES:   Allergies  Allergen Reactions  . Actonel [Risedronate Sodium]   . Risedronate Rash    VITALS:  Blood pressure 114/83, pulse 86, temperature 98 F (36.7 C), temperature source Oral, resp. rate 18, height 5\' 4"  (1.626 m), weight 70.67 kg (155 lb 12.8 oz), SpO2 99 %.  PHYSICAL EXAMINATION:   Physical Exam  GENERAL:  [age] y.o.-year-old patient sitting up in chair in no acute distress.  EYES: Pupils equal, round, reactive to light and accommodation. No scleral icterus. Extraocular muscles intact.  HEENT: Head atraumatic, normocephalic. Oropharynx and nasopharynx clear.  NECK:  Supple, no jugular venous distention. No thyroid enlargement, no tenderness.  LUNGS: Normal breath sounds bilaterally, no wheezing, rales, rhonchi. No use of accessory muscles of respiration.  CARDIOVASCULAR: S1, S2 normal. No murmurs, rubs, or gallops.  ABDOMEN: Soft,  nontender, nondistended. Bowel sounds present. No organomegaly or mass.  EXTREMITIES: No cyanosis, clubbing or edema b/l.    NEUROLOGIC: Cranial nerves II through XII are intact. No focal Motor or sensory deficits b/l.   PSYCHIATRIC: The patient is alert and oriented x 3.  SKIN: No obvious rash, lesion, or ulcer.    LABORATORY PANEL:   CBC  Recent Labs Lab 04/15/16 0611  WBC 6.9  HGB 13.4  HCT 41.7  PLT 142*   ------------------------------------------------------------------------------------------------------------------  Chemistries   Recent Labs Lab 04/13/16 2018  04/14/16 0532  NA 135  --  136  K 4.4  --  3.9  CL 101  --  106  CO2 26  --  24  GLUCOSE 133*  --  156*  BUN 24*  --  21*  CREATININE 1.41*  < > 1.29*  CALCIUM 9.9  --  9.0  AST 24  --   --   ALT 15  --   --   ALKPHOS 79  --   --   BILITOT 0.6  --   --   < > = values in this interval not displayed. ------------------------------------------------------------------------------------------------------------------  Cardiac Enzymes  Recent Labs Lab 04/14/16 1146  TROPONINI 0.04*   ------------------------------------------------------------------------------------------------------------------  RADIOLOGY:  Dg Abd Acute W/chest  04/13/2016  CLINICAL DATA:  Several week history of nausea, vomiting, and abdominal distension worsened over last few days, poor p.o. intake, generalized weakness, history hypertension, diabetes mellitus, CHF, Alzheimer's, atrial fibrillation, former smoker EXAM: DG ABDOMEN ACUTE W/ 1V CHEST COMPARISON:  Chest radiographs 03/22/2016, abdominal radiographs 01/17/2012 ; correlation CT abdomen/pelvis 12/26/2006 FINDINGS: Enlargement of cardiac silhouette post CABG. Pulmonary vascular congestion. Calcified  tortuous aorta. Interstitial infiltrates compatible with chronic CHF. No segmental consolidation, pleural effusion or pneumothorax. Surgical clips RIGHT upper quadrant likely  cholecystectomy. Large calcification in the RIGHT mid abdomen corresponds to an area of calcified fat necrosis or calcified lymph node in the anterior RIGHT abdomen on prior CT. Extensive atherosclerotic calcifications. Nonobstructive bowel gas pattern without bowel dilatation or bowel wall thickening or free intraperitoneal air. Osseous demineralization with degenerative changes lumbar spine. IMPRESSION: Chronic CHF. No acute abdominal findings. Electronically Signed   By: Ulyses Southward M.D.   On: 04/13/2016 21:00     ASSESSMENT AND PLAN:   82 year old female with past medical history of hypertension, diabetes, hyperlipidemia, osteoporosis, GERD, chronic diastolic CHF, Alzheimer's dementia who presents to the hospital due to weakness nausea and abdominal pain and noted to have a urinary tract infection.  1. Urinary tract infection-cont. Ceftriaxone.  - follow urine cultures.  Afebrile, hemodynamically stable.   2. Generalized weakness - due to UTI & deconditioning.  - cont. IV abx and await PT eval.   3. DM - cont. SSI and follow BS  4. HTN - cont. Lisinopril.    5. ARF - resolved w/ IV fluids and treatment of underlying UTI.     All the records are reviewed and case discussed with Care Management/Social Workerr. Management plans discussed with the patient, family and they are in agreement.  CODE STATUS: Full  DVT Prophylaxis: Lovenox  TOTAL TIME TAKING CARE OF THIS PATIENT: 30 minutes.   POSSIBLE D/C IN 1-2 DAYS, DEPENDING ON CLINICAL CONDITION.   Houston Siren M.D on 04/15/2016 at 1:57 PM  Between 7am to 6pm - Pager - [phone]  After 6pm go to [email or web address] - password [email or web address] [facility]  [place] [facility]  Office  [phone]  CC: Primary care physician; No primary care provider on file.

## 2016-04-15 NOTE — Progress Notes (Signed)
PT Cancellation Note  Patient Details Name: [name] I Longsworth MRN: [id number] DOB: September 24, 1926   Cancelled Treatment:    Reason Eval/Treat Not Completed: Patient at procedure or test/unavailable.  Try later when pt is available.   Ivar DrapeStout, Matilda Fleig E 04/15/2016, 11:34 AM    Samul Dadauth Angle Dirusso, PT MS Acute Rehab Dept. Number: [facility] [phone] and [facility] [phone]

## 2016-04-16 ENCOUNTER — Inpatient Hospital Stay: Payer: Medicare Other

## 2016-04-16 LAB — GLUCOSE, CAPILLARY
GLUCOSE-CAPILLARY: 122 mg/dL — AB (ref 65–99)
Glucose-Capillary: 127 mg/dL — ABNORMAL HIGH (ref 65–99)
Glucose-Capillary: 140 mg/dL — ABNORMAL HIGH (ref 65–99)
Glucose-Capillary: 170 mg/dL — ABNORMAL HIGH (ref 65–99)

## 2016-04-16 MED ORDER — DEXTROSE 5 % IV SOLN
250.0000 mg | INTRAVENOUS | Status: DC
  Administered 2016-04-16: 250 mg via INTRAVENOUS
  Filled 2016-04-16 (×2): qty 250

## 2016-04-16 MED ORDER — ALBUTEROL SULFATE (2.5 MG/3ML) 0.083% IN NEBU
2.5000 mg | INHALATION_SOLUTION | Freq: Four times a day (QID) | RESPIRATORY_TRACT | Status: DC
  Administered 2016-04-16 – 2016-04-19 (×11): 2.5 mg via RESPIRATORY_TRACT
  Filled 2016-04-16 (×12): qty 3

## 2016-04-16 MED ORDER — HYDROCOD POLST-CPM POLST ER 10-8 MG/5ML PO SUER
5.0000 mL | Freq: Two times a day (BID) | ORAL | Status: DC | PRN
  Administered 2016-04-16 – 2016-04-18 (×4): 5 mL via ORAL
  Filled 2016-04-16 (×4): qty 5

## 2016-04-16 MED ORDER — GUAIFENESIN 100 MG/5ML PO SOLN
5.0000 mL | ORAL | Status: DC | PRN
  Administered 2016-04-16: 100 mg via ORAL
  Filled 2016-04-16: qty 10

## 2016-04-16 NOTE — Progress Notes (Signed)
Physical Therapy Treatment Patient Details Name: [name] MRN: [id number] DOB: [birth year] Today's Date: 04/16/2016    History of Present Illness 82 yo female resident of [facility] home was admitted with UTI, CHF and AKI and PMHx:  CHF, DM, CABG.    PT Comments    Pt is expecting to have x-ray today to see if her lungs are clear enough to discharge back to ALF.  Her original order for SNF was expected due to assistance needed to get around ALF including steps, but with her current ability she can demonstrate success with staff assist for transfers and gait.  Follow Up Recommendations  Home health PT;Supervision/Assistance - 24 hour (family care home has agreed to supervise and assist all gait)     Equipment Recommendations  None recommended by PT    Recommendations for Other Services Rehab consult     Precautions / Restrictions Precautions Precautions: Fall (telemetry) Restrictions Weight Bearing Restrictions: No    Mobility  Bed Mobility               General bed mobility comments: up when PT arrived  Transfers Overall transfer level: Needs assistance Equipment used: Rolling walker (2 wheeled) Transfers: Sit to/from UGI CorporationStand;Stand Pivot Transfers Sit to Stand: Supervision;Min guard Stand pivot transfers: Supervision;Min guard       General transfer comment: prompts for hand placement and sequence  Ambulation/Gait Ambulation/Gait assistance: Min guard Ambulation Distance (Feet): 3 Feet Assistive device: Rolling walker (2 wheeled);1 person hand held assist Gait Pattern/deviations: Step-through pattern;Wide base of support;Trunk flexed (reaching for support of furniture) Gait velocity: reduced Gait velocity interpretation: Below normal speed for age/gender General Gait Details: low distances as pt walked up the steps today   Stairs Stairs: Yes Stairs assistance: Min guard Stair Management: Two rails;Alternating pattern;Forwards Number of  Stairs: 4 General stair comments: up and down stairs with PT cueing and pt following instructions well  Wheelchair Mobility    Modified Rankin (Stroke Patients Only)       Balance     Sitting balance-Leahy Scale: Good       Standing balance-Leahy Scale: Fair Standing balance comment: fair- dynamic standing balance                    Cognition Arousal/Alertness: Awake/alert Behavior During Therapy: Impulsive Overall Cognitive Status: History of cognitive impairments - at baseline       Memory: Decreased recall of precautions;Decreased short-term memory              Exercises      General Comments General comments (skin integrity, edema, etc.): Pt is SOB after stair climbing and sits to catch her breath, but is also coughing productively and having a chest x-ray today      Pertinent Vitals/Pain Pain Assessment: Faces Faces Pain Scale: Hurts even more Pain Location: upper back with a cough Pain Descriptors / Indicators: Tightness Pain Intervention(s): Monitored during session;Premedicated before session;Repositioned    Home Living                      Prior Function            PT Goals (current goals can now be found in the care plan section) Progress towards PT goals: Progressing toward goals    Frequency  Min 2X/week    PT Plan Discharge plan needs to be updated    Co-evaluation             End  of Session Equipment Utilized During Treatment: Gait belt Activity Tolerance: Patient tolerated treatment well Patient left: in chair;with call bell/phone within reach;with chair alarm set;with family/visitor present     Time: 1125-1204 PT Time Calculation (min) (ACUTE ONLY): 39 min  Charges:  $Gait Training: 23-37 mins $Therapeutic Activity: 8-22 mins                    G Codes:      Ivar Drape 04/25/2016, 1:45 PM    Samul Dada, PT MS Acute Rehab Dept. Number: [facility] [id number] and [facility] [phone]

## 2016-04-16 NOTE — Progress Notes (Signed)
Complaints of cough this shift with minimal relief with prn meds.  Productive cough with ?bloodtinged mucous.  Pt also reports sore throat.  Tylenol given earlier.

## 2016-04-16 NOTE — Care Management Note (Addendum)
Case Management Note  Patient Details  Name: Paula Comptonmmer I Abood MRN: [id number] Date of Birth: [birth year]  Subjective/Objective:     Discharged back to [facility] today. Home Health orders called to Linus OrnAbby Perkins at [facility] However, per Abby, they do not accept [facility] Medicare. This Clinical research associatewriter then began calling around to find a [facility] provider that would accept [employer] Medicare insurance. Elnita Maxwellheryl at [facility] suggested that [facility] per [facility] have [facility]. Voice message was left on the phone of [place] [facility] at [facility] requesting a call-back.                 Action/Plan:   Expected Discharge Date:                  Expected Discharge Plan:     In-House Referral:     Discharge planning Services     Post Acute Care Choice:    Choice offered to:     DME Arranged:    DME Agency:     HH Arranged:    HH Agency:     Status of Service:     Medicare Important Message Given:    Date Medicare IM Given:    Medicare IM give by:    Date Additional Medicare IM Given:    Additional Medicare Important Message give by:     If discussed at Long Length of Stay Meetings, dates discussed:    Additional Comments:  Kynley Metzger A, RN 04/16/2016, 12:20 PM

## 2016-04-16 NOTE — Care Management Note (Signed)
Case Management Note  Patient Details  Name: Paula Comptonmmer I Engelson MRN: [id number] Date of Birth: [birth year]  Subjective/Objective:                A call back was received from [place] [facility] at [facility]. They [facility] and a referral was [facility]. [place] can access HH orders in Epic.     Action/Plan:   Expected Discharge Date:                  Expected Discharge Plan:     In-House Referral:     Discharge planning Services     Post Acute Care Choice:    Choice offered to:     DME Arranged:    DME Agency:     HH Arranged:    HH Agency:     Status of Service:     Medicare Important Message Given:    Date Medicare IM Given:    Medicare IM give by:    Date Additional Medicare IM Given:    Additional Medicare Important Message give by:     If discussed at Long Length of Stay Meetings, dates discussed:    Additional Comments:  Emyah Roznowski A, RN 04/16/2016, 12:56 PM

## 2016-04-16 NOTE — Progress Notes (Signed)
[employer] - [place] at [facility]   PATIENT NAME: [name]    MR#:  [id number]  DATE OF BIRTH:  Apr 11, 1926  SUBJECTIVE:   Has a cough with productive sputum today. CXR showing possible pneumonia. Afebrile.  Family at bedside.   REVIEW OF SYSTEMS:    Review of Systems  Constitutional: Negative for fever and chills.  HENT: Negative for congestion and tinnitus.   Eyes: Negative for blurred vision and double vision.  Respiratory: Positive for cough, sputum production and wheezing. Negative for shortness of breath.   Cardiovascular: Negative for chest pain, orthopnea and PND.  Gastrointestinal: Negative for nausea, vomiting, abdominal pain and diarrhea.  Genitourinary: Negative for dysuria and hematuria.  Neurological: Positive for weakness. Negative for dizziness, sensory change and focal weakness.  All other systems reviewed and are negative.   Nutrition: Heart healthy call modified. Tolerating Diet: Yes Tolerating PT: Eval noted.   DRUG ALLERGIES:   Allergies  Allergen Reactions  . Actonel [Risedronate Sodium]   . Risedronate Rash    VITALS:  Blood pressure 115/76, pulse 88, temperature 98.1 F (36.7 C), temperature source Oral, resp. rate 20, height  (1.626 m), weight 72.213 kg (159 lb 3.2 oz), SpO2 96 %.  PHYSICAL EXAMINATION:   Physical Exam  GENERAL:  82 y.o.-year-old patient sitting up in chair in no acute distress.  EYES: Pupils equal, round, reactive to light and accommodation. No scleral icterus. Extraocular muscles intact.  HEENT: Head atraumatic, normocephalic. Oropharynx and nasopharynx clear.  NECK:  Supple, no jugular venous distention. No thyroid enlargement, no tenderness.  LUNGS: Normal breath sounds bilaterally, no wheezing, rales, rhonchi. No use of accessory muscles of respiration.  CARDIOVASCULAR: S1, S2 normal. No murmurs, rubs, or gallops.  ABDOMEN: Soft, nontender, nondistended. Bowel sounds present. No organomegaly  or mass.  EXTREMITIES: No cyanosis, clubbing or edema b/l.    NEUROLOGIC: Cranial nerves II through XII are intact. No focal Motor or sensory deficits b/l.   PSYCHIATRIC: The patient is alert and oriented x 3.  SKIN: No obvious rash, lesion, or ulcer.    LABORATORY PANEL:   CBC  Recent Labs Lab 04/15/16 0611  WBC 6.9  HGB 13.4  HCT 41.7  PLT 142*   ------------------------------------------------------------------------------------------------------------------  Chemistries   Recent Labs Lab 04/13/16 2018  04/14/16 0532  NA 135  --  136  K 4.4  --  3.9  CL 101  --  106  CO2 26  --  24  GLUCOSE 133*  --  156*  BUN 24*  --  21*  CREATININE 1.41*  < > 1.29*  CALCIUM 9.9  --  9.0  AST 24  --   --   ALT 15  --   --   ALKPHOS 79  --   --   BILITOT 0.6  --   --   < > = values in this interval not displayed. ------------------------------------------------------------------------------------------------------------------  Cardiac Enzymes  Recent Labs Lab 04/14/16 1146  TROPONINI 0.04*   ------------------------------------------------------------------------------------------------------------------  RADIOLOGY:  Dg Chest 1 View  04/16/2016  CLINICAL DATA:  82 year old with acute onset of productive cough earlier today. EXAM: Portable CHEST 1 VIEW COMPARISON:  04/13/2016, 03/22/2016 and earlier. FINDINGS: Prior sternotomy for CABG. Cardiac silhouette markedly enlarged, unchanged. Mild pulmonary venous hypertension without overt edema currently. Streaky and patchy airspace opacities at the medial left lung base. Lungs otherwise clear. No visible pleural effusions. IMPRESSION: 1. Atelectasis and/or bronchopneumonia involving the medial left lung base. 2. Stable marked cardiomegaly  and chronic pulmonary venous hypertension without overt edema currently. Electronically Signed   By: Hulan Saashomas  Lawrence M.D.   On: 04/16/2016 13:41     ASSESSMENT AND PLAN:   82 year old female  with past medical history of hypertension, diabetes, hyperlipidemia, osteoporosis, GERD, chronic diastolic CHF, Alzheimer's dementia who presents to the hospital due to weakness nausea and abdominal pain and noted to have a urinary tract infection.  1. Urinary tract infection-cont. Ceftriaxone.  - Urine cultures consistent with contamination.  Afebrile, hemodynamically stable.  -Clinically improved.  2. Pneumonia-patient's chest x-ray this morning showing possible left lower lobe pneumonia. -Patient does have a cough with productive sputum. Continue ceftriaxone will add Zithromax. -Repeat CBC in a.m.  3. Generalized weakness - due to UTI/pneumonia and deconditioning.  - cont. IV abx and appreciate PT eval. Discharge back to assisted living with home health services in the next 1-2 days.  4. DM - cont. SSI and follow BS which are stable.   5. HTN - cont. Lisinopril.    6. ARF - resolved w/ IV fluids and treatment of underlying UTI.   Likely d/c back to assisted Living tomorrow.   All the records are reviewed and case discussed with Care Management/Social Workerr. Management plans discussed with the patient, family and they are in agreement.  CODE STATUS: Full  DVT Prophylaxis: Lovenox  TOTAL TIME TAKING CARE OF THIS PATIENT: 30 minutes.   POSSIBLE D/C IN 1-2 DAYS, DEPENDING ON CLINICAL CONDITION.   Houston SirenSAINANI,Ameia Morency J M.D on 04/16/2016 at 1:52 PM  Between 7am to 6pm - Pager - [phone]  After 6pm go to [email or web address] - password [email or web address] [facility]  [facility]  Office  [phone]  CC: Primary care physician; No primary care provider on file.

## 2016-04-16 NOTE — Clinical Social Work Note (Signed)
Clinical Social Work Assessment  Patient Details  Name: MACKENZIE GROOM MRN: [id number] Date of Birth: 1925/12/21  Date of referral:  04/16/16               Reason for consult:  Facility Placement                Permission sought to share information with:  Family Supports Permission granted to share information::  Yes, Verbal Permission Granted  Name::     daughter and son in law,     Housing/Transportation Living arrangements for the past 2 months:  [place] of Information:  Adult Children Patient Interpreter Needed:  None Criminal Activity/Legal Involvement Pertinent to Current Situation/Hospitalization:  No - Comment as needed Significant Relationships:  Adult Children Lives with:  Facility Resident Do you feel safe going back to the place where you live?  Yes Need for family participation in patient care:  No (Coment)  Care giving concerns:  Pt's needs home health services  Social Worker assessment / plan:  CSW met with pt and family to address consult as she was admitted from [place]. PT is recommending [place] confirmed with facility that pt can safely return to facility. CSW introduced herself and explained role of social work. CSW also explained process of discharging to ALF. PT's family inquired about moving to another [place] building as there are no steps at facility. CSW left a message with admin requesting a return phone call about this. CSW updated family on status. CSW will continue to follow.   Employment status:  Retired Nurse, adult PT Recommendations:  Home with [place] / Referral to community resources:  Other (Comment Required) ([facility])  Patient/Family's Response to care:  Pt's family was appreciative of CSW support.   Patient/Family's Understanding of and Emotional Response to Diagnosis, Current Treatment, and Prognosis:  Pt's family understands that pt is to return to  facility.   Emotional Assessment Appearance:  Appears stated age Attitude/Demeanor/Rapport:  Other (Appropriate) Affect (typically observed):  Appropriate Orientation:  Oriented to Self, Oriented to Place, Oriented to Situation Alcohol / Substance use:  Never Used Psych involvement (Current and /or in the community):  No (Comment)  Discharge Needs  Concerns to be addressed:  Adjustment to Illness Readmission within the last 30 days:  No Current discharge risk:  Chronically ill Barriers to Discharge:  Continued Medical Work up   Darden Dates, LCSW 04/16/2016, 4:34 PM

## 2016-04-17 LAB — GLUCOSE, CAPILLARY
GLUCOSE-CAPILLARY: 144 mg/dL — AB (ref 65–99)
GLUCOSE-CAPILLARY: 203 mg/dL — AB (ref 65–99)
Glucose-Capillary: 132 mg/dL — ABNORMAL HIGH (ref 65–99)
Glucose-Capillary: 236 mg/dL — ABNORMAL HIGH (ref 65–99)

## 2016-04-17 LAB — EXPECTORATED SPUTUM ASSESSMENT W GRAM STAIN, RFLX TO RESP C: Special Requests: NORMAL

## 2016-04-17 LAB — CREATININE, SERUM
Creatinine, Ser: 1.48 mg/dL — ABNORMAL HIGH (ref 0.44–1.00)
GFR, EST AFRICAN AMERICAN: 35 mL/min — AB (ref >60)
GFR, EST NON AFRICAN AMERICAN: 31 mL/min — AB (ref >60)

## 2016-04-17 LAB — EXPECTORATED SPUTUM ASSESSMENT W REFEX TO RESP CULTURE

## 2016-04-17 MED ORDER — ENOXAPARIN SODIUM 30 MG/0.3ML ~~LOC~~ SOLN
30.0000 mg | SUBCUTANEOUS | Status: DC
  Administered 2016-04-17 – 2016-04-18 (×2): 30 mg via SUBCUTANEOUS
  Filled 2016-04-17 (×2): qty 0.3

## 2016-04-17 MED ORDER — PIPERACILLIN-TAZOBACTAM 3.375 G IVPB
3.3750 g | Freq: Three times a day (TID) | INTRAVENOUS | Status: DC
  Filled 2016-04-17 (×3): qty 50

## 2016-04-17 MED ORDER — BUDESONIDE 0.25 MG/2ML IN SUSP
0.2500 mg | Freq: Two times a day (BID) | RESPIRATORY_TRACT | Status: DC
  Administered 2016-04-17 – 2016-04-19 (×4): 0.25 mg via RESPIRATORY_TRACT
  Filled 2016-04-17 (×4): qty 2

## 2016-04-17 MED ORDER — METHYLPREDNISOLONE SODIUM SUCC 40 MG IJ SOLR
40.0000 mg | Freq: Two times a day (BID) | INTRAMUSCULAR | Status: DC
  Administered 2016-04-17 – 2016-04-19 (×5): 40 mg via INTRAVENOUS
  Filled 2016-04-17 (×5): qty 1

## 2016-04-17 MED ORDER — PIPERACILLIN-TAZOBACTAM 3.375 G IVPB
3.3750 g | Freq: Three times a day (TID) | INTRAVENOUS | Status: DC
  Administered 2016-04-17 – 2016-04-18 (×4): 3.375 g via INTRAVENOUS
  Filled 2016-04-17 (×6): qty 50

## 2016-04-17 NOTE — Progress Notes (Signed)
Physical Therapy Treatment Patient Details Name: [name] MRN: [id number] DOB: 1926-04-07 Today's Date: 04/17/2016    History of Present Illness 82 yo female resident of [facility] home was admitted with UTI, CHF and AKI and PMHx:  CHF, DM, CABG.    PT Comments    Pt able to ambulate 80 feet with RW CGA without loss of balance (pt's daughter reports ambulation appeared more difficult for pt today compared to yesterday though and pt was not going to discharge today d/t PNA concerns).  Pt did demonstrate mild antalgic gait d/t L knee pain (appears arthritic pain per pt and family's report).  Pt required rest breaks with LE therapeutic exercise.  Will continue to progress pt with balance, strengthening, ambulation distance, and increasing independence with functional mobility per pt tolerance.   Follow Up Recommendations  Home health PT;Supervision/Assistance - 24 hour     Equipment Recommendations   (pt owns RW)    Recommendations for Other Services       Precautions / Restrictions Precautions Precautions: Fall Restrictions Weight Bearing Restrictions: No    Mobility  Bed Mobility               General bed mobility comments: up in chair when PT arrived  Transfers Overall transfer level: Needs assistance Equipment used: Rolling walker (2 wheeled) Transfers: Sit to/from Stand Sit to Stand: Min guard         General transfer comment: x3 trials; no cueing required for hand placement  Ambulation/Gait Ambulation/Gait assistance: Min guard Ambulation Distance (Feet): 80 Feet Assistive device: Rolling walker (2 wheeled)   Gait velocity: decreased   General Gait Details: mild antalgic; decreased stance time L LE (pt and pt's family reports pt with arthritis issues in L knee); vc's required to stay closer to walker   Stairs            Wheelchair Mobility    Modified Rankin (Stroke Patients Only)       Balance Overall balance  assessment: Needs assistance Sitting-balance support: Feet supported Sitting balance-Leahy Scale: Good     Standing balance support: Bilateral upper extremity supported (on RW) Standing balance-Leahy Scale: Fair                      Cognition Arousal/Alertness: Awake/alert Behavior During Therapy: Impulsive Overall Cognitive Status: History of cognitive impairments - at baseline       Memory: Decreased recall of precautions;Decreased short-term memory              Exercises Total Joint Exercises Ankle Circles/Pumps: AROM;Strengthening;Both;10 reps;Seated Towel Squeeze: AROM;Strengthening;Both;10 reps;Seated (pillow between pt's knees x3 second holds) Long [employer]: AROM;Strengthening;Both;10 reps;Seated Marching in Standing: AROM;Strengthening;Both;10 reps;Standing General Exercises - Lower Extremity Hip ABduction/ADduction: AROM;Strengthening;Both;10 reps;Standing Hip Flexion/Marching: AROM;Strengthening;Both;10 reps;Seated    General Comments General comments (skin integrity, edema, etc.): Pt coughing intermittently during session.  Nursing cleared pt for participation in physical therapy.  Pt agreeable to PT session.  Pt's daughter and daughter's husband present during session.      Pertinent Vitals/Pain Pain Assessment: 0-10 Pain Score: 4  Pain Location: lower back and hips with coughing Pain Descriptors / Indicators: Sore;Tightness Pain Intervention(s): Limited activity within patient's tolerance;Monitored during session;Repositioned    Home Living                      Prior Function            PT Goals (current goals can now  be found in the care plan section) Acute Rehab PT Goals Patient Stated Goal: to get home soon PT Goal Formulation: With patient/family Time For Goal Achievement: 04/29/16 Potential to Achieve Goals: Good Progress towards PT goals: Progressing toward goals    Frequency  Min 2X/week    PT Plan Current plan  remains appropriate    Co-evaluation             End of Session Equipment Utilized During Treatment: Gait belt Activity Tolerance: Patient limited by fatigue Patient left: in chair;with call bell/phone within reach;with family/visitor present (Pt's family adamant that nursing told them pt did not need chair pad alarm on or in chair when they (family) were present (chair pad alarm not in pt's chair upon PT arrival); nursing notified immediately post session and nursing went into room to address chair alarm)     Time: 1452-1516 PT Time Calculation (min) (ACUTE ONLY): 24 min  Charges:  $Gait Training: 8-22 mins $Therapeutic Exercise: 8-22 mins                    G CodesHendricks Limes:      Aziel Morgan 04/17/2016, 3:43 PM Hendricks LimesEmily Peder Allums, PT [phone]

## 2016-04-17 NOTE — Progress Notes (Signed)
Physical therapy approached me re: no chair alarm in pt's chair.  Family stated to her "they were told pt did not need alarm as long as they were present."  Pt scored as high fall risk on assessment this morning.  I went in and spoke with family members at bedside, gentleman at beside very irrate with this RN stated "night shift told daughter she could take her to the bathroom by herself, they were told she did not need a chair alarm unless family was not present in the room".  I tried to explain to family that I was trying to keep patient safe, that we use the chair alarms and bed alarms for pt safety.  Female visitor cussing at this RN "stating I was trying to come in and change everything, nobody is on the same page".

## 2016-04-17 NOTE — NC FL2 (Signed)
[place] MEDICAID FL2 LEVEL OF CARE SCREENING TOOL     IDENTIFICATION  Patient Name: [name] Birthdate: [birth year] Sex: female Admission Date (Current Location): 04/13/2016  [facility] and [place] Number:  [address]  ([id number]) Facility and Address:  [facility], [address] [place], [place] [address]      Provider Number: [id number]  Attending Physician Name and Address:  Houston Siren, MD  Relative Name and Phone Number:       Current Level of Care: Hospital Recommended Level of Care: Assisted Living Facility Prior Approval Number:    Date Approved/Denied:   PASRR Number:  [id number])  Discharge Plan: Domiciliary (Rest home)    Current Diagnoses: Patient Active Problem List   Diagnosis Date Noted  . AKI (acute kidney injury) (HCC) 04/13/2016  . UTI (lower urinary tract infection) 04/13/2016  . Skin lesion on examination 08/06/2015  . Chronic diastolic CHF (congestive heart failure) (HCC) 05/11/2015  . Vitamin B 12 deficiency 05/11/2015  . Type 2 diabetes mellitus without complication (HCC)   . Hypertension   . Hyperlipidemia   . Osteoporosis   . GERD (gastroesophageal reflux disease)   . BBB (bundle branch block)     Orientation RESPIRATION BLADDER Height & Weight     Place, Self  Normal Continent Weight: 155 lb 3.2 oz (70.398 kg) Height:   (162.6 cm)  BEHAVIORAL SYMPTOMS/MOOD NEUROLOGICAL BOWEL NUTRITION STATUS   (None)  (None) Continent Diet (heart healthy/carb )  AMBULATORY STATUS COMMUNICATION OF NEEDS Skin   Limited Assist Verbally Normal                       Personal Care Assistance Level of Assistance  Bathing, Feeding, Dressing Bathing Assistance: Limited assistance Feeding assistance: Independent Dressing Assistance: Limited assistance     Functional Limitations Info  Sight, Hearing, Speech Sight Info: Impaired Hearing Info: Adequate Speech Info: Adequate    SPECIAL CARE FACTORS  FREQUENCY  PT (By licensed PT)     PT Frequency:  (2-3x per week)              Contractures      Additional Factors Info  Code Status, Insulin Sliding Scale, Allergies Code Status Info:  (Full Code) Allergies Info:  (Actonel, Risedronate)   Insulin Sliding Scale Info:  (insulin aspart (novoLOG) injection 0-9 Units 0-9 Units, Subcutaneous, 3 times daily with meals& insulin aspart (novoLOG) injection 0-5 Units 0-5 Units, Subcutaneous, Daily at bedtime )         Discharge Medications: Current Discharge Medication List    START taking these medications   Details  amoxicillin-clavulanate (AUGMENTIN) 875-125 MG tablet Take 1 tablet by mouth 2 (two) times daily. Qty: 14 tablet, Refills: 0    guaiFENesin (MUCINEX) 600 MG 12 hr tablet Take 1 tablet (600 mg total) by mouth 2 (two) times daily.    guaiFENesin-dextromethorphan (ROBITUSSIN DM) 100-10 MG/5ML syrup Take 5 mLs by mouth every 4 (four) hours as needed for cough. Qty: 118 mL, Refills: 0    predniSONE (DELTASONE) 10 MG tablet Label & dispense according to the schedule below. 5 Pills PO for 1 day then, 4 Pills PO for 1 day, 3 Pills PO for 1 day, 2 Pills PO for 1 day, 1 Pill PO for 1 days then STOP. Qty: 15 tablet, Refills: 0      CONTINUE these medications which have NOT CHANGED   Details  acetaminophen (TYLENOL) 500 MG tablet Take 500 mg by mouth  2 (two) times daily.    Calcium Carb-Cholecalciferol (CALCIUM PLUS VITAMIN D3) 600-800 MG-UNIT TABS Take 1 tablet by mouth 2 (two) times daily.    furosemide (LASIX) 20 MG tablet Take 20 mg by mouth 2 (two) times daily.    lisinopril (PRINIVIL,ZESTRIL) 10 MG tablet Take 10 mg by mouth daily.    polyethylene glycol (MIRALAX / GLYCOLAX) packet Take 17 g by mouth daily as needed.     potassium citrate (UROCIT-K) 10 MEQ (1080 MG) SR tablet Take 10 mEq by mouth 2 (two) times daily.              Relevant Imaging  Results:  Relevant Lab Results:   Additional Information  (SSN [id number])  Verta Ellenhristina E Hero Mccathern, LCSW

## 2016-04-17 NOTE — Progress Notes (Signed)
CSW spoke to [facility] [employer] Coordinator for [facility]. Per Liborio NixonJanice [facility] [address] [facility] [address]. Per Liborio NixonJanice if patient discharges today she'll have to return to her original building and it will be coordinated with the family but if patient discharges tomorrow she can discharge to the new facility building. Liborio NixonJanice notes that [facility]. CSW informed RNCM. CSW contacted patient's daughter Talbert ForestShirley [phone] and left a voicemail. Awaiting phone call back. CSW will continue to follow and assist.   Woodroe Modehristina Bernardino Dowell, MSW, LCSW-A Clinical Social Work Department [phone]

## 2016-04-17 NOTE — Progress Notes (Signed)
[employer] - [place] at [facility]   PATIENT NAME: [name]    MR#:  [id number]  DATE OF BIRTH:  09-19-1926  SUBJECTIVE:   Still has a productive cough and now the sputum is blood tinged. Having some wheezing. Family at bedside.   REVIEW OF SYSTEMS:    Review of Systems  Constitutional: Negative for fever and chills.  HENT: Negative for congestion and tinnitus.   Eyes: Negative for blurred vision and double vision.  Respiratory: Positive for cough, sputum production and wheezing. Negative for shortness of breath.   Cardiovascular: Negative for chest pain, orthopnea and PND.  Gastrointestinal: Negative for nausea, vomiting, abdominal pain and diarrhea.  Genitourinary: Negative for dysuria and hematuria.  Neurological: Positive for weakness. Negative for dizziness, sensory change and focal weakness.  All other systems reviewed and are negative.   Nutrition: Heart healthy call modified. Tolerating Diet: Yes Tolerating PT: Eval noted.   DRUG ALLERGIES:   Allergies  Allergen Reactions  . Actonel [Risedronate Sodium]   . Risedronate Rash    VITALS:  Blood pressure 94/59, pulse 100, temperature 98.3 F (36.8 C), temperature source Oral, resp. rate 16, height  (1.626 m), weight 70.398 kg (155 lb 3.2 oz), SpO2 96 %.  PHYSICAL EXAMINATION:   Physical Exam  GENERAL:  82 y.o.-year-old patient sitting up in chair in no acute distress.  EYES: Pupils equal, round, reactive to light and accommodation. No scleral icterus. Extraocular muscles intact.  HEENT: Head atraumatic, normocephalic. Oropharynx and nasopharynx clear.  NECK:  Supple, no jugular venous distention. No thyroid enlargement, no tenderness.  LUNGS: Diffuse wheezing bilaterally. Negative use of accessory muscles. No dullness to percussion. CARDIOVASCULAR: S1, S2 normal. No murmurs, rubs, or gallops.  ABDOMEN: Soft, nontender, nondistended. Bowel sounds present. No organomegaly or mass.   EXTREMITIES: No cyanosis, clubbing or edema b/l.    NEUROLOGIC: Cranial nerves II through XII are intact. No focal Motor or sensory deficits b/l.   PSYCHIATRIC: The patient is alert and oriented x 3.  SKIN: No obvious rash, lesion, or ulcer.    LABORATORY PANEL:   CBC  Recent Labs Lab 04/15/16 0611  WBC 6.9  HGB 13.4  HCT 41.7  PLT 142*   ------------------------------------------------------------------------------------------------------------------  Chemistries   Recent Labs Lab 04/13/16 2018  04/14/16 0532 04/17/16 0443  NA 135  --  136  --   K 4.4  --  3.9  --   CL 101  --  106  --   CO2 26  --  24  --   GLUCOSE 133*  --  156*  --   BUN 24*  --  21*  --   CREATININE 1.41*  < > 1.29* 1.48*  CALCIUM 9.9  --  9.0  --   AST 24  --   --   --   ALT 15  --   --   --   ALKPHOS 79  --   --   --   BILITOT 0.6  --   --   --   < > = values in this interval not displayed. ------------------------------------------------------------------------------------------------------------------  Cardiac Enzymes  Recent Labs Lab 04/14/16 1146  TROPONINI 0.04*   ------------------------------------------------------------------------------------------------------------------  RADIOLOGY:  Dg Chest 1 View  04/16/2016  CLINICAL DATA:  82 year old with acute onset of productive cough earlier today. EXAM: Portable CHEST 1 VIEW COMPARISON:  04/13/2016, 03/22/2016 and earlier. FINDINGS: Prior sternotomy for CABG. Cardiac silhouette markedly enlarged, unchanged. Mild pulmonary venous hypertension without overt edema  currently. Streaky and patchy airspace opacities at the medial left lung base. Lungs otherwise clear. No visible pleural effusions. IMPRESSION: 1. Atelectasis and/or bronchopneumonia involving the medial left lung base. 2. Stable marked cardiomegaly and chronic pulmonary venous hypertension without overt edema currently. Electronically Signed   By: Hulan Saashomas  Lawrence M.D.   On:  04/16/2016 13:41     ASSESSMENT AND PLAN:   82 year old female with past medical history of hypertension, diabetes, hyperlipidemia, osteoporosis, GERD, chronic diastolic CHF, Alzheimer's dementia who presents to the hospital due to weakness nausea and abdominal pain and noted to have a urinary tract infection.  1. Urinary tract infection- was on Ceftriaxone and now changed to Zosyn to cover for HCAP which should treat UTI.  - Urine cultures consistent with contamination.  Afebrile, hemodynamically stable.  -Clinically improved.  2. Pneumonia-patient has significant wheezing and bronchospasm today. -We'll change IV antibiotics from ceftriaxone and Zithromax to Zosyn. Added IV steroids, Pulmicort nebs, continue albuterol nebs. Cont. Anti-tussives. -Check sputum culture. Follow clinically.  3. Generalized weakness - due to UTI/pneumonia and deconditioning.  - cont. IV abx and appreciate PT eval. Discharge back to assisted living with home health services in the next 1-2 days depending on progress.   4. DM - cont. SSI and follow BS which are stable.   5. HTN - cont. Lisinopril.    6. ARF - resolved w/ IV fluids and treatment of underlying UTI.     All the records are reviewed and case discussed with Care Management/Social Workerr. Management plans discussed with the patient, family and they are in agreement.  CODE STATUS: Full  DVT Prophylaxis: Lovenox  TOTAL TIME TAKING CARE OF THIS PATIENT: 30 minutes.   POSSIBLE D/C IN 1-2 DAYS, DEPENDING ON CLINICAL CONDITION.   Houston SirenSAINANI,Jamorris Ndiaye J M.D on 04/17/2016 at 3:32 PM  Between 7am to 6pm - Pager - [phone]  After 6pm go to [email or web address] - password [email or web address] [facility]  [facility]  Office  [phone]  CC: Primary care physician; No primary care provider on file.

## 2016-04-17 NOTE — Progress Notes (Signed)
Anticoagulation Monitoring  Patient is an 82 yo female with orders for Lovenox 40 mg subq q24h for DVT prophylaxis.  Patient's est CrCl~26 mL/min.  Per anticoagulation policy, will transition patient to Lovenox 30 mg subq q24h based on CrCl<30 mL/min.  Pharmacy will continue to follow and monitor per policy.  Clarisa Schoolsrystal Rebeca Valdivia, PharmD Clinical Pharmacist 04/17/2016

## 2016-04-17 NOTE — Consult Note (Signed)
Pharmacy Antibiotic Note  [name] is a 82 y.o. female admitted on 04/13/2016 with pneumonia.  Pharmacy has been consulted for zosyn dosing.  Plan: Zosyn 3.375g IV q8h (4 hour infusion). Continue to monitor renal function for dose adjustment  Height: 5\' 4"  (162.6 cm) Weight: 155 lb 3.2 oz (70.398 kg) IBW/kg (Calculated) : 54.7  Temp (24hrs), Avg:98.8 F (37.1 C), Min:98.1 F (36.7 C), Max:99.9 F (37.7 C)   Recent Labs Lab 04/13/16 2018 04/13/16 2336 04/14/16 0532 04/15/16 0611 04/17/16 0443  WBC 8.2 7.2 5.7 6.9  --   CREATININE 1.41* 1.26* 1.29*  --  1.48*    Estimated Creatinine Clearance: 25.8 mL/min (by C-G formula based on Cr of 1.48).    Allergies  Allergen Reactions  . Actonel [Risedronate Sodium]   . Risedronate Rash    Antimicrobials this admission: ceftriaxone 5/18 >> 5/21 azithromycin 5/21 >> 5/21 Zosyn 5/22>>  Dose adjustments this admission:   Microbiology results:  5/19 UCx: mult spe   5/18 MRSA PCR: neg  Thank you for allowing pharmacy to be a part of this patient's care.  Olene FlossMelissa D Haileigh Pitz 04/17/2016 9:42 AM

## 2016-04-18 LAB — GLUCOSE, CAPILLARY
GLUCOSE-CAPILLARY: 152 mg/dL — AB (ref 65–99)
GLUCOSE-CAPILLARY: 262 mg/dL — AB (ref 65–99)
GLUCOSE-CAPILLARY: 152 mg/dL — AB (ref 65–99)
Glucose-Capillary: 163 mg/dL — ABNORMAL HIGH (ref 65–99)

## 2016-04-18 LAB — BASIC METABOLIC PANEL
Anion gap: 6 (ref 5–15)
BUN: 30 mg/dL — AB (ref 6–20)
CHLORIDE: 99 mmol/L — AB (ref 101–111)
CO2: 26 mmol/L (ref 22–32)
CREATININE: 1.42 mg/dL — AB (ref 0.44–1.00)
Calcium: 8.8 mg/dL — ABNORMAL LOW (ref 8.9–10.3)
GFR calc Af Amer: 37 mL/min — ABNORMAL LOW (ref >60)
GFR calc non Af Amer: 32 mL/min — ABNORMAL LOW (ref >60)
GLUCOSE: 157 mg/dL — AB (ref 65–99)
Potassium: 4.6 mmol/L (ref 3.5–5.1)
SODIUM: 131 mmol/L — AB (ref 135–145)

## 2016-04-18 MED ORDER — SODIUM CHLORIDE 0.9 % IV SOLN
3.0000 g | Freq: Two times a day (BID) | INTRAVENOUS | Status: DC
  Administered 2016-04-18 – 2016-04-19 (×2): 3 g via INTRAVENOUS
  Filled 2016-04-18 (×3): qty 3

## 2016-04-18 NOTE — Progress Notes (Signed)
[facility] - [place] at [facility]   PATIENT NAME: [name]    MR#:  [id number]  DATE OF BIRTH:  04-01-1926  SUBJECTIVE:   Still has some wheezing/bronchospam, but improving. Family at bedside.    REVIEW OF SYSTEMS:    Review of Systems  Constitutional: Negative for fever and chills.  HENT: Negative for congestion and tinnitus.   Eyes: Negative for blurred vision and double vision.  Respiratory: Positive for cough, sputum production and wheezing. Negative for shortness of breath.   Cardiovascular: Negative for chest pain, orthopnea and PND.  Gastrointestinal: Negative for nausea, vomiting, abdominal pain and diarrhea.  Genitourinary: Negative for dysuria and hematuria.  Neurological: Positive for weakness. Negative for dizziness, sensory change and focal weakness.  All other systems reviewed and are negative.   Nutrition: Heart healthy call modified. Tolerating Diet: Yes Tolerating PT: Eval noted.   DRUG ALLERGIES:   Allergies  Allergen Reactions  . Actonel [Risedronate Sodium]   . Risedronate Rash    VITALS:  Blood pressure 113/77, pulse 74, temperature 97.9 F (36.6 C), temperature source Oral, resp. rate 18, height 5\' 4"  (1.626 m), weight 75.796 kg (167 lb 1.6 oz), SpO2 97 %.  PHYSICAL EXAMINATION:   Physical Exam  GENERAL:  82 y.o.-year-old patient sitting up in chair in no acute distress.  EYES: Pupils equal, round, reactive to light and accommodation. No scleral icterus. Extraocular muscles intact.  HEENT: Head atraumatic, normocephalic. Oropharynx and nasopharynx clear.  NECK:  Supple, no jugular venous distention. No thyroid enlargement, no tenderness.  LUNGS: Good a/e b./l.  End-exp. Wheezing/rhonchi on left base. Negative use of accessory muscles. No dullness to percussion. CARDIOVASCULAR: S1, S2 normal. No murmurs, rubs, or gallops.  ABDOMEN: Soft, nontender, nondistended. Bowel sounds present. No organomegaly or mass.   EXTREMITIES: No cyanosis, clubbing or edema b/l.    NEUROLOGIC: Cranial nerves II through XII are intact. No focal Motor or sensory deficits b/l.   PSYCHIATRIC: The patient is alert and oriented x 3.  SKIN: No obvious rash, lesion, or ulcer.    LABORATORY PANEL:   CBC  Recent Labs Lab 04/15/16 0611  WBC 6.9  HGB 13.4  HCT 41.7  PLT 142*   ------------------------------------------------------------------------------------------------------------------  Chemistries   Recent Labs Lab 04/13/16 2018  04/18/16 0501  NA 135  < > 131*  K 4.4  < > 4.6  CL 101  < > 99*  CO2 26  < > 26  GLUCOSE 133*  < > 157*  BUN 24*  < > 30*  CREATININE 1.41*  < > 1.42*  CALCIUM 9.9  < > 8.8*  AST 24  --   --   ALT 15  --   --   ALKPHOS 79  --   --   BILITOT 0.6  --   --   < > = values in this interval not displayed. ------------------------------------------------------------------------------------------------------------------  Cardiac Enzymes  Recent Labs Lab 04/14/16 1146  TROPONINI 0.04*   ------------------------------------------------------------------------------------------------------------------  RADIOLOGY:  No results found.   ASSESSMENT AND PLAN:   82 year old female with past medical history of hypertension, diabetes, hyperlipidemia, osteoporosis, GERD, chronic diastolic CHF, Alzheimer's dementia who presents to the hospital due to weakness nausea and abdominal pain and noted to have a urinary tract infection.  1. Urinary tract infection- was on Ceftriaxone->Zosyn-> now on Unasyn. Which should treat UTI.  - Urine cultures consistent with contamination.  Afebrile, hemodynamically stable.   2. Pneumonia-patient still Has some wheezing and bronchospasm but improved  since yesterday. -IV antibiotics changed to Zosyn yesterday and will now to IV Unasyn today. Continue IV steroids, Pulmicort nebs, albuterol nebs. Cont. Anti-tussives. -Serum culture consistent with  normal flora presently.  3. Generalized weakness - due to UTI/pneumonia and deconditioning.  - cont. IV abx and appreciate PT eval. Discharge back to assisted living with home health services in the next 1-2 days depending on progress.   4. DM - cont. SSI and follow BS which are stable.   5. HTN - cont. Lisinopril.    6. ARF - resolved w/ IV fluids and treatment of underlying UTI.   Likely d/c to assisted Living tomorrow.   All the records are reviewed and case discussed with Care Management/Social Workerr. Management plans discussed with the patient, family and they are in agreement.  CODE STATUS: Full  DVT Prophylaxis: Lovenox  TOTAL TIME TAKING CARE OF THIS PATIENT: 25 minutes.   POSSIBLE D/C IN 1-2 DAYS, DEPENDING ON CLINICAL CONDITION.   Houston Siren M.D on 04/18/2016 at 3:38 PM  Between 7am to 6pm - Pager - [phone]  After 6pm go to [email or web address] - password [email or web address] [email or web address]  [place] [facility]  Office  [phone]  CC: Primary care physician; No primary care provider on file.

## 2016-04-18 NOTE — Care Management (Signed)
Barriers to discharge= [facility] PNA with productive cough, blood tinged sputum and wheezing. IV antibiotics changed, added IV steroids. Back to [facility] in 1-2 days with [facility]

## 2016-04-19 LAB — CBC
HEMATOCRIT: 41.1 % (ref 35.0–47.0)
HEMOGLOBIN: 13.4 g/dL (ref 12.0–16.0)
MCH: 27.1 pg (ref 26.0–34.0)
MCHC: 32.6 g/dL (ref 32.0–36.0)
MCV: 83.1 fL (ref 80.0–100.0)
Platelets: 174 10*3/uL (ref 150–440)
RBC: 4.94 MIL/uL (ref 3.80–5.20)
RDW: 16.1 % — AB (ref 11.5–14.5)
WBC: 10.4 10*3/uL (ref 3.6–11.0)

## 2016-04-19 LAB — GLUCOSE, CAPILLARY
GLUCOSE-CAPILLARY: 224 mg/dL — AB (ref 65–99)
Glucose-Capillary: 183 mg/dL — ABNORMAL HIGH (ref 65–99)
Glucose-Capillary: 202 mg/dL — ABNORMAL HIGH (ref 65–99)

## 2016-04-19 MED ORDER — GUAIFENESIN-DM 100-10 MG/5ML PO SYRP: 5.0000 mL | ORAL_SOLUTION | ORAL | Status: DC | PRN

## 2016-04-19 MED ORDER — DOCUSATE SODIUM 100 MG PO CAPS
200.0000 mg | ORAL_CAPSULE | Freq: Two times a day (BID) | ORAL | Status: DC
  Administered 2016-04-19: 200 mg via ORAL
  Filled 2016-04-19: qty 2

## 2016-04-19 MED ORDER — AMOXICILLIN-POT CLAVULANATE 875-125 MG PO TABS: 1.0000 | ORAL_TABLET | Freq: Two times a day (BID) | ORAL | Status: DC

## 2016-04-19 MED ORDER — GUAIFENESIN ER 600 MG PO TB12: 600.0000 mg | ORAL_TABLET | Freq: Two times a day (BID) | ORAL | Status: AC

## 2016-04-19 MED ORDER — PREDNISONE 10 MG PO TABS: ORAL_TABLET | ORAL | Status: DC

## 2016-04-19 NOTE — Progress Notes (Signed)
Clinical Social Worker was informed that patient will be medically ready to discharge to [facility]. Patient and her family are in a agreement with plan. CSW called Liborio NixonJanice- Admissions Coordinator for [facility] to confirm that patient's bed is ready. All discharge information faxed to [facility] [phone]. Patient will discharge to [facility] via her family.  Woodroe Modehristina Naomee Nowland, MSW, LCSW-A Clinical Social Work Department [phone]

## 2016-04-19 NOTE — Care Management (Addendum)
Discharging back to [facility] today. Notified [facility] of DC and need for PT, SN and HHA.

## 2016-04-19 NOTE — Care Management Important Message (Signed)
Important Message  Patient Details  Name: [name] MRN: [id number] Date of Birth: [birth year]   Medicare Important Message Given:  Yes    Olegario MessierKathy A Margareth Kanner 04/19/2016, 11:46 AM

## 2016-04-19 NOTE — Discharge Summary (Signed)
[facility] - [place] at [facility]   PATIENT NAME: [name]    MR#:  [id number]  DATE OF BIRTH:  [birth year]  DATE OF ADMISSION:  04/13/2016 ADMITTING PHYSICIAN: Oralia Manis, MD  DATE OF DISCHARGE: 04/19/2016  PRIMARY CARE PHYSICIAN: No primary care provider on file.    ADMISSION DIAGNOSIS:  Dehydration [E86.0] UTI (lower urinary tract infection) [N39.0] Acute renal injury (HCC) [N17.9]  DISCHARGE DIAGNOSIS:  Principal Problem:   UTI (lower urinary tract infection) Active Problems:   Type 2 diabetes mellitus without complication (HCC)   Hypertension   Hyperlipidemia   Chronic diastolic CHF (congestive heart failure) (HCC)   AKI (acute kidney injury) (HCC)   SECONDARY DIAGNOSIS:   Past Medical History  Diagnosis Date  . Diabetes mellitus     TYPE 2  . Hypertension   . Hyperlipidemia   . Osteoporosis   . GERD (gastroesophageal reflux disease)   . Chronic diastolic CHF (congestive heart failure) (HCC)   . BBB (bundle branch block)     CHRONIC  LEFT BBB  . CHF (congestive heart failure) (HCC)   . Alzheimer disease   . Diastolic heart failure (HCC)   . CKD (chronic kidney disease), stage III   . Atrial fibrillation (HCC)   . Coronary artery disease   . Hyperlipemia     HOSPITAL COURSE:   82 year old female with past medical history of hypertension, diabetes, hyperlipidemia, osteoporosis, GERD, chronic diastolic CHF, Alzheimer's dementia who presents to the hospital due to weakness nausea and abdominal pain and noted to have a urinary tract infection.  1. Urinary tract infection-The patient was first treated with IV Zosyn, and then narrowed to IV ceftriaxone and not being discharged on oral Augmentin. Her urine culture has been consistent with contamination and she is clinically stable now and therefore being discharged back to her assisted living.  2. Pneumonia-patient developed some wheezing cough and bronchospasm while in the hospital  underwent a chest x-ray which showed a left lower lobe pneumonia. She does have a productive sputum which has improved now with IV antibiotics and steroids. -She was initially given IV ceftriaxone, Zithromax and switched to IV Zosyn then to IV Unasyn and now being discharged on oral Augmentin. Her shortness of breath or wheezing and bronchospasm has improved over the past few days.  -She is being discharged on some Mucinex and some also some Robitussin for her cough.  3. Generalized weakness - due to UTI/pneumonia and deconditioning. -This is significantly improved after treatment for underlying UTI and pneumonia. She was seen by physical therapy that recommended home health services which is being arranged for her prior to discharge.   4. Hyperglycemia-this was secondary to the steroids she was receiving. She was maintained on some sliding scale insulin. This can be further followed as an outpatient.  5. HTN - she will cont. Lisinopril.   6. ARF - this was ATN secondary to UTI and dehydration. She was treated with some IV fluids and her BUN/creatinine is improved and is back to baseline.  DISCHARGE CONDITIONS:   Stable  CONSULTS OBTAINED:  Treatment Team:  Alwyn Pea, MD  DRUG ALLERGIES:   Allergies  Allergen Reactions  . Actonel [Risedronate Sodium]   . Risedronate Rash    DISCHARGE MEDICATIONS:   Current Discharge Medication List    START taking these medications   Details  amoxicillin-clavulanate (AUGMENTIN) 875-125 MG tablet Take 1 tablet by mouth 2 (two) times daily. Qty: 14 tablet, Refills: 0  guaiFENesin (MUCINEX) 600 MG 12 hr tablet Take 1 tablet (600 mg total) by mouth 2 (two) times daily.    guaiFENesin-dextromethorphan (ROBITUSSIN DM) 100-10 MG/5ML syrup Take 5 mLs by mouth every 4 (four) hours as needed for cough. Qty: 118 mL, Refills: 0    predniSONE (DELTASONE) 10 MG tablet Label  & dispense according to the schedule below. 5 Pills PO for 1 day  then, 4 Pills PO for 1 day, 3 Pills PO for 1 day, 2 Pills PO for 1 day, 1 Pill PO for 1 days then STOP. Qty: 15 tablet, Refills: 0      CONTINUE these medications which have NOT CHANGED   Details  acetaminophen (TYLENOL) 500 MG tablet Take 500 mg by mouth 2 (two) times daily.    Calcium Carb-Cholecalciferol (CALCIUM PLUS VITAMIN D3) 600-800 MG-UNIT TABS Take 1 tablet by mouth 2 (two) times daily.    furosemide (LASIX) 20 MG tablet Take 20 mg by mouth 2 (two) times daily.    lisinopril (PRINIVIL,ZESTRIL) 10 MG tablet Take 10 mg by mouth daily.    polyethylene glycol (MIRALAX / GLYCOLAX) packet Take 17 g by mouth daily as needed.     potassium citrate (UROCIT-K) 10 MEQ (1080 MG) SR tablet Take 10 mEq by mouth 2 (two) times daily.          DISCHARGE INSTRUCTIONS:   DIET:  Cardiac diet  DISCHARGE CONDITION:  Stable  ACTIVITY:  Activity as tolerated  OXYGEN:  Home Oxygen: No.   Oxygen Delivery: room air  DISCHARGE LOCATION:  Assisted Living with [facility] health PT, Nursing, Aid   If you experience worsening of your admission symptoms, develop shortness of breath, life threatening emergency, suicidal or homicidal thoughts you must seek medical attention immediately by calling 911 or calling your MD immediately  if symptoms less severe.  You Must read complete instructions/literature along with all the possible adverse reactions/side effects for all the Medicines you take and that have been prescribed to you. Take any new Medicines after you have completely understood and accpet all the possible adverse reactions/side effects.   Please note  You were cared for by a hospitalist during your hospital stay. If you have any questions about your discharge medications or the care you received while you were in the hospital after you are discharged, you can call the unit and asked to speak with the hospitalist on call if the hospitalist that took care of you is not available. Once you are  discharged, your primary care physician will handle any further medical issues. Please note that NO REFILLS for any discharge medications will be authorized once you are discharged, as it is imperative that you return to your primary care physician (or establish a relationship with a primary care physician if you do not have one) for your aftercare needs so that they can reassess your need for medications and monitor your lab values.     Today   Shortness of breath, wheezing improved. Family at bedside.  Overall feels better.   VITAL SIGNS:  Blood pressure 133/90, pulse 123, temperature 97.8 F (36.6 C), temperature source Oral, resp. rate 19, height 5\' 4"  (1.626 m), weight 74.662 kg (164 lb 9.6 oz), SpO2 95 %.  I/O:    Intake/Output Summary (Last 24 hours) at 04/19/16 1149 Last data filed at 04/19/16 0830  Gross per 24 hour  Intake    870 ml  Output    650 ml  Net    220 ml  PHYSICAL EXAMINATION:  GENERAL:  82 y.o.-year-old patient lying in the bed with no acute distress.  EYES: Pupils equal, round, reactive to light and accommodation. No scleral icterus. Extraocular muscles intact.  HEENT: Head atraumatic, normocephalic. Oropharynx and nasopharynx clear.  NECK:  Supple, no jugular venous distention. No thyroid enlargement, no tenderness.  LUNGS: Normal breath sounds bilaterally, minimal end-exp. Wheezing, No rales,rhonchi. No use of accessory muscles of respiration.  CARDIOVASCULAR: S1, S2 normal. No murmurs, rubs, or gallops.  ABDOMEN: Soft, non-tender, non-distended. Bowel sounds present. No organomegaly or mass.  EXTREMITIES: No pedal edema, cyanosis, or clubbing.  NEUROLOGIC: Cranial nerves II through XII are intact. No focal motor or sensory defecits b/l. Generalized Weakness.  PSYCHIATRIC: The patient is alert and oriented x 3. Good affect.  SKIN: No obvious rash, lesion, or ulcer.   DATA REVIEW:   CBC  Recent Labs Lab 04/19/16 0507  WBC 10.4  HGB 13.4  HCT  41.1  PLT 174    Chemistries   Recent Labs Lab 04/13/16 2018  04/18/16 0501  NA 135  < > 131*  K 4.4  < > 4.6  CL 101  < > 99*  CO2 26  < > 26  GLUCOSE 133*  < > 157*  BUN 24*  < > 30*  CREATININE 1.41*  < > 1.42*  CALCIUM 9.9  < > 8.8*  AST 24  --   --   ALT 15  --   --   ALKPHOS 79  --   --   BILITOT 0.6  --   --   < > = values in this interval not displayed.  Cardiac Enzymes  Recent Labs Lab 04/14/16 1146  TROPONINI 0.04*    Microbiology Results  Results for orders placed or performed during the hospital encounter of 04/13/16  Urine culture     Status: Abnormal   Collection Time: 04/13/16  8:18 PM  Result Value Ref Range Status   Specimen Description URINE, RANDOM  Final   Special Requests NONE  Final   Culture MULTIPLE SPECIES PRESENT, SUGGEST RECOLLECTION (A)  Final   Report Status 04/14/2016 FINAL  Final  MRSA PCR Screening     Status: None   Collection Time: 04/14/16 12:12 AM  Result Value Ref Range Status   MRSA by PCR NEGATIVE NEGATIVE Final    Comment:        The GeneXpert MRSA Assay (FDA approved for NASAL specimens only), is one component of a comprehensive MRSA colonization surveillance program. It is not intended to diagnose MRSA infection nor to guide or monitor treatment for MRSA infections.   Culture, expectorated sputum-assessment     Status: None   Collection Time: 04/17/16 12:10 PM  Result Value Ref Range Status   Specimen Description EXPECTORATED SPUTUM  Final   Special Requests Normal  Final   Sputum evaluation THIS SPECIMEN IS ACCEPTABLE FOR SPUTUM CULTURE  Final   Report Status 04/17/2016 FINAL  Final  Culture, respiratory (NON-Expectorated)     Status: None (Preliminary result)   Collection Time: 04/17/16 12:10 PM  Result Value Ref Range Status   Specimen Description EXPECTORATED SPUTUM  Final   Special Requests Normal Reflexed from [id number]  Final   Gram Stain MODERATE WBC SEEN FAIR SPECIMEN - 70-80% WBCS   Final   Culture  Consistent with normal respiratory flora.  Final   Report Status PENDING  Incomplete    RADIOLOGY:  No results found.    Management plans discussed with the  patient, family and they are in agreement.  CODE STATUS:     Code Status Orders        Start     Ordered   04/13/16 2332  Full code   Continuous     04/13/16 2331    Code Status History    Date Active Date Inactive Code Status Order ID Comments User Context   This patient has a current code status but no historical code status.    Advance Directive Documentation        Most Recent Value   Type of Advance Directive  Healthcare Power of [facility] [place], [place]]   Pre-existing out of facility DNR order (yellow form or pink MOST form)     "MOST" Form in Place?        TOTAL TIME TAKING CARE OF THIS PATIENT: 40 minutes.    Houston Siren M.D on 04/19/2016 at 11:49 AM  Between 7am to 6pm - Pager - [phone]  After 6pm go to [email or web address] - password [email or web address] [facility]  [place] [facility]  Office  [phone]  CC: Primary care physician; No primary care provider on file.

## 2016-04-19 NOTE — Progress Notes (Signed)
Patient going back to [facility] - packet prepared by CSW, instructed not to call report. Patient given discharge teaching and paperwork regarding medications, diet, follow-up appointments and activity. Patient understanding verbalized. No complaints at this time. IV discontinued prior to leaving. Skin assessment as previously charted and vitals are stable; on room air??. Patient being discharged to home. Caregiver/family present during discharge teaching. No further needs by Care Management/CSW. Prescriptions placed in packet for family to give to facility.

## 2016-04-22 LAB — CULTURE, RESPIRATORY: SPECIAL REQUESTS: NORMAL

## 2016-04-22 LAB — CULTURE, RESPIRATORY W GRAM STAIN

## 2016-04-25 ENCOUNTER — Telehealth: Payer: Self-pay | Admitting: Family Medicine

## 2016-04-25 ENCOUNTER — Encounter: Payer: Self-pay | Admitting: Intensive Care

## 2016-04-25 ENCOUNTER — Inpatient Hospital Stay
Admission: EM | Admit: 2016-04-25 | Discharge: 2016-04-27 | DRG: 291 | Payer: Medicare Other | Attending: Internal Medicine | Admitting: Internal Medicine

## 2016-04-25 ENCOUNTER — Emergency Department: Payer: Medicare Other

## 2016-04-25 DIAGNOSIS — N183 Chronic kidney disease, stage 3 (moderate): Secondary | ICD-10-CM | POA: Diagnosis present

## 2016-04-25 DIAGNOSIS — I2510 Atherosclerotic heart disease of native coronary artery without angina pectoris: Secondary | ICD-10-CM | POA: Diagnosis present

## 2016-04-25 DIAGNOSIS — E785 Hyperlipidemia, unspecified: Secondary | ICD-10-CM | POA: Diagnosis present

## 2016-04-25 DIAGNOSIS — I509 Heart failure, unspecified: Secondary | ICD-10-CM

## 2016-04-25 DIAGNOSIS — Z9049 Acquired absence of other specified parts of digestive tract: Secondary | ICD-10-CM | POA: Diagnosis not present

## 2016-04-25 DIAGNOSIS — Z90710 Acquired absence of both cervix and uterus: Secondary | ICD-10-CM | POA: Diagnosis not present

## 2016-04-25 DIAGNOSIS — I5033 Acute on chronic diastolic (congestive) heart failure: Secondary | ICD-10-CM | POA: Diagnosis present

## 2016-04-25 DIAGNOSIS — E1122 Type 2 diabetes mellitus with diabetic chronic kidney disease: Secondary | ICD-10-CM | POA: Diagnosis present

## 2016-04-25 DIAGNOSIS — Z79899 Other long term (current) drug therapy: Secondary | ICD-10-CM

## 2016-04-25 DIAGNOSIS — G309 Alzheimer's disease, unspecified: Secondary | ICD-10-CM | POA: Diagnosis present

## 2016-04-25 DIAGNOSIS — Z8249 Family history of ischemic heart disease and other diseases of the circulatory system: Secondary | ICD-10-CM

## 2016-04-25 DIAGNOSIS — I447 Left bundle-branch block, unspecified: Secondary | ICD-10-CM | POA: Diagnosis present

## 2016-04-25 DIAGNOSIS — Z888 Allergy status to other drugs, medicaments and biological substances status: Secondary | ICD-10-CM | POA: Diagnosis not present

## 2016-04-25 DIAGNOSIS — K219 Gastro-esophageal reflux disease without esophagitis: Secondary | ICD-10-CM | POA: Diagnosis present

## 2016-04-25 DIAGNOSIS — I480 Paroxysmal atrial fibrillation: Secondary | ICD-10-CM | POA: Diagnosis present

## 2016-04-25 DIAGNOSIS — M810 Age-related osteoporosis without current pathological fracture: Secondary | ICD-10-CM | POA: Diagnosis present

## 2016-04-25 DIAGNOSIS — F0280 Dementia in other diseases classified elsewhere without behavioral disturbance: Secondary | ICD-10-CM | POA: Diagnosis present

## 2016-04-25 DIAGNOSIS — Z951 Presence of aortocoronary bypass graft: Secondary | ICD-10-CM | POA: Diagnosis not present

## 2016-04-25 DIAGNOSIS — I130 Hypertensive heart and chronic kidney disease with heart failure and stage 1 through stage 4 chronic kidney disease, or unspecified chronic kidney disease: Principal | ICD-10-CM | POA: Diagnosis present

## 2016-04-25 DIAGNOSIS — Z87891 Personal history of nicotine dependence: Secondary | ICD-10-CM

## 2016-04-25 DIAGNOSIS — Z9889 Other specified postprocedural states: Secondary | ICD-10-CM | POA: Diagnosis not present

## 2016-04-25 LAB — CBC WITH DIFFERENTIAL/PLATELET
BASOS ABS: 0 10*3/uL (ref 0–0.1)
Basophils Relative: 0 %
EOS ABS: 0.2 10*3/uL (ref 0–0.7)
HCT: 42.4 % (ref 35.0–47.0)
Hemoglobin: 13.8 g/dL (ref 12.0–16.0)
LYMPHS ABS: 0.8 10*3/uL — AB (ref 1.0–3.6)
MCH: 26.1 pg (ref 26.0–34.0)
MCHC: 32.6 g/dL (ref 32.0–36.0)
MCV: 79.9 fL — AB (ref 80.0–100.0)
MONO ABS: 1.2 10*3/uL — AB (ref 0.2–0.9)
Monocytes Relative: 10 %
Neutro Abs: 9.9 10*3/uL — ABNORMAL HIGH (ref 1.4–6.5)
Neutrophils Relative %: 81 %
PLATELETS: 204 10*3/uL (ref 150–440)
RBC: 5.31 MIL/uL — ABNORMAL HIGH (ref 3.80–5.20)
RDW: 16.2 % — AB (ref 11.5–14.5)
WBC: 12.2 10*3/uL — AB (ref 3.6–11.0)

## 2016-04-25 LAB — COMPREHENSIVE METABOLIC PANEL
ALT: 31 U/L (ref 14–54)
AST: 37 U/L (ref 15–41)
Albumin: 3.6 g/dL (ref 3.5–5.0)
Alkaline Phosphatase: 111 U/L (ref 38–126)
Anion gap: 7 (ref 5–15)
BUN: 29 mg/dL — ABNORMAL HIGH (ref 6–20)
CHLORIDE: 98 mmol/L — AB (ref 101–111)
CO2: 28 mmol/L (ref 22–32)
Calcium: 8.9 mg/dL (ref 8.9–10.3)
Creatinine, Ser: 1.43 mg/dL — ABNORMAL HIGH (ref 0.44–1.00)
GFR, EST AFRICAN AMERICAN: 37 mL/min — AB (ref >60)
GFR, EST NON AFRICAN AMERICAN: 32 mL/min — AB (ref >60)
Glucose, Bld: 161 mg/dL — ABNORMAL HIGH (ref 65–99)
POTASSIUM: 4.9 mmol/L (ref 3.5–5.1)
SODIUM: 133 mmol/L — AB (ref 135–145)
Total Bilirubin: 1 mg/dL (ref 0.3–1.2)
Total Protein: 6.6 g/dL (ref 6.5–8.1)

## 2016-04-25 LAB — TROPONIN I
TROPONIN I: 0.08 ng/mL — AB (ref <0.031)
Troponin I: 0.08 ng/mL — ABNORMAL HIGH (ref <0.031)
Troponin I: 0.07 ng/mL — ABNORMAL HIGH (ref <0.031)

## 2016-04-25 LAB — BRAIN NATRIURETIC PEPTIDE: B NATRIURETIC PEPTIDE 5: 773 pg/mL — AB (ref 0.0–100.0)

## 2016-04-25 MED ORDER — SENNOSIDES-DOCUSATE SODIUM 8.6-50 MG PO TABS: 1.0000 | ORAL_TABLET | Freq: Every evening | ORAL | Status: DC | PRN

## 2016-04-25 MED ORDER — ENOXAPARIN SODIUM 40 MG/0.4ML ~~LOC~~ SOLN
40.0000 mg | SUBCUTANEOUS | Status: DC
  Administered 2016-04-25: 40 mg via SUBCUTANEOUS

## 2016-04-25 MED ORDER — ACETAMINOPHEN 500 MG PO TABS
500.0000 mg | ORAL_TABLET | Freq: Two times a day (BID) | ORAL | Status: DC
Start: 2016-04-25 — End: 2016-04-27
  Administered 2016-04-25 – 2016-04-27 (×4): 500 mg via ORAL
  Filled 2016-04-25 (×4): qty 1

## 2016-04-25 MED ORDER — ONDANSETRON HCL 4 MG PO TABS: 4.0000 mg | ORAL_TABLET | Freq: Four times a day (QID) | ORAL | Status: DC | PRN

## 2016-04-25 MED ORDER — GUAIFENESIN-DM 100-10 MG/5ML PO SYRP: 5.0000 mL | ORAL_SOLUTION | ORAL | Status: DC | PRN

## 2016-04-25 MED ORDER — DILTIAZEM HCL 25 MG/5ML IV SOLN
10.0000 mg | Freq: Once | INTRAVENOUS | Status: AC
  Administered 2016-04-25: 10 mg via INTRAVENOUS
  Filled 2016-04-25: qty 5

## 2016-04-25 MED ORDER — ENOXAPARIN SODIUM 40 MG/0.4ML ~~LOC~~ SOLN: 40.0000 mg | SUBCUTANEOUS | Status: DC

## 2016-04-25 MED ORDER — ACETAMINOPHEN 325 MG PO TABS
650.0000 mg | ORAL_TABLET | Freq: Four times a day (QID) | ORAL | Status: DC | PRN
Start: 2016-04-25 — End: 2016-04-27

## 2016-04-25 MED ORDER — ACETAMINOPHEN 325 MG PO TABS: 650.0000 mg | ORAL_TABLET | ORAL | Status: DC | PRN

## 2016-04-25 MED ORDER — SODIUM CHLORIDE 0.9 % IV SOLN: 250.0000 mL | INTRAVENOUS | Status: DC | PRN

## 2016-04-25 MED ORDER — SODIUM CHLORIDE 0.9% FLUSH
3.0000 mL | Freq: Two times a day (BID) | INTRAVENOUS | Status: DC
  Administered 2016-04-25 – 2016-04-27 (×5): 3 mL via INTRAVENOUS

## 2016-04-25 MED ORDER — SODIUM CHLORIDE 0.9% FLUSH: 3.0000 mL | INTRAVENOUS | Status: DC | PRN

## 2016-04-25 MED ORDER — ENOXAPARIN SODIUM 40 MG/0.4ML ~~LOC~~ SOLN
SUBCUTANEOUS | Status: AC
  Administered 2016-04-25: 40 mg via SUBCUTANEOUS
  Filled 2016-04-25: qty 0.4

## 2016-04-25 MED ORDER — FUROSEMIDE 10 MG/ML IJ SOLN
20.0000 mg | Freq: Once | INTRAMUSCULAR | Status: AC
  Administered 2016-04-25: 20 mg via INTRAVENOUS
  Filled 2016-04-25: qty 4

## 2016-04-25 MED ORDER — ASPIRIN 325 MG PO TABS
ORAL_TABLET | ORAL | Status: AC
  Administered 2016-04-25: 325 mg via ORAL
  Filled 2016-04-25: qty 1

## 2016-04-25 MED ORDER — ACETAMINOPHEN 650 MG RE SUPP: 650.0000 mg | Freq: Four times a day (QID) | RECTAL | Status: DC | PRN

## 2016-04-25 MED ORDER — ASPIRIN 325 MG PO TABS
325.0000 mg | ORAL_TABLET | Freq: Once | ORAL | Status: AC
  Administered 2016-04-25: 325 mg via ORAL

## 2016-04-25 MED ORDER — ONDANSETRON HCL 4 MG/2ML IJ SOLN: 4.0000 mg | Freq: Four times a day (QID) | INTRAMUSCULAR | Status: DC | PRN

## 2016-04-25 MED ORDER — CALCIUM CARBONATE-VITAMIN D 500-200 MG-UNIT PO TABS
1.0000 | ORAL_TABLET | Freq: Two times a day (BID) | ORAL | Status: DC
  Administered 2016-04-25 – 2016-04-27 (×5): 1 via ORAL
  Filled 2016-04-25 (×5): qty 1

## 2016-04-25 MED ORDER — ENOXAPARIN SODIUM 30 MG/0.3ML ~~LOC~~ SOLN
30.0000 mg | SUBCUTANEOUS | Status: DC
  Administered 2016-04-26: 30 mg via SUBCUTANEOUS
  Filled 2016-04-25: qty 0.3

## 2016-04-25 MED ORDER — POLYETHYLENE GLYCOL 3350 17 G PO PACK: 17.0000 g | PACK | Freq: Every day | ORAL | Status: DC | PRN

## 2016-04-25 MED ORDER — FUROSEMIDE 10 MG/ML IJ SOLN
40.0000 mg | Freq: Two times a day (BID) | INTRAMUSCULAR | Status: DC
  Administered 2016-04-25: 40 mg via INTRAVENOUS
  Filled 2016-04-25 (×2): qty 4

## 2016-04-25 NOTE — Care Management (Signed)
[facility] He is followed by [facility] for SN and PT. [facility]CP is [facility]. CSW updated. Notified [facility] of DC.

## 2016-04-25 NOTE — ED Notes (Signed)
Patient arrived by EMS from [facility]. Family reports patient was here just last week for fluid build up on her lungs and pneumonia. When discharged they were told if she gained more than 4 pounds to see a doctor. The family said when weighed at the facility this morning she had gained 7lbs, so they called EMS to bring her back to be checked out. Patient is A&O X4 and ambulated with assistance from EMS stretcher to ER bed. HX A-fib. EMS V/S [facility] with exceptions to A-fib

## 2016-04-25 NOTE — H&P (Signed)
[facility] - [place] [facility]   PATIENT NAME: [name]    MR#:  [id number]  DATE OF BIRTH:  07-03-1926  DATE OF ADMISSION:  04/25/2016  PRIMARY CARE PHYSICIAN: [facility]   REQUESTING/REFERRING PHYSICIAN: Dr Silverio Lay  CHIEF COMPLAINT:  increasnig sob,Weight gain about 7-9 pounds and leg edema  HISTORY OF PRESENT ILLNESS:  [name]  is a 82 y.o. female with a known history of Chronic diastolic congestive heart failure, GERD, hypertension, hyperlipidemia, Alzheimer's disease Ross in from [facility] with increasing shortness of breath and weight gain of about 7-9 pounds and leg edema. Patient was recently admitted for pneumonia and UTI. No fever or chest pain.  Patient was found to have elevated BNP and chest x-ray suggestive of vascular congestion. She is being admitted with acute on chronic diastolic congestive heart failure.  PAST MEDICAL HISTORY:   Past Medical History  Diagnosis Date  . Diabetes mellitus     TYPE 2  . Hypertension   . Hyperlipidemia   . Osteoporosis   . GERD (gastroesophageal reflux disease)   . Chronic diastolic CHF (congestive heart failure) (HCC)   . BBB (bundle branch block)     CHRONIC  LEFT BBB  . CHF (congestive heart failure) (HCC)   . Alzheimer disease   . Diastolic heart failure (HCC)   . CKD (chronic kidney disease), stage III   . Atrial fibrillation (HCC)   . Coronary artery disease   . Hyperlipemia     PAST SURGICAL HISTOIRY:   Past Surgical History  Procedure Laterality Date  . Cholecystectomy    . Abdominal hysterectomy    . Colonoscopy w/ polypectomy  7/2OO8       05/2010    NEOPLASTIC  POLYPS  . Cabg x 5  08/10/2011    DR BARTLE  . Coronary artery bypass graft  2012    SOCIAL HISTORY:   Social History  Substance Use Topics  . Smoking status: Former Games developer  . Smokeless tobacco: Former Neurosurgeon    Quit date: 11/27/1980  . Alcohol Use: No    FAMILY HISTORY:    Family History  Problem Relation Age of Onset  . Hypertension Mother   . Heart disease Father     DRUG ALLERGIES:   Allergies  Allergen Reactions  . Actonel [Risedronate Sodium]   . Risedronate Rash    REVIEW OF SYSTEMS:  Review of Systems  Constitutional: Negative for fever, chills and weight loss.  HENT: Negative for ear discharge, ear pain and nosebleeds.   Eyes: Negative for blurred vision, pain and discharge.  Respiratory: Positive for shortness of breath. Negative for sputum production, wheezing and stridor.   Cardiovascular: Positive for leg swelling. Negative for chest pain, palpitations, orthopnea and PND.  Gastrointestinal: Negative for nausea, vomiting, abdominal pain and diarrhea.  Genitourinary: Negative for urgency and frequency.  Musculoskeletal: Negative for back pain and joint pain.  Neurological: Positive for weakness. Negative for sensory change, speech change and focal weakness.  Psychiatric/Behavioral: Negative for depression and hallucinations. The patient is not nervous/anxious.   All other systems reviewed and are negative.    MEDICATIONS AT HOME:   Prior to Admission medications   Medication Sig Start Date End Date Taking? Authorizing Provider  acetaminophen (TYLENOL) 500 MG tablet Take 500 mg by mouth 2 (two) times daily.   Yes Historical Provider, MD  amoxicillin-clavulanate (AUGMENTIN) 875-125 MG tablet Take 1 tablet by mouth 2 (two) times daily. 04/19/16  Yes  Houston SirenVivek J Sainani, MD  Calcium Carb-Cholecalciferol (CALCIUM PLUS VITAMIN D3) 600-800 MG-UNIT TABS Take 1 tablet by mouth 2 (two) times daily.   Yes Historical Provider, MD  furosemide (LASIX) 20 MG tablet Take 20 mg by mouth 2 (two) times daily.   Yes Historical Provider, MD  guaiFENesin-dextromethorphan (ROBITUSSIN DM) 100-10 MG/5ML syrup Take 5 mLs by mouth every 4 (four) hours as needed for cough. 04/19/16  Yes Houston SirenVivek J Sainani, MD  lisinopril (PRINIVIL,ZESTRIL) 10 MG tablet Take 10 mg by  mouth daily.   Yes Historical Provider, MD  polyethylene glycol (MIRALAX / GLYCOLAX) packet Take 17 g by mouth daily as needed.    Yes Historical Provider, MD  potassium citrate (UROCIT-K) 10 MEQ (1080 MG) SR tablet Take 10 mEq by mouth 2 (two) times daily.    Yes Historical Provider, MD  predniSONE (DELTASONE) 10 MG tablet Label  & dispense according to the schedule below. 5 Pills PO for 1 day then, 4 Pills PO for 1 day, 3 Pills PO for 1 day, 2 Pills PO for 1 day, 1 Pill PO for 1 days then STOP. 04/19/16  Yes Houston SirenVivek J Sainani, MD      VITAL SIGNS:  Blood pressure 104/70, pulse 107, temperature 98 F (36.7 C), temperature source Oral, resp. rate 19, weight 81.647 kg (180 lb), SpO2 100 %.  PHYSICAL EXAMINATION:  GENERAL:  82 y.o.-year-old patient lying in the bed with no acute distress.  EYES: Pupils equal, round, reactive to light and accommodation. No scleral icterus. Extraocular muscles intact.  HEENT: Head atraumatic, normocephalic. Oropharynx and nasopharynx clear.  NECK:  Supple, no jugular venous distention. No thyroid enlargement, no tenderness.  LUNGS: distant breath sounds bilaterally, no wheezing,few rales,no rhonchi or crepitation. No use of accessory muscles of respiration.  CARDIOVASCULAR: S1, S2 normal. No murmurs, rubs, or gallops.  ABDOMEN: Soft, nontender, nondistended. Bowel sounds present. No organomegaly or mass.  EXTREMITIES: ++ pedal edema, cyanosis, or clubbing.  NEUROLOGIC: Cranial nerves II through XII are intact. Muscle strength 5/5 in all extremities. Sensation intact. Gait not checked.  PSYCHIATRIC: The patient is alert and oriented x 3.  SKIN: No obvious rash, lesion, or ulcer.   LABORATORY PANEL:   CBC  Recent Labs Lab 04/25/16 1007  WBC 12.2*  HGB 13.8  HCT 42.4  PLT 204   ------------------------------------------------------------------------------------------------------------------  Chemistries   Recent Labs Lab 04/25/16 1007  NA 133*  K  4.9  CL 98*  CO2 28  GLUCOSE 161*  BUN 29*  CREATININE 1.43*  CALCIUM 8.9  AST 37  ALT 31  ALKPHOS 111  BILITOT 1.0   ------------------------------------------------------------------------------------------------------------------  Cardiac Enzymes  Recent Labs Lab 04/25/16 1007  TROPONINI 0.08*   ------------------------------------------------------------------------------------------------------------------  RADIOLOGY:  Dg Chest 2 View  04/25/2016  CLINICAL DATA:  Shortness of breath and chest pain for 1 week EXAM: CHEST  2 VIEW COMPARISON:  04/16/2016 FINDINGS: Cardiac shadow remains enlarged. Postsurgical changes are again noted. Diffuse interstitial changes are again seen. No focal infiltrate or sizable effusion is noted. No acute bony abnormality is seen. IMPRESSION: No active cardiopulmonary disease. Electronically Signed   By: Alcide CleverMark  Lukens M.D.   On: 04/25/2016 09:58    EKG:  s tachycardia  IMPRESSION AND PLAN:   Georgina Quintmmer Fly  is a 82 y.o. female with a known history of Chronic diastolic congestive heart failure, GERD, hypertension, hyperlipidemia, Alzheimer's disease Ross in from [facility] with increasing shortness of breath and weight gain of about 7-9 pounds and leg  edema.  1. Acute on chronic congestive heart failure diastolic -Patient has had previous echo showed EF of 50-55% -IV Lasix 40 mg twice a day monitor I's and O's monitor creatinine -Cardiology consultation -We will hold off on lisinopril for now once creatinine remained stable resume it  2. Chronic kidney disease stage III Baseline creatinine around 1.3 Avoid nephrotoxins  3. Generalized weakness. PT to see  4. Alzheimer's dementia Patient is from [facility]   5. DVT prophylaxis subcutaneous Lovenox  All the records are reviewed and case discussed with ED provider. Management plans discussed with the patient, family and they are in agreement.  CODE  STATUS: full (d/w family)  TOTAL TIME TAKING CARE OF THIS PATIENT: 50 minutes.    Aylee Littrell M.D on 04/25/2016 at 12:49 PM  Between 7am to 6pm - Pager - [phone]  After 6pm go to [email or web address] - password [email or web address] [facility]  [facility]  Office  [phone]  CC: Primary care physician; [facility]

## 2016-04-25 NOTE — Evaluation (Signed)
Physical Therapy Evaluation Patient Details Name: [name] MRN: [id number] DOB: 08-Feb-1926 Today's Date: 04/25/2016   History of Present Illness  [name] is a 82 y.o. female with a known history of Chronic diastolic congestive heart failure, GERD, hypertension, hyperlipidemia, Alzheimer's disease comes in from [facility] with increasing shortness of breath and weight gain of about 7-9 pounds and leg edema. Daughter reports she feels her mother is walking slower than typical.   Clinical Impression  Pt presents as pleasant 82 y/o F suffering from CGF exaccerbation with family supplemented history reporting gradual decreased in endurance, increased SOB, decreased walking speed and LE edema. Pt demonstrates good UE strength, and general LE weakness. Pt ambulates with RW at baseline. Pt has good balance with RW, but needs min cues to keep the walker at a safe distance and min cues for safety during turns. Pt unsupported standing balance is fair. Pt would benefit from continued skilled PT services to address strength, gait, balance and safety to maximize function. When discussing POC with pts family, they report the facility the pt just moved to does not have near the activities for residents or anywhere for patients to walk compared to the prior facility. They reported they were wanting to move back to the previous facility. They also stated the current facility is not handicap accessible.     Follow Up Recommendations Supervision/Assistance - 24 hour;Home health PT    Equipment Recommendations   (has walker at home)    Recommendations for Other Services       Precautions / Restrictions Precautions Precautions: Fall      Mobility  Bed Mobility               General bed mobility comments: sitting EOB with family when PT arrived   Transfers Overall transfer level: Needs assistance Equipment used: Rolling walker (2 wheeled)   Sit to Stand: Min guard         General transfer comment: min cues for hand placement on RW during transfer   Ambulation/Gait Ambulation/Gait assistance: Min guard Ambulation Distance (Feet): 80 Feet Assistive device: Rolling walker (2 wheeled)   Gait velocity: decreased Gait velocity interpretation: <1.8 ft/sec, indicative of risk for recurrent falls General Gait Details: mild antalgic; decreased stance time L LE ; vc's required to stay closer to walker on straight away and during turns.   Stairs            Wheelchair Mobility    Modified Rankin (Stroke Patients Only)       Balance Overall balance assessment: Modified Independent Sitting-balance support: No upper extremity supported Sitting balance-Leahy Scale: Good       Standing balance-Leahy Scale: Fair                               Pertinent Vitals/Pain Pain Assessment: No/denies pain    Home Living Family/patient expects to be discharged to:: Assisted living               Home Equipment: Walker - 2 wheels;Grab bars - toilet;Shower seat Additional Comments: per daughter report- the assisted living place where she currently resides has 3 steps to enter.     Prior Function Level of Independence: Independent with assistive device(s)         Comments: has help for housekeeping and meals at ALF     Hand Dominance        Extremity/Trunk Assessment  Upper Extremity Assessment: Overall WFL for tasks assessed           Lower Extremity Assessment: Generalized weakness      Cervical / Trunk Assessment: Kyphotic  Communication   Communication: No difficulties  Cognition Arousal/Alertness: Awake/alert Behavior During Therapy: WFL for tasks assessed/performed Overall Cognitive Status: History of cognitive impairments - at baseline       Memory: Decreased short-term memory              General Comments   Therex:   Exercises General Exercises - Lower Extremity Ankle Circles/Pumps: AROM;Both;20  reps Long Arc Quad: AROM;Both;10 reps Hip Flexion/Marching: AROM;Both;20 reps Pt requires min verbal and tactile cues for proper exercise performance       Assessment/Plan    PT Assessment Patient needs continued PT services  PT Diagnosis Difficulty walking   PT Problem List Decreased strength;Decreased activity tolerance;Decreased mobility;Decreased balance;Decreased safety awareness;Decreased knowledge of use of DME  PT Treatment Interventions Gait training;Stair training;Therapeutic exercise;Patient/family education;Balance training;Neuromuscular re-education   PT Goals (Current goals can be found in the Care Plan section) Acute Rehab PT Goals Patient Stated Goal: get around better and walk longer distance  PT Goal Formulation: With patient/family Time For Goal Achievement: 05/09/16 Potential to Achieve Goals: Good    Frequency Min 2X/week   Barriers to discharge   3 steps to enter Assisted living facility per family    Co-evaluation               End of Session Equipment Utilized During Treatment: Gait belt (RW) Activity Tolerance: Patient limited by fatigue Patient left: in chair;with family/visitor present;with call bell/phone within reach;with chair alarm set           Time: 1635-1700 PT Time Calculation (min) (ACUTE ONLY): 25 min   Charges:   PT Evaluation $PT Eval Low Complexity: 1 Procedure PT Treatments $Therapeutic Exercise: 8-22 mins   PT G Codes:       Saquan Furtick C. Marcquis Ridlon, PT, DPT [phone]  Anavey Coombes 04/25/2016, 5:11 PM

## 2016-04-25 NOTE — ED Provider Notes (Signed)
CSN: [id number]     Arrival date & time 04/25/16  0865 History   First MD Initiated Contact with Patient 04/25/16 [phone]     No chief complaint on file.    (Consider location/radiation/quality/duration/timing/severity/associated sxs/prior Treatment) The history is provided by the patient.  [name] is a 82 y.o. female hx of DM, HTN, diastolic CHF, LBBB, afib, here with Shortness of breath, weight gain. Patient currently resides in the nursing home and was recently minute for pneumonia and is currently still on Augmentin. Patient was discharged about week ago and has gained about 7 pounds since then. Feels short of breath but is chronically short of breath. She should also has worsening leg swelling as well. Has some dementia so does not give much history,.   Level V caveat- dementia    Past Medical History  Diagnosis Date  . Diabetes mellitus     TYPE 2  . Hypertension   . Hyperlipidemia   . Osteoporosis   . GERD (gastroesophageal reflux disease)   . Chronic diastolic CHF (congestive heart failure) (HCC)   . BBB (bundle branch block)     CHRONIC  LEFT BBB  . CHF (congestive heart failure) (HCC)   . Alzheimer disease   . Diastolic heart failure (HCC)   . CKD (chronic kidney disease), stage III   . Atrial fibrillation (HCC)   . Coronary artery disease   . Hyperlipemia    Past Surgical History  Procedure Laterality Date  . Cholecystectomy    . Abdominal hysterectomy    . Colonoscopy w/ polypectomy  7/2OO8       05/2010    NEOPLASTIC  POLYPS  . Cabg x 5  08/10/2011    DR BARTLE  . Coronary artery bypass graft  2012   Family History  Problem Relation Age of Onset  . Hypertension Mother   . Heart disease Father    Social History  Substance Use Topics  . Smoking status: Former Games developer  . Smokeless tobacco: Former Neurosurgeon    Quit date: 11/27/1980  . Alcohol Use: No   OB History    No data available     Review of Systems  Respiratory: Positive for shortness of breath.    Cardiovascular: Positive for leg swelling.  All other systems reviewed and are negative.     Allergies  Actonel and Risedronate  Home Medications   Prior to Admission medications   Medication Sig Start Date End Date Taking? Authorizing Provider  acetaminophen (TYLENOL) 500 MG tablet Take 500 mg by mouth 2 (two) times daily.   Yes Historical Provider, MD  amoxicillin-clavulanate (AUGMENTIN) 875-125 MG tablet Take 1 tablet by mouth 2 (two) times daily. 04/19/16  Yes Houston Siren, MD  Calcium Carb-Cholecalciferol (CALCIUM PLUS VITAMIN D3) 600-800 MG-UNIT TABS Take 1 tablet by mouth 2 (two) times daily.   Yes Historical Provider, MD  furosemide (LASIX) 20 MG tablet Take 20 mg by mouth 2 (two) times daily.   Yes Historical Provider, MD  guaiFENesin-dextromethorphan (ROBITUSSIN DM) 100-10 MG/5ML syrup Take 5 mLs by mouth every 4 (four) hours as needed for cough. 04/19/16  Yes Houston Siren, MD  lisinopril (PRINIVIL,ZESTRIL) 10 MG tablet Take 10 mg by mouth daily.   Yes Historical Provider, MD  polyethylene glycol (MIRALAX / GLYCOLAX) packet Take 17 g by mouth daily as needed.    Yes Historical Provider, MD  potassium citrate (UROCIT-K) 10 MEQ (1080 MG) SR tablet Take 10 mEq by mouth 2 (two) times  daily.    Yes Historical Provider, MD  predniSONE (DELTASONE) 10 MG tablet Label  & dispense according to the schedule below. 5 Pills PO for 1 day then, 4 Pills PO for 1 day, 3 Pills PO for 1 day, 2 Pills PO for 1 day, 1 Pill PO for 1 days then STOP. 04/19/16  Yes Houston SirenVivek J Sainani, MD   BP 97/72 mmHg  Pulse 107  Temp(Src) 98 F (36.7 C) (Oral)  Resp 19  Wt 180 lb (81.647 kg)  SpO2 100% Physical Exam  Constitutional: She is oriented to person, place, and time.  Chronically ill   HENT:  Head: Normocephalic.  Mouth/Throat: Oropharynx is clear and moist.  Eyes: Conjunctivae are normal. Pupils are equal, round, and reactive to light.  Neck: Normal range of motion. Neck supple.  Cardiovascular:  Normal rate, regular rhythm and normal heart sounds.   Pulmonary/Chest: Effort normal.  +crackles bilateral bases   Abdominal: Soft. Bowel sounds are normal. She exhibits no distension. There is no tenderness. There is no rebound.  Musculoskeletal:  2+ edema bilaterally, no obvious calf tenderness   Neurological: She is alert and oriented to person, place, and time.  Skin: Skin is warm and dry.  Psychiatric: She has a normal mood and affect. Her behavior is normal. Judgment and thought content normal.  Nursing note and vitals reviewed.   ED Course  Procedures (including critical care time) Labs Review Labs Reviewed  CBC WITH DIFFERENTIAL/PLATELET - Abnormal; Notable for the following:    WBC 12.2 (*)    RBC 5.31 (*)    MCV 79.9 (*)    RDW 16.2 (*)    Neutro Abs 9.9 (*)    Lymphs Abs 0.8 (*)    Monocytes Absolute 1.2 (*)    All other components within normal limits  COMPREHENSIVE METABOLIC PANEL - Abnormal; Notable for the following:    Sodium 133 (*)    Chloride 98 (*)    Glucose, Bld 161 (*)    BUN 29 (*)    Creatinine, Ser 1.43 (*)    GFR calc non Af Amer 32 (*)    GFR calc Af Amer 37 (*)    All other components within normal limits  TROPONIN I - Abnormal; Notable for the following:    Troponin I 0.08 (*)    All other components within normal limits  BRAIN NATRIURETIC PEPTIDE - Abnormal; Notable for the following:    B Natriuretic Peptide 773.0 (*)    All other components within normal limits    Imaging Review Dg Chest 2 View  04/25/2016  CLINICAL DATA:  Shortness of breath and chest pain for 1 week EXAM: CHEST  2 VIEW COMPARISON:  04/16/2016 FINDINGS: Cardiac shadow remains enlarged. Postsurgical changes are again noted. Diffuse interstitial changes are again seen. No focal infiltrate or sizable effusion is noted. No acute bony abnormality is seen. IMPRESSION: No active cardiopulmonary disease. Electronically Signed   By: Alcide CleverMark  Lukens M.D.   On: 04/25/2016 09:58   I  have personally reviewed and evaluated these images and lab results as part of my medical decision-making.   EKG Interpretation None       ED ECG REPORT I, Liddie Chichester, the attending physician, personally viewed and interpreted this ECG.   Date: 04/25/2016  EKG Time:09:38 am  Rate: 122  Rhythm: atrial fibrillation, rate 122  Axis: normal  Intervals:left bundle branch block  ST&T Change: nonspecific, unchanged since 04/13/16   MDM   Final diagnoses:  None    [name] is a 82 y.o. female here with shortness of breath, leg swelling, weight gain. Concerned for CHF exacerbation. Will get labs, BNP, CXR.   11:18 AM BP dropped to upper 90s after cardizem for rapid afib. Now back up to low 100s. Trop mildly positive at 0.08 likely from CHF exacerbation. BNP increased to 770 from 500. Clinically volume overloaded. Since BP slightly low, will give lasix 20 mg IV. Will admit to r/o ACS, repeat echo for CHF exacerbation.    Richardean Canal, MD 04/25/16 1130

## 2016-04-25 NOTE — Progress Notes (Signed)
Anticoagulation Monitoring  Patient is an 82 yo female with orders for Lovenox 40 mg subq daily. Est CrCl 28 mL/min. Per anticoagulation policy, will transition patient to Lovenox 30 mg subq q24h based on CrCl<30 mL/min.   Pharmacy will continue to monitor per policy.  Clarisa Schoolsrystal Jamaree Hosier, PharmD Clinical Pharmacist 04/25/2016

## 2016-04-26 LAB — BASIC METABOLIC PANEL
Anion gap: 6 (ref 5–15)
BUN: 25 mg/dL — AB (ref 6–20)
CHLORIDE: 96 mmol/L — AB (ref 101–111)
CO2: 31 mmol/L (ref 22–32)
Calcium: 8.8 mg/dL — ABNORMAL LOW (ref 8.9–10.3)
Creatinine, Ser: 1.3 mg/dL — ABNORMAL HIGH (ref 0.44–1.00)
GFR calc Af Amer: 42 mL/min — ABNORMAL LOW (ref >60)
GFR calc non Af Amer: 36 mL/min — ABNORMAL LOW (ref >60)
GLUCOSE: 128 mg/dL — AB (ref 65–99)
POTASSIUM: 4.1 mmol/L (ref 3.5–5.1)
Sodium: 133 mmol/L — ABNORMAL LOW (ref 135–145)

## 2016-04-26 LAB — TROPONIN I: Troponin I: 0.07 ng/mL — ABNORMAL HIGH (ref <0.031)

## 2016-04-26 MED ORDER — FUROSEMIDE 10 MG/ML IJ SOLN
20.0000 mg | Freq: Two times a day (BID) | INTRAMUSCULAR | Status: DC
  Administered 2016-04-26 (×2): 20 mg via INTRAVENOUS
  Filled 2016-04-26 (×2): qty 2

## 2016-04-26 MED ORDER — FUROSEMIDE 10 MG/ML IJ SOLN: 20.0000 mg | Freq: Two times a day (BID) | INTRAMUSCULAR | Status: DC

## 2016-04-26 NOTE — Progress Notes (Signed)
Patient ID: RUQAYA STRAUSS, female   DOB: 1926-08-02, 82 y.o.   MRN: [id number] [facility] [place] at [facility]   PATIENT NAME: Aleene Swanner    MR#:  [id number]  DATE OF BIRTH:  05/03/1926  SUBJECTIVE:  Doing well. Did not get much sleep due to urination all nite  REVIEW OF SYSTEMS:   Review of Systems  Constitutional: Negative for fever, chills and weight loss.  HENT: Negative for ear discharge, ear pain and nosebleeds.   Eyes: Negative for blurred vision, pain and discharge.  Respiratory: Negative for sputum production, shortness of breath, wheezing and stridor.   Cardiovascular: Positive for leg swelling. Negative for chest pain, palpitations, orthopnea and PND.  Gastrointestinal: Negative for nausea, vomiting, abdominal pain and diarrhea.  Genitourinary: Negative for urgency and frequency.  Musculoskeletal: Negative for back pain and joint pain.  Neurological: Negative for sensory change, speech change, focal weakness and weakness.  Psychiatric/Behavioral: Negative for depression and hallucinations. The patient is not nervous/anxious.    Tolerating Diet:yes Tolerating PT: HHPT  DRUG ALLERGIES:   Allergies  Allergen Reactions  . Actonel [Risedronate Sodium]   . Risedronate Rash    VITALS:  Blood pressure 124/85, pulse 110, temperature 97.8 F (36.6 C), temperature source Oral, resp. rate 18, height  (1.651 m), weight 77.747 kg (171 lb 6.4 oz), SpO2 98 %.  PHYSICAL EXAMINATION:   Physical Exam  GENERAL:  82 y.o.-year-old patient lying in the bed with no acute distress.  EYES: Pupils equal, round, reactive to light and accommodation. No scleral icterus. Extraocular muscles intact.  HEENT: Head atraumatic, normocephalic. Oropharynx and nasopharynx clear.  NECK:  Supple, no jugular venous distention. No thyroid enlargement, no tenderness.  LUNGS: Normal breath sounds bilaterally, no wheezing, rales, rhonchi. No use of accessory muscles  of respiration.  CARDIOVASCULAR: S1, S2 normal. No murmurs, rubs, or gallops.  ABDOMEN: Soft, nontender, nondistended. Bowel sounds present. No organomegaly or mass.  EXTREMITIES: No cyanosis, clubbing , ++ edema b/l.    NEUROLOGIC: Cranial nerves II through XII are intact. No focal Motor or sensory deficits b/l.   PSYCHIATRIC:  patient is alert and oriented x 3.  SKIN: No obvious rash, lesion, or ulcer.   LABORATORY PANEL:  CBC  Recent Labs Lab 04/25/16 1007  WBC 12.2*  HGB 13.8  HCT 42.4  PLT 204    Chemistries   Recent Labs Lab 04/25/16 1007 04/26/16 0408  NA 133* 133*  K 4.9 4.1  CL 98* 96*  CO2 28 31  GLUCOSE 161* 128*  BUN 29* 25*  CREATININE 1.43* 1.30*  CALCIUM 8.9 8.8*  AST 37  --   ALT 31  --   ALKPHOS 111  --   BILITOT 1.0  --    Cardiac Enzymes  Recent Labs Lab 04/26/16 0408  TROPONINI 0.07*   RADIOLOGY:  Dg Chest 2 View  04/25/2016  CLINICAL DATA:  Shortness of breath and chest pain for 1 week EXAM: CHEST  2 VIEW COMPARISON:  04/16/2016 FINDINGS: Cardiac shadow remains enlarged. Postsurgical changes are again noted. Diffuse interstitial changes are again seen. No focal infiltrate or sizable effusion is noted. No acute bony abnormality is seen. IMPRESSION: No active cardiopulmonary disease. Electronically Signed   By: Alcide Clever M.D.   On: 04/25/2016 09:58   ASSESSMENT AND PLAN:  Tanisha Lutes is a 82 y.o. female with a known history of Chronic diastolic congestive heart failure, GERD, hypertension, hyperlipidemia, Alzheimer's disease comes in from [facility] with increasing shortness of breath and weight gain of about 7-9 pounds and leg edema.  1. Acute on chronic congestive heart failure diastolic -Patient has had previous echo showed EF of 50-55% -IV Lasix 40 mg twice a day monitor I's and O's monitor creatinine---UOP 16oo cc Weight down from 79.9 kg---77.7 kg sats normal on RA -Cardiology consultation -We will hold off on  lisinopril for now once creatinine remained stable resume it  2. Chronic kidney disease stage III Baseline creatinine around 1.3 Avoid nephrotoxins  3. Generalized weakness. PT recommends HHPT  4. Alzheimer's dementia Patient is from [facility]   5. DVT prophylaxis subcutaneous Lovenox  If remains stable d/c tomorrow D/w dter in the room Case discussed with Care Management/Social Worker. Management plans discussed with the patient, family and they are in agreement.  CODE STATUS: full   TOTAL TIME TAKING CARE OF THIS PATIENT: 30 minutes.  >50% time spent on counselling and coordination of care  POSSIBLE D/C IN 1DAYS, DEPENDING ON CLINICAL CONDITION.  Note: This dictation was prepared with Dragon dictation along with smaller phrase technology. Any transcriptional errors that result from this process are unintentional.  Victorya Hillman M.D on 04/26/2016 at 8:11 AM  Between 7am to 6pm - Pager - [phone]  After 6pm go to [email or web address] - password [email or web address]  [facility]  Office  [phone]  CC: Primary care physician; [facility]

## 2016-04-26 NOTE — Progress Notes (Signed)
Physical Therapy Treatment Patient Details Name: [name] MRN: [id number] DOB: 11/11/1926 Today's Date: 04/26/2016    History of Present Illness [name] is a 82 y.o. female with a known history of Chronic diastolic congestive heart failure, GERD, hypertension, hyperlipidemia, Alzheimer's disease comes in from [facility] with increasing shortness of breath and weight gain of about 7-9 pounds and leg edema. Daughter reports she feels her mother is walking slower than typical.     PT Comments    Pt presented in chair pleasant and alert.  Ambulated 120ft x 1 with no rest breaks cues for PHP on RW and staying close to walker. After seated rest pt able to participate in seated LE therex x 20 as noted in chart.  Pt with good tolerance to all activities.    Follow Up Recommendations        Equipment Recommendations       Recommendations for Other Services       Precautions / Restrictions Precautions Precautions: Fall    Mobility  Bed Mobility               General bed mobility comments: Presented in chair   Transfers Overall transfer level: Needs assistance Equipment used: Rolling walker (2 wheeled)   Sit to Stand: Min guard         General transfer comment: demonstrated good safety with transfer  Ambulation/Gait Ambulation/Gait assistance: Min guard Ambulation Distance (Feet): 100 Feet Assistive device: Rolling walker (2 wheeled)     Gait velocity interpretation: Below normal speed for age/gender General Gait Details: slightly forward flexed, cues for php and keeping within RW   Stairs            Wheelchair Mobility    Modified Rankin (Stroke Patients Only)       Balance Overall balance assessment: Modified Independent Sitting-balance support: No upper extremity supported Sitting balance-Leahy Scale: Good     Standing balance support: Bilateral upper extremity supported Standing balance-Leahy Scale: Fair                       Cognition Arousal/Alertness: Awake/alert Behavior During Therapy: WFL for tasks assessed/performed Overall Cognitive Status: History of cognitive impairments - at baseline       Memory: Decreased short-term memory              Exercises Total Joint Exercises Towel Squeeze: AROM;Strengthening;Both;20 reps;Seated General Exercises - Lower Extremity Ankle Circles/Pumps: AROM;Both;20 reps;Seated Long Arc Quad: AROM;Strengthening;Both;20 reps;Seated Hip ABduction/ADduction: AROM;Strengthening;Both;20 reps;Seated Hip Flexion/Marching: AROM;Strengthening;Both;20 reps;Seated    General Comments        Pertinent Vitals/Pain Pain Assessment: No/denies pain    Home Living                      Prior Function            PT Goals (current goals can now be found in the care plan section) Acute Rehab PT Goals Patient Stated Goal: to get home soon PT Goal Formulation: With patient/family Time For Goal Achievement: 05/09/16 Potential to Achieve Goals: Good    Frequency  Min 2X/week    PT Plan      Co-evaluation             End of Session Equipment Utilized During Treatment: Gait belt Activity Tolerance: Patient tolerated treatment well Patient left: in chair;with call bell/phone within reach;with chair alarm set;with family/visitor present     Time: 1135-1200 PT Time Calculation (  min) (ACUTE ONLY): 25 min  Charges:  $Gait Training: 8-22 mins $Therapeutic Exercise: 8-22 mins                    G Codes:      Keandrea Tapley 04/26/2016, 12:04 PM Ranyia Witting, PTA

## 2016-04-27 LAB — BASIC METABOLIC PANEL
Anion gap: 6 (ref 5–15)
BUN: 21 mg/dL — ABNORMAL HIGH (ref 6–20)
CALCIUM: 8.9 mg/dL (ref 8.9–10.3)
CO2: 32 mmol/L (ref 22–32)
CREATININE: 1.22 mg/dL — AB (ref 0.44–1.00)
Chloride: 96 mmol/L — ABNORMAL LOW (ref 101–111)
GFR calc non Af Amer: 39 mL/min — ABNORMAL LOW (ref >60)
GFR, EST AFRICAN AMERICAN: 45 mL/min — AB (ref >60)
Glucose, Bld: 124 mg/dL — ABNORMAL HIGH (ref 65–99)
Potassium: 3.8 mmol/L (ref 3.5–5.1)
Sodium: 134 mmol/L — ABNORMAL LOW (ref 135–145)

## 2016-04-27 MED ORDER — METOPROLOL TARTRATE 25 MG PO TABS: 25.0000 mg | ORAL_TABLET | Freq: Two times a day (BID) | ORAL | Status: DC

## 2016-04-27 MED ORDER — LISINOPRIL 5 MG PO TABS: 5.0000 mg | ORAL_TABLET | Freq: Every day | ORAL | Status: DC

## 2016-04-27 MED ORDER — METOPROLOL TARTRATE 25 MG PO TABS
25.0000 mg | ORAL_TABLET | Freq: Two times a day (BID) | ORAL | Status: DC
  Administered 2016-04-27: 25 mg via ORAL
  Filled 2016-04-27: qty 1

## 2016-04-27 MED ORDER — LISINOPRIL 5 MG PO TABS
10.0000 mg | ORAL_TABLET | Freq: Every day | ORAL | Status: DC
Start: 2016-04-27 — End: 2016-06-08

## 2016-04-27 MED ORDER — FUROSEMIDE 20 MG PO TABS
20.0000 mg | ORAL_TABLET | Freq: Two times a day (BID) | ORAL | Status: DC
  Administered 2016-04-27: 20 mg via ORAL
  Filled 2016-04-27: qty 1

## 2016-04-27 MED ORDER — POTASSIUM CITRATE ER 10 MEQ (1080 MG) PO TBCR
10.0000 meq | EXTENDED_RELEASE_TABLET | Freq: Two times a day (BID) | ORAL | Status: DC
  Administered 2016-04-27: 10 meq via ORAL
  Filled 2016-04-27: qty 1

## 2016-04-27 MED ORDER — LISINOPRIL 10 MG PO TABS
10.0000 mg | ORAL_TABLET | Freq: Every day | ORAL | Status: DC
Start: 2016-04-27 — End: 2016-04-27

## 2016-04-27 NOTE — Progress Notes (Signed)
Patients HR continues to jump into 120s-140s on occasion, but resolves after 10-15 minutes back to afib rate 110s. MD rounding on the floor and made aware, orders to start metoprolol 25mg  BID and d/c IV furosemide as she states she plans to switch patient to PO lasix. Will continue to monitor.

## 2016-04-27 NOTE — Discharge Summary (Addendum)
[facility] - [place] at [facility]   PATIENT NAME: [name]    MR#:  [id number]  DATE OF BIRTH:  [birth year]  DATE OF ADMISSION:  04/25/2016 ADMITTING PHYSICIAN: Enedina FinnerSona Dalana Pfahler, MD  DATE OF DISCHARGE: 04/27/16  PRIMARY CARE PHYSICIAN: [facility]    ADMISSION DIAGNOSIS:  Acute on chronic diastolic congestive heart failure (HCC) [I50.33]  DISCHARGE DIAGNOSIS:  Acute on Chronic Diastolic Heart failure CKD-III  SECONDARY DIAGNOSIS:   Past Medical History  Diagnosis Date  . Diabetes mellitus     TYPE 2  . Hypertension   . Hyperlipidemia   . Osteoporosis   . GERD (gastroesophageal reflux disease)   . Chronic diastolic CHF (congestive heart failure) (HCC)   . BBB (bundle branch block)     CHRONIC  LEFT BBB  . CHF (congestive heart failure) (HCC)   . Alzheimer disease   . Diastolic heart failure (HCC)   . CKD (chronic kidney disease), stage III   . Atrial fibrillation (HCC)   . Coronary artery disease   . Hyperlipemia     HOSPITAL COURSE:   [name] is a 82 y.o. female with a known history of Chronic diastolic congestive heart failure, GERD, hypertension, hyperlipidemia, Alzheimer's disease comes in from [facility] with increasing shortness of breath and weight gain of about 7-9 pounds and leg edema.  1. Acute on chronic congestive heart failure diastolic -Patient has had previous echo showed EF of 50-55% -IV Lasix 40 mg twice a day monitor I's and O's monitor creatinine---UOP >2300cc---lasix 20 mg bid with oral K -Weight down from 79.9 kg---77.7 kg---75.0 kg -sats normal on RA -resumed lisinopril 5 mg qhs -2 gram sodium diet and weigh every other day at the facility  2. Chronic kidney disease stage III Baseline creatinine around 1.3 Avoid nephrotoxins  3. Generalized weakness. PT recommends HHPT  4. Alzheimer's dementia Patient is from [facility]   5. DVT prophylaxis subcutaneous  Lovenox  Overall improved. D/c to AL D/w family and agreeable with plan CONSULTS OBTAINED:  Treatment Team:  Alwyn Peawayne D Callwood, MD Dalia HeadingKenneth A Fath, MD  DRUG ALLERGIES:   Allergies  Allergen Reactions  . Risedronate Rash    DISCHARGE MEDICATIONS:   Current Discharge Medication List    START taking these medications   Details  metoprolol tartrate (LOPRESSOR) 25 MG tablet Take 1 tablet (25 mg total) by mouth 2 (two) times daily. Qty: 60 tablet, Refills: 2      CONTINUE these medications which have CHANGED   Details  lisinopril (PRINIVIL,ZESTRIL) 5 MG tablet Take 2 tablets (10 mg total) by mouth at bedtime. Qty: 30 tablet, Refills: 0      CONTINUE these medications which have NOT CHANGED   Details  acetaminophen (TYLENOL) 500 MG tablet Take 500 mg by mouth 2 (two) times daily.    Calcium Carb-Cholecalciferol (CALCIUM PLUS VITAMIN D3) 600-800 MG-UNIT TABS Take 1 tablet by mouth 2 (two) times daily.    furosemide (LASIX) 20 MG tablet Take 20 mg by mouth 2 (two) times daily.    guaiFENesin-dextromethorphan (ROBITUSSIN DM) 100-10 MG/5ML syrup Take 5 mLs by mouth every 4 (four) hours as needed for cough. Qty: 118 mL, Refills: 0    polyethylene glycol (MIRALAX / GLYCOLAX) packet Take 17 g by mouth daily as needed.     potassium citrate (UROCIT-K) 10 MEQ (1080 MG) SR tablet Take 10 mEq by mouth 2 (two) times daily.     predniSONE (DELTASONE) 10 MG  tablet Label  & dispense according to the schedule below. 5 Pills PO for 1 day then, 4 Pills PO for 1 day, 3 Pills PO for 1 day, 2 Pills PO for 1 day, 1 Pill PO for 1 days then STOP. Qty: 15 tablet, Refills: 0      STOP taking these medications     amoxicillin-clavulanate (AUGMENTIN) 875-125 MG tablet         If you experience worsening of your admission symptoms, develop shortness of breath, life threatening emergency, suicidal or homicidal thoughts you must seek medical attention immediately by calling 911 or calling your MD  immediately  if symptoms less severe.  You Must read complete instructions/literature along with all the possible adverse reactions/side effects for all the Medicines you take and that have been prescribed to you. Take any new Medicines after you have completely understood and accept all the possible adverse reactions/side effects.   Please note  You were cared for by a hospitalist during your hospital stay. If you have any questions about your discharge medications or the care you received while you were in the hospital after you are discharged, you can call the unit and asked to speak with the hospitalist on call if the hospitalist that took care of you is not available. Once you are discharged, your primary care physician will handle any further medical issues. Please note that NO REFILLS for any discharge medications will be authorized once you are discharged, as it is imperative that you return to your primary care physician (or establish a relationship with a primary care physician if you do not have one) for your aftercare needs so that they can reassess your need for medications and monitor your lab values. Today   SUBJECTIVE   No new complaints  VITAL SIGNS:  Blood pressure 119/78, pulse 58, temperature 97.4 F (36.3 C), temperature source Oral, resp. rate 18, height  (1.651 m), weight 75.796 kg (167 lb 1.6 oz), SpO2 98 %.  I/O:   Intake/Output Summary (Last 24 hours) at 04/27/16 1004 Last data filed at 04/27/16 0004  Gross per 24 hour  Intake    480 ml  Output   1200 ml  Net   -720 ml    PHYSICAL EXAMINATION:  GENERAL:  82 y.o.-year-old patient lying in the bed with no acute distress.  EYES: Pupils equal, round, reactive to light and accommodation. No scleral icterus. Extraocular muscles intact.  HEENT: Head atraumatic, normocephalic. Oropharynx and nasopharynx clear.  NECK:  Supple, no jugular venous distention. No thyroid enlargement, no tenderness.  LUNGS: Normal  breath sounds bilaterally, no wheezing, rales,rhonchi or crepitation. No use of accessory muscles of respiration.  CARDIOVASCULAR: S1, S2 normal. No murmurs, rubs, or gallops.  ABDOMEN: Soft, non-tender, non-distended. Bowel sounds present. No organomegaly or mass.  EXTREMITIES: + pedal edema, no cyanosis, or clubbing.  NEUROLOGIC: Cranial nerves II through XII are intact. Muscle strength 5/5 in all extremities. Sensation intact. Gait not checked.  PSYCHIATRIC: The patient is alert and oriented x 3.  SKIN: No obvious rash, lesion, or ulcer.   DATA REVIEW:   CBC   Recent Labs Lab 04/25/16 1007  WBC 12.2*  HGB 13.8  HCT 42.4  PLT 204    Chemistries   Recent Labs Lab 04/25/16 1007  04/27/16 0536  NA 133*  < > 134*  K 4.9  < > 3.8  CL 98*  < > 96*  CO2 28  < > 32  GLUCOSE 161*  < >  124*  BUN 29*  < > 21*  CREATININE 1.43*  < > 1.22*  CALCIUM 8.9  < > 8.9  AST 37  --   --   ALT 31  --   --   ALKPHOS 111  --   --   BILITOT 1.0  --   --   < > = values in this interval not displayed.  Microbiology Results   Recent Results (from the past 240 hour(s))  Culture, expectorated sputum-assessment     Status: None   Collection Time: 04/17/16 12:10 PM  Result Value Ref Range Status   Specimen Description EXPECTORATED SPUTUM  Final   Special Requests Normal  Final   Sputum evaluation THIS SPECIMEN IS ACCEPTABLE FOR SPUTUM CULTURE  Final   Report Status 04/17/2016 FINAL  Final  Culture, respiratory (NON-Expectorated)     Status: None   Collection Time: 04/17/16 12:10 PM  Result Value Ref Range Status   Specimen Description EXPECTORATED SPUTUM  Final   Special Requests Normal Reflexed from [id number]  Final   Gram Stain   Final    MODERATE WBC SEEN FAIR SPECIMEN - 70-80% WBCS NO ORGANISMS SEEN    Culture LIGHT GROWTH CANDIDA ALBICANS  Final   Report Status 04/22/2016 FINAL  Final    RADIOLOGY:  No results found.   Management plans discussed with the patient, family and  they are in agreement.  CODE STATUS:     Code Status Orders        Start     Ordered   04/25/16 1226  Full code   Continuous     04/25/16 1225    Code Status History    Date Active Date Inactive Code Status Order ID Comments User Context   04/25/2016 11:58 AM 04/25/2016 12:25 PM Full Code [id number]  Enedina Finner, MD ED   04/13/2016 11:31 PM 04/19/2016  4:45 PM Full Code [id number]  Oralia Manis, MD Inpatient    Advance Directive Documentation        Most Recent Value   Type of Advance Directive  Healthcare Power of Attorney   Pre-existing out of facility DNR order (yellow form or pink MOST form)     "MOST" Form in Place?        TOTAL TIME TAKING CARE OF THIS PATIENT: .    Grantham Hippert M.D on 04/27/2016 at 10:04 AM  Between 7am to 6pm - Pager - [phone] After 6pm go to [email or web address] - password [email or web address] [facility]  [place] [facility]  Office  [phone]  CC: Primary care physician; [facility]

## 2016-04-27 NOTE — Care Management (Signed)
Patient to discharge today back to [facility].  Home health resumption orders for PT and RN.  [facility]  RNCM signing off.

## 2016-04-27 NOTE — Progress Notes (Signed)
Physical Therapy Treatment Patient Details Name: [name] MRN: [id number] DOB: 1926/07/26 Today's Date: 04/27/2016    History of Present Illness [name] is a 82 y.o. female with a known history of Chronic diastolic congestive heart failure, GERD, hypertension, hyperlipidemia, Alzheimer's disease comes in from [facility] with increasing shortness of breath and weight gain of about 7-9 pounds and leg edema. Daughter reports she feels her mother is walking slower than typical.     PT Comments    Pt in chair ready for session.  Pt was able to stand with min guard and use of arms.  She ambulated 100' with walker and overall steady gait.  Pt was able to complete seated exercises as described below.  No complaints with session.  Pt reports anticipated discharge back to [facility] today.   Follow Up Recommendations  Supervision/Assistance - 24 hour;Home health PT     Equipment Recommendations       Recommendations for Other Services       Precautions / Restrictions Precautions Precautions: Fall    Mobility  Bed Mobility               General bed mobility comments: Presented in chair   Transfers Overall transfer level: Needs assistance Equipment used: Rolling walker (2 wheeled)   Sit to Stand: Min guard         General transfer comment: demonstrated good safety with transfer  Ambulation/Gait Ambulation/Gait assistance: Min guard Ambulation Distance (Feet): 100 Feet Assistive device: Rolling walker (2 wheeled) Gait Pattern/deviations: Step-through pattern Gait velocity: decreased Gait velocity interpretation: <1.8 ft/sec, indicative of risk for recurrent falls General Gait Details: verbal cues for walker position   Stairs            Wheelchair Mobility    Modified Rankin (Stroke Patients Only)       Balance Overall balance assessment: Modified Independent Sitting-balance support: No upper extremity supported Sitting  balance-Leahy Scale: Good     Standing balance support: Bilateral upper extremity supported Standing balance-Leahy Scale: Fair                      Cognition Arousal/Alertness: Awake/alert Behavior During Therapy: WFL for tasks assessed/performed Overall Cognitive Status: History of cognitive impairments - at baseline       Memory: Decreased short-term memory              Exercises General Exercises - Lower Extremity Ankle Circles/Pumps: AROM;Both;20 reps;Seated Long Arc Quad: AROM;Strengthening;Both;20 reps;Seated Hip ABduction/ADduction: AROM;Strengthening;Both;20 reps;Seated Hip Flexion/Marching: AROM;Strengthening;Both;20 reps;Seated    General Comments        Pertinent Vitals/Pain Pain Assessment: No/denies pain    Home Living                      Prior Function            PT Goals (current goals can now be found in the care plan section)      Frequency  Min 2X/week    PT Plan      Co-evaluation             End of Session Equipment Utilized During Treatment: Gait belt Activity Tolerance: Patient tolerated treatment well Patient left: in chair;with call bell/phone within reach;with chair alarm set;with family/visitor present     Time: [id number] PT Time Calculation (min) (ACUTE ONLY): 27 min  Charges:  $Gait Training: 8-22 mins $Therapeutic Exercise: 8-22 mins  G Codes:      Danielle Dess, PTA 04/27/2016, 9:11 AM

## 2016-04-27 NOTE — Consult Note (Addendum)
Reason for Consult: Congestive heart failure leg edema shortness of breath Referring Physician: Fritzi Mandes hospitalist, [place]. Cardiologist Dr. Alycia Patten [name] is an 82 y.o. female.  HPI: Patient presents with significant weight gain over the last few weeks she was recently admitted and treated for chronic diastolic congestive heart failure and leg edema which shortness of breath she is a resident of [facility] assisted living. Patient states that she's had some shortness of breath and significant weight gain with leg swelling still is on diabetes management and hypertension management. It is not clear the patient restricts sodium intake. She's had some cough and congestion but mostly leg swelling and weight gain so she was sent to the emergency room for evaluation  Past Medical History  Diagnosis Date  . Diabetes mellitus     TYPE 2  . Hypertension   . Hyperlipidemia   . Osteoporosis   . GERD (gastroesophageal reflux disease)   . Chronic diastolic CHF (congestive heart failure) ([place])   . BBB (bundle branch block)     CHRONIC  LEFT BBB  . CHF (congestive heart failure) ([place])   . Alzheimer disease   . Diastolic heart failure ([place])   . CKD (chronic kidney disease), stage III   . Atrial fibrillation ([place])   . Coronary artery disease   . Hyperlipemia     Past Surgical History  Procedure Laterality Date  . Cholecystectomy    . Abdominal hysterectomy    . Colonoscopy w/ polypectomy  7/2OO8       05/2010    NEOPLASTIC  POLYPS  . Cabg x 5  08/10/2011    DR BARTLE  . Coronary artery bypass graft  2012    Family History  Problem Relation Age of Onset  . Hypertension Mother   . Heart disease Father     Social History:  reports that she has quit smoking. She quit smokeless tobacco use about 35 years ago. She reports that she does not drink alcohol or use illicit drugs.  Allergies:  Allergies  Allergen Reactions  . Risedronate Rash    Medications: Denise have  reviewed the patient's current medications.  Results for orders placed or performed during the hospital encounter of 04/25/16 (from the past 48 hour(s))  Troponin Denise     Status: Abnormal   Collection Time: 04/25/16  4:06 PM  Result Value Ref Range   Troponin Denise 0.08 (H) <0.031 ng/mL    Comment: PREVIOUS RESULT CALLED AT 1100 04/25/2016 BY DAS.  TFK        PERSISTENTLY INCREASED TROPONIN VALUES IN THE RANGE OF 0.04-0.49 ng/mL CAN BE SEEN IN:       -UNSTABLE ANGINA       -CONGESTIVE HEART FAILURE       -MYOCARDITIS       -CHEST TRAUMA       -ARRYHTHMIAS       -LATE PRESENTING MYOCARDIAL INFARCTION       -COPD   CLINICAL FOLLOW-UP RECOMMENDED.   Troponin Denise     Status: Abnormal   Collection Time: 04/25/16 10:17 PM  Result Value Ref Range   Troponin Denise 0.07 (H) <0.031 ng/mL    Comment: PREVIOUS RESULT CALLED TO AMBER JONES ON 04/25/16 AT 1100 BY DAS/TLB        PERSISTENTLY INCREASED TROPONIN VALUES IN THE RANGE OF 0.04-0.49 ng/mL CAN BE SEEN IN:       -UNSTABLE ANGINA       -CONGESTIVE HEART FAILURE       -  MYOCARDITIS       -CHEST TRAUMA       -ARRYHTHMIAS       -LATE PRESENTING MYOCARDIAL INFARCTION       -COPD   CLINICAL FOLLOW-UP RECOMMENDED.   Troponin Denise     Status: Abnormal   Collection Time: 04/26/16  4:08 AM  Result Value Ref Range   Troponin Denise 0.07 (H) <0.031 ng/mL    Comment: PREVIOUS RESULT CALLED TO AMBER JONES AT 1100 ON 04/25/16 BY DAS.Marland KitchenMarland KitchenIdeal        PERSISTENTLY INCREASED TROPONIN VALUES IN THE RANGE OF 0.04-0.49 ng/mL CAN BE SEEN IN:       -UNSTABLE ANGINA       -CONGESTIVE HEART FAILURE       -MYOCARDITIS       -CHEST TRAUMA       -ARRYHTHMIAS       -LATE PRESENTING MYOCARDIAL INFARCTION       -COPD   CLINICAL FOLLOW-UP RECOMMENDED.   Basic metabolic panel     Status: Abnormal   Collection Time: 04/26/16  4:08 AM  Result Value Ref Range   Sodium 133 (L) 135 - 145 mmol/L   Potassium 4.1 3.5 - 5.1 mmol/L   Chloride 96 (L) 101 - 111 mmol/L   CO2 31  22 - 32 mmol/L   Glucose, Bld 128 (H) 65 - 99 mg/dL   BUN 25 (H) 6 - 20 mg/dL   Creatinine, Ser 1.30 (H) 0.44 - 1.00 mg/dL   Calcium 8.8 (L) 8.9 - 10.3 mg/dL   GFR calc non Af Amer 36 (L) >60 mL/min   GFR calc Af Amer 42 (L) >60 mL/min    Comment: (NOTE) The eGFR has been calculated using the CKD EPI equation. This calculation has not been validated in all clinical situations. eGFR's persistently <60 mL/min signify possible Chronic Kidney Disease.    Anion gap 6 5 - 15  Basic metabolic panel     Status: Abnormal   Collection Time: 04/27/16  5:36 AM  Result Value Ref Range   Sodium 134 (L) 135 - 145 mmol/L   Potassium 3.8 3.5 - 5.1 mmol/L   Chloride 96 (L) 101 - 111 mmol/L   CO2 32 22 - 32 mmol/L   Glucose, Bld 124 (H) 65 - 99 mg/dL   BUN 21 (H) 6 - 20 mg/dL   Creatinine, Ser 1.22 (H) 0.44 - 1.00 mg/dL   Calcium 8.9 8.9 - 10.3 mg/dL   GFR calc non Af Amer 39 (L) >60 mL/min   GFR calc Af Amer 45 (L) >60 mL/min    Comment: (NOTE) The eGFR has been calculated using the CKD EPI equation. This calculation has not been validated in all clinical situations. eGFR's persistently <60 mL/min signify possible Chronic Kidney Disease.    Anion gap 6 5 - 15    No results found.  Review of Systems  Constitutional: Positive for malaise/fatigue.  HENT: Positive for congestion.   Eyes: Negative.   Respiratory: Positive for shortness of breath.   Cardiovascular: Positive for leg swelling.  Gastrointestinal: Negative.   Genitourinary: Negative.   Musculoskeletal: Negative.   Skin: Negative.   Neurological: Positive for weakness.  Endo/Heme/Allergies: Negative.   Psychiatric/Behavioral: Negative.    Blood pressure 119/78, pulse 58, temperature 97.4 F (36.3 C), temperature source Oral, resp. rate 18, height _0  (1.651 m), weight 75.796 kg (167 lb 1.6 oz), SpO2 98 %. Physical Exam  Assessment/Plan: Congestive heart failure Shortness of  breath Edema Diabetes  Hyperlipidemia GERD Abnormal EKG with left bundle branch block Dementia Chronic renal insufficiency Atrial fibrillation paroxysmal . PLAN Agree with telemetry admission and evaluation Recommend diuretic therapy to help with heart failure Patient needs strict diet and salt restrictions Continue lisinopril for hypertension and heart failure Agree with metoprolol therapy for heart failure symptoms and blood pressure control Continue Robitussin for cough and congestion Consider omeprazole for reflux symptoms Follow-up with nephrology for renal insufficiency Diabetes management with diet control   CALLWOOD,DWAYNE D. 04/27/2016, 1:51 PM

## 2016-04-27 NOTE — Progress Notes (Signed)
Slept at short intervals.  Confused and frequently needed reorienting to place/time.  Afib continues with rates 80-100s.  RVR to 120s with ambulation to bathroom.

## 2016-04-27 NOTE — Discharge Instructions (Signed)
2 gram sodium diet. Weigh every other day and keep log of it  Heart Failure Clinic appointment on May 05, 2016 at 11:00am with Clarisa Kindredina Hackney, FNP. Please call [phone] to reschedule.

## 2016-04-27 NOTE — Clinical Social Work Note (Signed)
Clinical Social Work Assessment  Patient Details  Name: [name] MRN: [id number] Date of Birth: Jun 30, 1926  Date of referral:  04/27/16               Reason for consult:  Discharge Planning                Permission sought to share information with:  Family Supports Permission granted to share information::  Yes, Verbal Permission Granted  Name::        Agency::     Relationship::   Enid Derry- Daughter)  Contact Information:     Housing/Transportation Living arrangements for the past 2 months:  [place] ([facility]) Source of Information:  Patient, Adult Children Patient Interpreter Needed:  None Criminal Activity/Legal Involvement Pertinent to Current Situation/Hospitalization:  No - Comment as needed Significant Relationships:  Adult Children, Other Family Members Lives with:  Facility Resident ([facility]) Do you feel safe going back to the place where you live?  Yes Need for family participation in patient care:  Yes (Comment) Enid Derry- Daughter)  Care giving concerns:  Patient is from [place]   Social Worker assessment / plan:  CSW is familiar with patient due to recent hospitalization. CSW met with patient and her daughter at bedside. Per patient's daughter she will return to [facility] today. Reported they will transport patient. Stated that [facility] staff has not been weighing patient daily or feeding her a low sodium diet. Stated that they also did not move her to the other facility as the promised last hospitalization. Stated they will follow up with [facility] to discuss patient moving to another facility. Stated they'd like [facility] to follow MDs orders so patient will not return to [facility] for weight gain due to them not monitoring her diet or weighing her daily. CSW and RN discussed her calling report to [facility] to ensure they understand MD orders. She will call at discharge. CSW contacted Thayer Headings from [facility] and informed her that  patient is discharging today. Faxed FL2/ PASRR and discharge information to [phone] per Thayer Headings. RN will call report to Mickel Baas at [phone] and patient will discharge to [facility] today via her family.  Employment status:  Retired Forensic scientist:  Medicare PT Recommendations:  Not assessed at this time Information / Referral to community resources:     Patient/Family's Response to care:  Stated that they are appreciative of care at [facility] but feels like hospitalization can be avoided if [facility] would follow MD orders.   Patient/Family's Understanding of and Emotional Response to Diagnosis, Current Treatment, and Prognosis:  Patient's family has an understanding of patient's diagnosis and reports they are upset with the care she's received at [facility]. Reported they want [facility] staff to follow MD orders. Stated RN calling [facility] may be a help to them an they are appreciative of CSW's and RNs assistance.   Emotional Assessment Appearance:  Appears stated age Attitude/Demeanor/Rapport:   (None) Affect (typically observed):  Calm, Pleasant Orientation:  Oriented to Self, Oriented to Place, Oriented to  Time Alcohol / Substance use:  Not Applicable Psych involvement (Current and /or in the community):  No (Comment)  Discharge Needs  Concerns to be addressed:  No discharge needs identified Readmission within the last 30 days:  No Current discharge risk:  None Barriers to Discharge:  No Barriers Identified   [place], LCSW 04/27/2016, 11:44 AM

## 2016-04-27 NOTE — Progress Notes (Addendum)
Patient discharged to [facility], via wc and ride from daughter and son-in-law. Patient assessment unchanged from this morning, IV and TELE d/c'd per protocol. Report called to Vernona RiegerLaura, nurse at ALF. Discussed low salt diet and weighing Ms. Montez Moritaarter everyday. This RN ensured that physician specifically included these in discharge note for [facility] staff. Discharge instructions and f/u appts reviewed at bedside with patient and family. They report understanding of instructions and medication regimen, all questions answered to reported satisfaction. Packet, including prescriptions, prepared by CSW given to family to [facility].

## 2016-04-27 NOTE — Progress Notes (Signed)
Patients family at bedside reporting concern that facility, [facility], does not follow patients diet orders or weigh her daily. Family gave examples of food patient is given, such as pizza and hamburger helper. This RN encourage family to talk with [facility] staff about their concerns and to try and provide their mother, [name], with healthy snack options. If patient is d/c'd to [facility] today, this RN will also specifically mention and impress the importance of daily weights and low sodium diet with nurse in report.

## 2016-04-27 NOTE — [place] FL2 (Signed)
[place] MEDICAID FL2 LEVEL OF CARE SCREENING TOOL     IDENTIFICATION  Patient Name: [name] Birthdate: 17-Nov-1926 Sex: female Admission Date (Current Location): 04/25/2016  [place] [place]   Facility and Address:  [facility], [address], [place], [place] [address]      Provider Number: [id number]  Attending Physician Name and Address:  Enedina FinnerSona Patel, MD  Relative Name and Phone Number:       Current Level of Care: Hospital Recommended Level of Care: Assisted Living Facility Prior Approval Number:    Date Approved/Denied:   PASRR Number:  [id number])  Discharge Plan: Domiciliary (Rest home)    Current Diagnoses: Patient Active Problem List   Diagnosis Date Noted  . Acute exacerbation of CHF (congestive heart failure) (HCC) 04/25/2016  . CHF, stage C, acute on chronic, diastolic 04/25/2016  . AKI (acute kidney injury) (HCC) 04/13/2016  . UTI (lower urinary tract infection) 04/13/2016  . Skin lesion on examination 08/06/2015  . Chronic diastolic CHF (congestive heart failure) (HCC) 05/11/2015  . Vitamin B 12 deficiency 05/11/2015  . Type 2 diabetes mellitus without complication (HCC)   . Hypertension   . Hyperlipidemia   . Osteoporosis   . GERD (gastroesophageal reflux disease)   . BBB (bundle branch block)     Orientation RESPIRATION BLADDER Height & Weight     Self, Place  Normal Continent Weight: 167 lb 1.6 oz (75.796 kg) Height:  5\' 5"  (165.1 cm)  BEHAVIORAL SYMPTOMS/MOOD NEUROLOGICAL BOWEL NUTRITION STATUS   (None)  (None) Continent Diet (2 gram sodium )  AMBULATORY STATUS COMMUNICATION OF NEEDS Skin   Supervision Verbally Normal                       Personal Care Assistance Level of Assistance  Bathing, Feeding, Dressing Bathing Assistance: Limited assistance Feeding assistance: Independent Dressing Assistance: Limited assistance     Functional Limitations Info  Sight, Hearing,  Speech Sight Info: Impaired Hearing Info: Adequate Speech Info: Adequate    SPECIAL CARE FACTORS FREQUENCY  PT (By licensed PT)     PT Frequency:  (2-3 times per week)              Contractures      Additional Factors Info  Code Status, Allergies Code Status Info:  (Full Code) Allergies Info:  (Risedronate)            Discharge Medications: DISCHARGE MEDICATIONS:   Current Discharge Medication List    START taking these medications   Details  metoprolol tartrate (LOPRESSOR) 25 MG tablet Take 1 tablet (25 mg total) by mouth 2 (two) times daily. Qty: 60 tablet, Refills: 2      CONTINUE these medications which have CHANGED   Details  lisinopril (PRINIVIL,ZESTRIL) 5 MG tablet Take 2 tablets (10 mg total) by mouth at bedtime. Qty: 30 tablet, Refills: 0      CONTINUE these medications which have NOT CHANGED   Details  acetaminophen (TYLENOL) 500 MG tablet Take 500 mg by mouth 2 (two) times daily.    Calcium Carb-Cholecalciferol (CALCIUM PLUS VITAMIN D3) 600-800 MG-UNIT TABS Take 1 tablet by mouth 2 (two) times daily.    furosemide (LASIX) 20 MG tablet Take 20 mg by mouth 2 (two) times daily.    guaiFENesin-dextromethorphan (ROBITUSSIN DM) 100-10 MG/5ML syrup Take 5 mLs by mouth every 4 (four) hours as needed for cough. Qty: 118 mL, Refills: 0    polyethylene glycol (  MIRALAX / GLYCOLAX) packet Take 17 g by mouth daily as needed.     potassium citrate (UROCIT-K) 10 MEQ (1080 MG) SR tablet Take 10 mEq by mouth 2 (two) times daily.     predniSONE (DELTASONE) 10 MG tablet Label & dispense according to the schedule below. 5 Pills PO for 1 day then, 4 Pills PO for 1 day, 3 Pills PO for 1 day, 2 Pills PO for 1 day, 1 Pill PO for 1 days then STOP. Qty: 15 tablet, Refills: 0      STOP taking these medications     amoxicillin-clavulanate (AUGMENTIN) 875-125 MG tablet          Relevant Imaging Results:  Relevant  Lab Results:   Additional Information  (SSN [id number])  [place] Ellen Jocelyne Reinertsen, LCSW

## 2016-04-27 NOTE — Progress Notes (Signed)
Initial Heart Failure Clinic appointment scheduled for May 05, 2016 at 11:00am. Thank you.

## 2016-05-05 ENCOUNTER — Encounter: Payer: Self-pay | Admitting: Family

## 2016-05-05 ENCOUNTER — Ambulatory Visit: Payer: Medicare Other | Attending: Family | Admitting: Family

## 2016-05-05 VITALS — BP 103/72 | HR 79 | Resp 18 | Ht 64.0 in | Wt 170.0 lb

## 2016-05-05 DIAGNOSIS — F0280 Dementia in other diseases classified elsewhere without behavioral disturbance: Secondary | ICD-10-CM | POA: Diagnosis not present

## 2016-05-05 DIAGNOSIS — Z8249 Family history of ischemic heart disease and other diseases of the circulatory system: Secondary | ICD-10-CM | POA: Diagnosis not present

## 2016-05-05 DIAGNOSIS — I2510 Atherosclerotic heart disease of native coronary artery without angina pectoris: Secondary | ICD-10-CM | POA: Diagnosis not present

## 2016-05-05 DIAGNOSIS — E785 Hyperlipidemia, unspecified: Secondary | ICD-10-CM | POA: Diagnosis not present

## 2016-05-05 DIAGNOSIS — M7989 Other specified soft tissue disorders: Secondary | ICD-10-CM | POA: Insufficient documentation

## 2016-05-05 DIAGNOSIS — Z951 Presence of aortocoronary bypass graft: Secondary | ICD-10-CM | POA: Diagnosis not present

## 2016-05-05 DIAGNOSIS — G309 Alzheimer's disease, unspecified: Secondary | ICD-10-CM | POA: Diagnosis not present

## 2016-05-05 DIAGNOSIS — R600 Localized edema: Secondary | ICD-10-CM | POA: Insufficient documentation

## 2016-05-05 DIAGNOSIS — I130 Hypertensive heart and chronic kidney disease with heart failure and stage 1 through stage 4 chronic kidney disease, or unspecified chronic kidney disease: Secondary | ICD-10-CM | POA: Diagnosis not present

## 2016-05-05 DIAGNOSIS — I5032 Chronic diastolic (congestive) heart failure: Secondary | ICD-10-CM | POA: Insufficient documentation

## 2016-05-05 DIAGNOSIS — Z9049 Acquired absence of other specified parts of digestive tract: Secondary | ICD-10-CM | POA: Diagnosis not present

## 2016-05-05 DIAGNOSIS — E1122 Type 2 diabetes mellitus with diabetic chronic kidney disease: Secondary | ICD-10-CM | POA: Insufficient documentation

## 2016-05-05 DIAGNOSIS — Z888 Allergy status to other drugs, medicaments and biological substances status: Secondary | ICD-10-CM | POA: Insufficient documentation

## 2016-05-05 DIAGNOSIS — R0602 Shortness of breath: Secondary | ICD-10-CM | POA: Diagnosis not present

## 2016-05-05 DIAGNOSIS — Z87891 Personal history of nicotine dependence: Secondary | ICD-10-CM | POA: Insufficient documentation

## 2016-05-05 DIAGNOSIS — Z90710 Acquired absence of both cervix and uterus: Secondary | ICD-10-CM | POA: Diagnosis not present

## 2016-05-05 DIAGNOSIS — M6281 Muscle weakness (generalized): Secondary | ICD-10-CM | POA: Insufficient documentation

## 2016-05-05 DIAGNOSIS — K219 Gastro-esophageal reflux disease without esophagitis: Secondary | ICD-10-CM | POA: Diagnosis not present

## 2016-05-05 DIAGNOSIS — I499 Cardiac arrhythmia, unspecified: Secondary | ICD-10-CM | POA: Diagnosis not present

## 2016-05-05 DIAGNOSIS — I4891 Unspecified atrial fibrillation: Secondary | ICD-10-CM | POA: Insufficient documentation

## 2016-05-05 DIAGNOSIS — I10 Essential (primary) hypertension: Secondary | ICD-10-CM

## 2016-05-05 DIAGNOSIS — N183 Chronic kidney disease, stage 3 (moderate): Secondary | ICD-10-CM | POA: Diagnosis not present

## 2016-05-05 NOTE — Patient Instructions (Signed)
Continue weighing daily and call for an overnight weight gain of > 2 pounds or a weekly weight gain of >5 pounds. 

## 2016-05-05 NOTE — Progress Notes (Signed)
Subjective:    Patient ID: [name], female    DOB: [birth year], 82 y.o.   MRN: [id number]  Congestive Heart Failure Presents for initial visit. The disease course has been stable. Associated symptoms include edema, fatigue, muscle weakness (in legs today) and shortness of breath. Pertinent negatives include no abdominal pain, chest pain, orthopnea or palpitations. The symptoms have been stable. Past treatments include salt and fluid restriction, beta blockers and ACE inhibitors. The treatment provided moderate relief. Compliance with prior treatments has been good. Her past medical history is significant for arrhythmia, CAD, DM and HTN. She has one 1st degree relative with heart disease.  Hypertension This is a chronic problem. The current episode started more than 1 year ago. The problem is unchanged. The problem is controlled. Associated symptoms include peripheral edema and shortness of breath. Pertinent negatives include no chest pain, headaches, neck pain or palpitations. There are no associated agents to hypertension. Risk factors for coronary artery disease include diabetes mellitus, dyslipidemia, family history, post-menopausal state and sedentary lifestyle. Past treatments include beta blockers, ACE inhibitors, diuretics and lifestyle changes. The current treatment provides significant improvement. Compliance problems include exercise.  Hypertensive end-organ damage includes kidney disease, CAD/MI and heart failure.   Past Medical History  Diagnosis Date  . Diabetes mellitus     TYPE 2  . Hypertension   . Hyperlipidemia   . Osteoporosis   . GERD (gastroesophageal reflux disease)   . Chronic diastolic CHF (congestive heart failure) (HCC)   . BBB (bundle branch block)     CHRONIC  LEFT BBB  . CHF (congestive heart failure) (HCC)   . Alzheimer disease   . CKD (chronic kidney disease), stage III   . Atrial fibrillation (HCC)   . Coronary artery disease     Past Surgical  History  Procedure Laterality Date  . Cholecystectomy    . Abdominal hysterectomy    . Colonoscopy w/ polypectomy  7/2OO8       05/2010    NEOPLASTIC  POLYPS  . Cabg x 5  08/10/2011    DR BARTLE  . Coronary artery bypass graft  2012    Family History  Problem Relation Age of Onset  . Hypertension Mother   . Heart disease Father     Social History  Substance Use Topics  . Smoking status: Former Games developermoker  . Smokeless tobacco: Former NeurosurgeonUser    Quit date: 11/27/1980  . Alcohol Use: No    Allergies  Allergen Reactions  . Risedronate Rash    Prior to Admission medications   Medication Sig Start Date End Date Taking? Authorizing Provider  acetaminophen (TYLENOL) 500 MG tablet Take 500 mg by mouth 2 (two) times daily.   Yes Historical Provider, MD  Calcium Carb-Cholecalciferol (CALCIUM PLUS VITAMIN D3) 600-800 MG-UNIT TABS Take 1 tablet by mouth 2 (two) times daily.   Yes Historical Provider, MD  furosemide (LASIX) 20 MG tablet Take 20 mg by mouth 2 (two) times daily.   Yes Historical Provider, MD  guaiFENesin (MUCINEX) 600 MG 12 hr tablet Take 600 mg by mouth 2 (two) times daily.   Yes Historical Provider, MD  guaiFENesin-dextromethorphan (ROBITUSSIN DM) 100-10 MG/5ML syrup Take 5 mLs by mouth every 4 (four) hours as needed for cough. 04/19/16  Yes Houston SirenVivek J Sainani, MD  lisinopril (PRINIVIL,ZESTRIL) 5 MG tablet Take 2 tablets (10 mg total) by mouth at bedtime. 04/27/16  Yes Enedina FinnerSona Patel, MD  metoprolol tartrate (LOPRESSOR) 25 MG tablet Take 1 tablet (  25 mg total) by mouth 2 (two) times daily. 04/27/16  Yes Enedina Finner, MD  polyethylene glycol (MIRALAX / GLYCOLAX) packet Take 17 g by mouth daily as needed.    Yes Historical Provider, MD  potassium citrate (UROCIT-K) 10 MEQ (1080 MG) SR tablet Take 10 mEq by mouth 2 (two) times daily.    Yes Historical Provider, MD      Review of Systems  Constitutional: Positive for appetite change and fatigue.  HENT: Negative for congestion, postnasal drip  and sore throat.   Eyes: Negative.   Respiratory: Positive for shortness of breath. Negative for cough, chest tightness and wheezing.   Cardiovascular: Positive for leg swelling (more in right leg than left). Negative for chest pain and palpitations.  Gastrointestinal: Negative for abdominal pain and abdominal distention.  Endocrine: Negative.   Genitourinary: Negative.   Musculoskeletal: Positive for muscle weakness (in legs today). Negative for back pain and neck pain.  Skin: Negative.   Allergic/Immunologic: Negative.   Neurological: Positive for weakness (in legs). Negative for dizziness, light-headedness and headaches.  Hematological: Negative for adenopathy. Does not bruise/bleed easily.  Psychiatric/Behavioral: Negative for sleep disturbance (sleeping on 2 pillows) and dysphoric mood. The patient is not nervous/anxious.        Objective:   Physical Exam  Constitutional: She is oriented to person, place, and time. She appears well-developed and well-nourished.  HENT:  Head: Normocephalic and atraumatic.  Eyes: Conjunctivae are normal. Pupils are equal, round, and reactive to light.  Neck: Normal range of motion. Neck supple.  Cardiovascular: Normal rate and regular rhythm.   Pulmonary/Chest: Effort normal. She has no wheezes. She has no rales.  Abdominal: Soft. She exhibits no distension. There is no tenderness.  Musculoskeletal: She exhibits edema (2+ pitting edema in bilateral lower legs with the right having more than the left). She exhibits no tenderness.  Neurological: She is alert and oriented to person, place, and time.  Skin: Skin is warm and dry.  Psychiatric: She has a normal mood and affect. Her behavior is normal. Thought content normal.  Nursing note and vitals reviewed.   BP 103/72 mmHg  Pulse 79  Resp 18  Ht  (1.626 m)  Wt 170 lb (77.111 kg)  BMI 29.17 kg/m2  SpO2 99%       Assessment & Plan:  1: Chronic heart failure with preserved ejection  fraction- Patient presents with fatigue and shortness of breath with exertion (Class II) which is relieved upon rest. She came into the office in a wheelchair because she says that her legs are weak today. She is being [facility] and the aide says that her weight has been stable since discharge. Instructed them to call for an overnight weight gain of >2 pounds or a weekly weight gain of >5 pounds. She is not adding any salt to her food and the aide says that they don't cook their food with any salt as well. She does have swelling in both of her legs and she admits that she doesn't elevate them much. Encouraged her to elevate them as much as possible when she's sitting. May benefit from TED hose as well. She does receive PT 2-3 days a week.  2: HTN- Blood pressure looks good today. Continue medications.  Medication list that was brought with them was reviewed.  Return in 1 month or sooner for any questions/problems before then.

## 2016-05-23 NOTE — Telephone Encounter (Signed)
Error

## 2016-06-05 ENCOUNTER — Emergency Department: Payer: Medicare Other

## 2016-06-05 ENCOUNTER — Encounter: Payer: Self-pay | Admitting: Emergency Medicine

## 2016-06-05 ENCOUNTER — Inpatient Hospital Stay
Admission: EM | Admit: 2016-06-05 | Discharge: 2016-06-08 | DRG: 291 | Disposition: A | Discharge status: Skilled Nursing Facility | Payer: Medicare Other | Attending: Internal Medicine | Admitting: Internal Medicine

## 2016-06-05 DIAGNOSIS — I447 Left bundle-branch block, unspecified: Secondary | ICD-10-CM | POA: Diagnosis present

## 2016-06-05 DIAGNOSIS — M810 Age-related osteoporosis without current pathological fracture: Secondary | ICD-10-CM | POA: Diagnosis present

## 2016-06-05 DIAGNOSIS — I509 Heart failure, unspecified: Secondary | ICD-10-CM

## 2016-06-05 DIAGNOSIS — Z888 Allergy status to other drugs, medicaments and biological substances status: Secondary | ICD-10-CM

## 2016-06-05 DIAGNOSIS — I2510 Atherosclerotic heart disease of native coronary artery without angina pectoris: Secondary | ICD-10-CM | POA: Diagnosis present

## 2016-06-05 DIAGNOSIS — K219 Gastro-esophageal reflux disease without esophagitis: Secondary | ICD-10-CM | POA: Diagnosis present

## 2016-06-05 DIAGNOSIS — F0280 Dementia in other diseases classified elsewhere without behavioral disturbance: Secondary | ICD-10-CM | POA: Diagnosis present

## 2016-06-05 DIAGNOSIS — Z87891 Personal history of nicotine dependence: Secondary | ICD-10-CM | POA: Diagnosis not present

## 2016-06-05 DIAGNOSIS — N183 Chronic kidney disease, stage 3 (moderate): Secondary | ICD-10-CM | POA: Diagnosis present

## 2016-06-05 DIAGNOSIS — G309 Alzheimer's disease, unspecified: Secondary | ICD-10-CM | POA: Diagnosis present

## 2016-06-05 DIAGNOSIS — D538 Other specified nutritional anemias: Secondary | ICD-10-CM | POA: Diagnosis present

## 2016-06-05 DIAGNOSIS — Z951 Presence of aortocoronary bypass graft: Secondary | ICD-10-CM

## 2016-06-05 DIAGNOSIS — I130 Hypertensive heart and chronic kidney disease with heart failure and stage 1 through stage 4 chronic kidney disease, or unspecified chronic kidney disease: Secondary | ICD-10-CM | POA: Diagnosis present

## 2016-06-05 DIAGNOSIS — Z8249 Family history of ischemic heart disease and other diseases of the circulatory system: Secondary | ICD-10-CM | POA: Diagnosis not present

## 2016-06-05 DIAGNOSIS — J9811 Atelectasis: Secondary | ICD-10-CM | POA: Diagnosis present

## 2016-06-05 DIAGNOSIS — R531 Weakness: Secondary | ICD-10-CM | POA: Diagnosis present

## 2016-06-05 DIAGNOSIS — E1122 Type 2 diabetes mellitus with diabetic chronic kidney disease: Secondary | ICD-10-CM | POA: Diagnosis present

## 2016-06-05 DIAGNOSIS — I482 Chronic atrial fibrillation: Secondary | ICD-10-CM | POA: Diagnosis present

## 2016-06-05 DIAGNOSIS — I248 Other forms of acute ischemic heart disease: Secondary | ICD-10-CM | POA: Diagnosis present

## 2016-06-05 DIAGNOSIS — Z79899 Other long term (current) drug therapy: Secondary | ICD-10-CM

## 2016-06-05 DIAGNOSIS — I472 Ventricular tachycardia: Secondary | ICD-10-CM | POA: Diagnosis present

## 2016-06-05 DIAGNOSIS — I5043 Acute on chronic combined systolic (congestive) and diastolic (congestive) heart failure: Secondary | ICD-10-CM | POA: Diagnosis present

## 2016-06-05 DIAGNOSIS — E785 Hyperlipidemia, unspecified: Secondary | ICD-10-CM | POA: Diagnosis present

## 2016-06-05 DIAGNOSIS — I5033 Acute on chronic diastolic (congestive) heart failure: Secondary | ICD-10-CM | POA: Diagnosis present

## 2016-06-05 LAB — CBC
HEMATOCRIT: 45.4 % (ref 35.0–47.0)
HEMOGLOBIN: 14.7 g/dL (ref 12.0–16.0)
MCH: 25.7 pg — ABNORMAL LOW (ref 26.0–34.0)
MCHC: 32.3 g/dL (ref 32.0–36.0)
MCV: 79.7 fL — ABNORMAL LOW (ref 80.0–100.0)
Platelets: 175 10*3/uL (ref 150–440)
RBC: 5.7 MIL/uL — ABNORMAL HIGH (ref 3.80–5.20)
RDW: 18.2 % — AB (ref 11.5–14.5)
WBC: 8.1 10*3/uL (ref 3.6–11.0)

## 2016-06-05 LAB — BASIC METABOLIC PANEL
ANION GAP: 10 (ref 5–15)
BUN: 26 mg/dL — ABNORMAL HIGH (ref 6–20)
CALCIUM: 9.3 mg/dL (ref 8.9–10.3)
CO2: 25 mmol/L (ref 22–32)
Chloride: 102 mmol/L (ref 101–111)
Creatinine, Ser: 1.37 mg/dL — ABNORMAL HIGH (ref 0.44–1.00)
GFR, EST AFRICAN AMERICAN: 39 mL/min — AB (ref >60)
GFR, EST NON AFRICAN AMERICAN: 34 mL/min — AB (ref >60)
Glucose, Bld: 97 mg/dL (ref 65–99)
POTASSIUM: 4.3 mmol/L (ref 3.5–5.1)
SODIUM: 137 mmol/L (ref 135–145)

## 2016-06-05 LAB — TROPONIN I
TROPONIN I: 0.14 ng/mL — AB (ref <0.03)
TROPONIN I: 0.13 ng/mL — AB (ref <0.03)

## 2016-06-05 LAB — TSH: TSH: 2.08 u[IU]/mL (ref 0.350–4.500)

## 2016-06-05 MED ORDER — ENOXAPARIN SODIUM 40 MG/0.4ML ~~LOC~~ SOLN: 40.0000 mg | SUBCUTANEOUS | Status: DC

## 2016-06-05 MED ORDER — ONDANSETRON HCL 4 MG/2ML IJ SOLN
4.0000 mg | Freq: Four times a day (QID) | INTRAMUSCULAR | Status: DC | PRN
  Filled 2016-06-05: qty 2

## 2016-06-05 MED ORDER — ACETAMINOPHEN 650 MG RE SUPP: 650.0000 mg | Freq: Four times a day (QID) | RECTAL | Status: DC | PRN

## 2016-06-05 MED ORDER — OXYCODONE HCL 5 MG PO TABS: 5.0000 mg | ORAL_TABLET | ORAL | Status: DC | PRN

## 2016-06-05 MED ORDER — FUROSEMIDE 10 MG/ML IJ SOLN
20.0000 mg | Freq: Once | INTRAMUSCULAR | Status: AC
  Administered 2016-06-05: 20 mg via INTRAVENOUS
  Filled 2016-06-05: qty 4

## 2016-06-05 MED ORDER — CALCIUM CARB-CHOLECALCIFEROL 600-800 MG-UNIT PO TABS
1.0000 | ORAL_TABLET | Freq: Two times a day (BID) | ORAL | Status: DC
  Filled 2016-06-05: qty 1

## 2016-06-05 MED ORDER — LISINOPRIL 10 MG PO TABS
10.0000 mg | ORAL_TABLET | Freq: Every day | ORAL | Status: DC
  Administered 2016-06-05 – 2016-06-07 (×3): 10 mg via ORAL
  Filled 2016-06-05 (×3): qty 1

## 2016-06-05 MED ORDER — POLYETHYLENE GLYCOL 3350 17 G PO PACK: 17.0000 g | PACK | Freq: Every day | ORAL | Status: DC | PRN

## 2016-06-05 MED ORDER — POTASSIUM CITRATE ER 10 MEQ (1080 MG) PO TBCR
10.0000 meq | EXTENDED_RELEASE_TABLET | Freq: Two times a day (BID) | ORAL | Status: DC
  Administered 2016-06-05 – 2016-06-08 (×6): 10 meq via ORAL
  Filled 2016-06-05 (×6): qty 1

## 2016-06-05 MED ORDER — FUROSEMIDE 10 MG/ML IJ SOLN
20.0000 mg | Freq: Two times a day (BID) | INTRAMUSCULAR | Status: DC
  Administered 2016-06-05 – 2016-06-06 (×2): 20 mg via INTRAVENOUS
  Filled 2016-06-05 (×2): qty 2

## 2016-06-05 MED ORDER — ENOXAPARIN SODIUM 40 MG/0.4ML ~~LOC~~ SOLN
30.0000 mg | SUBCUTANEOUS | Status: DC
  Administered 2016-06-05 – 2016-06-07 (×3): 30 mg via SUBCUTANEOUS
  Filled 2016-06-05 (×3): qty 0.4

## 2016-06-05 MED ORDER — ACETAMINOPHEN 325 MG PO TABS: 650.0000 mg | ORAL_TABLET | Freq: Four times a day (QID) | ORAL | Status: DC | PRN

## 2016-06-05 MED ORDER — IPRATROPIUM-ALBUTEROL 0.5-2.5 (3) MG/3ML IN SOLN: 3.0000 mL | RESPIRATORY_TRACT | Status: DC | PRN

## 2016-06-05 MED ORDER — ONDANSETRON HCL 4 MG PO TABS: 4.0000 mg | ORAL_TABLET | Freq: Four times a day (QID) | ORAL | Status: DC | PRN

## 2016-06-05 MED ORDER — CALCIUM CARBONATE-VITAMIN D 500-200 MG-UNIT PO TABS
1.0000 | ORAL_TABLET | Freq: Two times a day (BID) | ORAL | Status: DC
  Administered 2016-06-05 – 2016-06-08 (×6): 1 via ORAL
  Filled 2016-06-05 (×6): qty 1

## 2016-06-05 MED ORDER — METOPROLOL TARTRATE 25 MG PO TABS
25.0000 mg | ORAL_TABLET | Freq: Two times a day (BID) | ORAL | Status: DC
  Administered 2016-06-05 – 2016-06-08 (×6): 25 mg via ORAL
  Filled 2016-06-05 (×6): qty 1

## 2016-06-05 MED ORDER — SODIUM CHLORIDE 0.9% FLUSH
3.0000 mL | Freq: Two times a day (BID) | INTRAVENOUS | Status: DC
  Administered 2016-06-05 – 2016-06-08 (×6): 3 mL via INTRAVENOUS

## 2016-06-05 MED ORDER — MORPHINE SULFATE (PF) 2 MG/ML IV SOLN: 2.0000 mg | INTRAVENOUS | Status: DC | PRN

## 2016-06-05 NOTE — ED Provider Notes (Signed)
[facility] Emergency Department Provider Note   ____________________________________________  Time seen: Approximately 320 PM  I have reviewed the triage vital signs and the nursing notes.   HISTORY  Chief Complaint Shortness of Breath and Leg Swelling   HPI [name] is a 82 y.o. female with a history of congestive heart failure who is presenting to the emergency department with 3 days of worsening shortness of breath as well as leg swelling. She has been compliant with her outpatient medications. She has had 2 recent admissions for a similar presentation. She went to urgent care earlier today and then was sent directly to the emergency department. She also discussed the case with her cardiologist's office who recommended she come immediately to the emergency department. The patient says that she is intermittent chest pressure but denies this at this time. Says that the shortness of breath worsens with exertion. Says that she has had weeping fluid from her legs as well with bilateral leg swelling.Denies any chest pain at this time.   Past Medical History  Diagnosis Date  . Diabetes mellitus     TYPE 2  . Hypertension   . Hyperlipidemia   . Osteoporosis   . GERD (gastroesophageal reflux disease)   . Chronic diastolic CHF (congestive heart failure) (HCC)   . BBB (bundle branch block)     CHRONIC  LEFT BBB  . CHF (congestive heart failure) (HCC)   . Alzheimer disease   . CKD (chronic kidney disease), stage III   . Atrial fibrillation (HCC)   . Coronary artery disease     Patient Active Problem List   Diagnosis Date Noted  . AKI (acute kidney injury) (HCC) 04/13/2016  . UTI (lower urinary tract infection) 04/13/2016  . Skin lesion on examination 08/06/2015  . Chronic diastolic CHF (congestive heart failure) (HCC) 05/11/2015  . Vitamin B 12 deficiency 05/11/2015  . Type 2 diabetes mellitus without complication (HCC)   . Hypertension   .  Hyperlipidemia   . Osteoporosis   . GERD (gastroesophageal reflux disease)   . BBB (bundle branch block)     Past Surgical History  Procedure Laterality Date  . Cholecystectomy    . Abdominal hysterectomy    . Colonoscopy w/ polypectomy  7/2OO8       05/2010    NEOPLASTIC  POLYPS  . Cabg x 5  08/10/2011    DR BARTLE  . Coronary artery bypass graft  2012    Current Outpatient Rx  Name  Route  Sig  Dispense  Refill  . acetaminophen (TYLENOL) 500 MG tablet   Oral   Take 500 mg by mouth 2 (two) times daily.         . Calcium Carb-Cholecalciferol (CALCIUM PLUS VITAMIN D3) 600-800 MG-UNIT TABS   Oral   Take 1 tablet by mouth 2 (two) times daily.         . furosemide (LASIX) 20 MG tablet   Oral   Take 20 mg by mouth 2 (two) times daily.         Marland Kitchen guaiFENesin (MUCINEX) 600 MG 12 hr tablet   Oral   Take 600 mg by mouth 2 (two) times daily.         Marland Kitchen guaiFENesin-dextromethorphan (ROBITUSSIN DM) 100-10 MG/5ML syrup   Oral   Take 5 mLs by mouth every 4 (four) hours as needed for cough.   118 mL   0   . lisinopril (PRINIVIL,ZESTRIL) 5 MG tablet  Oral   Take 2 tablets (10 mg total) by mouth at bedtime.   30 tablet   0   . metoprolol tartrate (LOPRESSOR) 25 MG tablet   Oral   Take 1 tablet (25 mg total) by mouth 2 (two) times daily.   60 tablet   2   . polyethylene glycol (MIRALAX / GLYCOLAX) packet   Oral   Take 17 g by mouth daily as needed.          . potassium citrate (UROCIT-K) 10 MEQ (1080 MG) SR tablet   Oral   Take 10 mEq by mouth 2 (two) times daily.            Allergies Risedronate  Family History  Problem Relation Age of Onset  . Hypertension Mother   . Heart disease Father     Social History Social History  Substance Use Topics  . Smoking status: Former Games developer  . Smokeless tobacco: Former Neurosurgeon    Quit date: 11/27/1980  . Alcohol Use: No    Review of Systems Constitutional: No fever/chills Eyes: No visual changes. ENT: No sore  throat. Cardiovascular: As above Respiratory: As above Gastrointestinal: No abdominal pain.  No nausea, no vomiting.  No diarrhea.  No constipation. Genitourinary: Negative for dysuria. Musculoskeletal: Negative for back pain. Skin: Negative for rash. Neurological: Negative for headaches, focal weakness or numbness.  10-point ROS otherwise negative.  ____________________________________________   PHYSICAL EXAM:  VITAL SIGNS: ED Triage Vitals  Enc Vitals Group     BP 06/05/16 1418 109/72 mmHg     Pulse Rate 06/05/16 1418 85     Resp 06/05/16 1418 20     Temp 06/05/16 1418 97.5 F (36.4 C)     Temp Source 06/05/16 1418 Axillary     SpO2 06/05/16 1418 96 %     Weight 06/05/16 1418 170 lb (77.111 kg)     Height 06/05/16 1418  (1.575 m)     Head Cir --      Peak Flow --      Pain Score 06/05/16 1521 5     Pain Loc --      Pain Edu? --      Excl. in GC? --     Constitutional: Alert and oriented. Well appearing and in no acute distress. Eyes: Conjunctivae are normal. PERRL. EOMI. Head: Atraumatic. Nose: No congestion/rhinnorhea. Mouth/Throat: Mucous membranes are moist.   Neck: No stridor.   Cardiovascular: Irregularly irregular. Grossly normal heart sounds.   Respiratory: Normal respiratory effort.  No retractions. Lungs CTAB. Gastrointestinal: Soft and nontender. No CVA tenderness. Musculoskeletal: Bilateral moderate lower extremity edema without any obvious weeping fluid at this time. Neurologic:  Normal speech and language. No gross focal neurologic deficits are appreciated. Skin: Left lower extremity with lateral prominent capillaries. Negative for induration or pus. No tenderness to palpation. Psychiatric: Mood and affect are normal. Speech and behavior are normal.  ____________________________________________   LABS (all labs ordered are listed, but only abnormal results are displayed)  Labs Reviewed  BASIC METABOLIC PANEL - Abnormal; Notable for the  following:    BUN 26 (*)    Creatinine, Ser 1.37 (*)    GFR calc non Af Amer 34 (*)    GFR calc Af Amer 39 (*)    All other components within normal limits  CBC - Abnormal; Notable for the following:    RBC 5.70 (*)    MCV 79.7 (*)    MCH 25.7 (*)  RDW 18.2 (*)    All other components within normal limits  TROPONIN I - Abnormal; Notable for the following:    Troponin I 0.14 (*)    All other components within normal limits   ____________________________________________  EKG  ED ECG REPORT I, Schaevitz,  Teena Iraniavid M, the attending physician, personally viewed and interpreted this ECG.   Date: 06/05/2016  EKG Time: 1428  Rate: 97  Rhythm: atrial fibrillation, rate 97  Axis: Normal  Intervals:left bundle branch block  ST&T Change: T wave inversions in 1, 2, aVL as well as V6. EKG is consistent with previous EKGs with similar T-wave inversions. No ST elevations or depressions which appear to indicate a STEMI in the context of the left bundle branch block.  ____________________________________________  RADIOLOGY  DG Chest 2 View (Final result) Result time: 06/05/16 14:58:09   Final result by Rad Results In Interface (06/05/16 14:58:09)   Narrative:   CLINICAL DATA: Weeping edema bilateral lower extremities. Shortness of breath for 3 days.  EXAM: CHEST 2 VIEW  COMPARISON: PA and lateral chest 04/25/2016 and 03/22/2016.  FINDINGS: There is marked cardiomegaly with pulmonary vascular congestion. The patient has a new small right pleural effusion with basilar atelectasis. The left lung is clear. Atherosclerotic vascular disease is noted. No pneumothorax.  IMPRESSION: Cardiomegaly and pulmonary vascular congestion.  New small right pleural effusion and basilar atelectasis.  Atherosclerosis.   Electronically Signed By: Drusilla Kannerhomas Dalessio M.D. On: 06/05/2016 14:58     ____________________________________________   PROCEDURES   Procedures  ____________________________________________   INITIAL IMPRESSION / ASSESSMENT AND PLAN / ED COURSE  Pertinent labs & imaging results that were available during my care of the patient were reviewed by me and considered in my medical decision making (see chart for details).  ----------------------------------------- 4:04 PM on 06/05/2016 -----------------------------------------  Discussed the case with Dr. Gwen PoundsKowalski of cardiology who recommends Lasix at this time. Patient with blood pressure in the 100 systolic. We'll give 20 mg IV Lasix at this time. He does not recommend any other medications at this time and says that he or one of his associates will see the patient is a consult. Signed out to Dr. Clint GuyHower for [facility] to the hospital. The patient as well as the family were updated about the need for admission to the hospital and her understanding and are willing to comply with this plan. Dr. Gwen PoundsKowalski did not recommend heparin at this time. Likely slight troponin bump secondary to strain from heart failure. ____________________________________________   FINAL CLINICAL IMPRESSION(S) / ED DIAGNOSES  Congestive heart failure.    NEW MEDICATIONS STARTED DURING THIS VISIT:  New Prescriptions   No medications on file     Note:  This document was prepared using Dragon voice recognition software and may include unintentional dictation errors.    Myrna Blazeravid Matthew Schaevitz, MD 06/05/16 [id number]

## 2016-06-05 NOTE — ED Notes (Signed)
Pt reports that she went to the walk in clinic today for "fluid in her legs" - The provider also told them that she had "fluid in the bottom of her lungs" - Pt states it feels like she cannot get her breath at time - Pt says that it feels like something is sitting on her chest - MD at bedside

## 2016-06-05 NOTE — ED Notes (Signed)
Lab called and notified nurse of troponin of 0.14 - notified Dr Cyril LoosenKinner at this time

## 2016-06-05 NOTE — H&P (Addendum)
[facility] [place] [facility]   PATIENT NAME: [name]    MR#:  [id number]  DATE OF BIRTH:  Oct 19, 1926   DATE OF ADMISSION:  06/05/2016  PRIMARY CARE PHYSICIAN: [facility]   REQUESTING/REFERRING PHYSICIAN: Pershing Proud  CHIEF COMPLAINT:   Chief Complaint  Patient presents with  . Shortness of Breath  . Leg Swelling    HISTORY OF PRESENT ILLNESS:  [name]  is a 82 y.o. female with a known history of Diastolic congestive heart failure who is presenting with lower extremity swelling she currently resides at [facility] they have noticed increased swelling in her legs to the point today she experienced weeping edema. Went to an urgent care she became short of breath on upon ambulating and maneuvering out of the vehicle she was then subsequently sent to the Hospital further workup and evaluation. Positive orthopnea She states good medical compliance but poor dietary compliance denies chest pain further symptomatology.  PAST MEDICAL HISTORY:   Past Medical History  Diagnosis Date  . Diabetes mellitus     TYPE 2  . Hypertension   . Hyperlipidemia   . Osteoporosis   . GERD (gastroesophageal reflux disease)   . Chronic diastolic CHF (congestive heart failure) (HCC)   . BBB (bundle branch block)     CHRONIC  LEFT BBB  . CHF (congestive heart failure) (HCC)   . Alzheimer disease   . CKD (chronic kidney disease), stage III   . Atrial fibrillation (HCC)   . Coronary artery disease     PAST SURGICAL HISTORY:   Past Surgical History  Procedure Laterality Date  . Cholecystectomy    . Abdominal hysterectomy    . Colonoscopy w/ polypectomy  7/2OO8       05/2010    NEOPLASTIC  POLYPS  . Cabg x 5  08/10/2011    DR BARTLE  . Coronary artery bypass graft  2012    SOCIAL HISTORY:   Social History  Substance Use Topics  . Smoking status: Former Games developer  . Smokeless tobacco: Former Neurosurgeon    Quit date: 11/27/1980    . Alcohol Use: No    FAMILY HISTORY:   Family History  Problem Relation Age of Onset  . Hypertension Mother   . Heart disease Father     DRUG ALLERGIES:   Allergies  Allergen Reactions  . Risedronate Rash    REVIEW OF SYSTEMS:  REVIEW OF SYSTEMS:  CONSTITUTIONAL: Denies fevers, chills, fatigue, weakness.  EYES: Denies blurred vision, double vision, or eye pain.  EARS, NOSE, THROAT: Denies tinnitus, ear pain, hearing loss.  RESPIRATORY: denies cough, Positive shortness of breath, denies wheezing  CARDIOVASCULAR: Denies chest pain, palpitations, positive edema.  GASTROINTESTINAL: Denies nausea, vomiting, diarrhea, abdominal pain.  GENITOURINARY: Denies dysuria, hematuria.  ENDOCRINE: Denies nocturia or thyroid problems. HEMATOLOGIC AND LYMPHATIC: Denies easy bruising or bleeding.  SKIN: Denies rash or lesions.  MUSCULOSKELETAL: Denies pain in neck, back, shoulder, knees, hips, or further arthritic symptoms.  NEUROLOGIC: Denies paralysis, paresthesias.  PSYCHIATRIC: Denies anxiety or depressive symptoms. Otherwise full review of systems performed by me is negative.   MEDICATIONS AT HOME:   Prior to Admission medications   Medication Sig Start Date End Date Taking? Authorizing Provider  acetaminophen (TYLENOL) 500 MG tablet Take 500 mg by mouth 2 (two) times daily.   Yes Historical Provider, MD  Calcium Carb-Cholecalciferol (CALCIUM PLUS VITAMIN D3) 600-800 MG-UNIT TABS Take 1 tablet by mouth 2 (two) times  daily.   Yes Historical Provider, MD  furosemide (LASIX) 20 MG tablet Take 20 mg by mouth 2 (two) times daily.   Yes Historical Provider, MD  guaiFENesin (MUCINEX) 600 MG 12 hr tablet Take 600 mg by mouth 2 (two) times daily.   Yes Historical Provider, MD  guaiFENesin-dextromethorphan (ROBITUSSIN DM) 100-10 MG/5ML syrup Take 5 mLs by mouth every 4 (four) hours as needed for cough. 04/19/16  Yes Houston Siren, MD  lisinopril (PRINIVIL,ZESTRIL) 5 MG tablet Take 2 tablets  (10 mg total) by mouth at bedtime. 04/27/16  Yes Enedina Finner, MD  metoprolol tartrate (LOPRESSOR) 25 MG tablet Take 1 tablet (25 mg total) by mouth 2 (two) times daily. 04/27/16  Yes Enedina Finner, MD  polyethylene glycol (MIRALAX / GLYCOLAX) packet Take 17 g by mouth daily as needed.    Yes Historical Provider, MD  potassium citrate (UROCIT-K) 10 MEQ (1080 MG) SR tablet Take 10 mEq by mouth 2 (two) times daily.    Yes Historical Provider, MD      VITAL SIGNS:  Blood pressure 102/66, pulse 95, temperature 97.5 F (36.4 C), temperature source Axillary, resp. rate 18, height 5\' 2"  (1.575 m), weight 170 lb (77.111 kg), SpO2 97 %.  PHYSICAL EXAMINATION:  VITAL SIGNS: Filed Vitals:   06/05/16 1418 06/05/16 1522  BP: 109/72 102/66  Pulse: 85 95  Temp: 97.5 F (36.4 C)   Resp: 20 18   GENERAL:82 y.o.female currently in no acute distress.  HEAD: Normocephalic, atraumatic.  EYES: Pupils equal, round, reactive to light. Extraocular muscles intact. No scleral icterus.  MOUTH: Moist mucosal membrane. Dentition intact. No abscess noted.  EAR, NOSE, THROAT: Clear without exudates. No external lesions.  NECK: Supple. No thyromegaly. No nodules. No JVD.  PULMONARY: Clear to ascultation, without wheeze rails or rhonci. No use of accessory muscles, Good respiratory effort. good air entry bilaterally CHEST: Nontender to palpation.  CARDIOVASCULAR: S1 and S2. Irregular rate and rhythm. No murmurs, rubs, or gallops. 3-4+ edema. Pedal pulses 2+ bilaterally.  GASTROINTESTINAL: Soft, nontender, nondistended. No masses. Positive bowel sounds. No hepatosplenomegaly.  MUSCULOSKELETAL: No swelling, clubbing, or edema. Range of motion full in all extremities.  NEUROLOGIC: Cranial nerves II through XII are intact. No gross focal neurological deficits. Sensation intact. Reflexes intact.  SKIN: No ulceration, lesions, rashes, or cyanosis. Skin warm and dry. Turgor intact.  PSYCHIATRIC: Mood, affect within normal limits.  The patient is awake, alert and oriented x 3. Insight, judgment intact.    LABORATORY PANEL:   CBC  Recent Labs Lab 06/05/16 1432  WBC 8.1  HGB 14.7  HCT 45.4  PLT 175   ------------------------------------------------------------------------------------------------------------------  Chemistries   Recent Labs Lab 06/05/16 1432  NA 137  K 4.3  CL 102  CO2 25  GLUCOSE 97  BUN 26*  CREATININE 1.37*  CALCIUM 9.3   ------------------------------------------------------------------------------------------------------------------  Cardiac Enzymes  Recent Labs Lab 06/05/16 1432  TROPONINI 0.14*   ------------------------------------------------------------------------------------------------------------------  RADIOLOGY:  Dg Chest 2 View  06/05/2016  CLINICAL DATA:  Weeping edema bilateral lower extremities. Shortness of breath for 3 days. EXAM: CHEST  2 VIEW COMPARISON:  PA and lateral chest 04/25/2016 and 03/22/2016. FINDINGS: There is marked cardiomegaly with pulmonary vascular congestion. The patient has a new small right pleural effusion with basilar atelectasis. The left lung is clear. Atherosclerotic vascular disease is noted. No pneumothorax. IMPRESSION: Cardiomegaly and pulmonary vascular congestion. New small right pleural effusion and basilar atelectasis. Atherosclerosis. Electronically Signed   By: Drusilla Kanner M.D.  On: 06/05/2016 14:58    EKG:   Orders placed or performed during the hospital encounter of 06/05/16  . ED EKG within 10 minutes  . ED EKG within 10 minutes  . EKG 12-Lead  . EKG 12-Lead    IMPRESSION AND PLAN:   82 year old Caucasian female history of diastolic congestive heart failure presenting with lower extremity edema.  1. Acute on chronic diastolic congestive heart failure: Telemetry, IV diuresis follow ins and outs, daily weights, breathing treatments, cardiology consult [facility] 2. Essential hypertension  lisinopril Lopressor 3.Elevated troponin suspect demand related, trend cardiac enzymes    All the records are reviewed and case discussed with ED provider. Management plans discussed with the patient, family and they are in agreement.  CODE STATUS: Full  TOTAL TIME TAKING CARE OF THIS PATIENT: 33 minutes.    Hower,  Mardi MainlandDavid K M.D on 06/05/2016 at 5:00 PM  Between 7am to 6pm - Pager - [phone]  After 6pm: House Pager: - [phone]  [facility]  Office  [phone]  CC: Primary care physician; [facility]

## 2016-06-05 NOTE — ED Notes (Signed)
Pt presents under directions from cardiologist's office. Pt has a followup appt with dr. Lady Garyfath tomorrow but went to urgent care today because she has weeping edema to her legs and they determined she has fluid in her lungs. Pt states she has some sob x 3 days. Pt alert & oriented with NAD noted.

## 2016-06-05 NOTE — Consult Note (Signed)
[facility] Consultation [facility]  Patient ID: [name], MRN: [id number], DOB/AGE: 03-Jun-1926 82 y.o. Admit date: 06/05/2016   Date of Consult: 06/05/2016 Primary Physician: [facility] Primary Cardiologist: Lady GaryFath  Chief Complaint:  Chief Complaint  Patient presents with  . Shortness of Breath  . Leg Swelling   Reason for Consult: acute on chronic diastolic dysfunction congestive heart failure with elevated troponin  HPI: 82 y.o. female with known diastolic dysfunction congestive heart failure chronic left bundle-branch block chronic atrial fibrillation with acute on chronic diastolic dysfunction heart failure with lower extremity edema on appropriate medication management and no previous history of significant coronary artery disease or myocardial infarction. The patient has had other exacerbating issues included chronic kidney disease stage III atrial fibrillation with controlled ventricular rate and apparently coronary artery disease although no history of myocardial infarction. The patient was admitted for worsening lower extremity edema pulmonary edema and shortness of breath most consistent with dietary indiscretion rather than inappropriate use of medication management were new concerns. Patient does have the known atrial fibrillation but does not have any anticoagulation at this time due to concerns of fall risk and Alzheimer's.  Past Medical History  Diagnosis Date  . Diabetes mellitus     TYPE 2  . Hypertension   . Hyperlipidemia   . Osteoporosis   . GERD (gastroesophageal reflux disease)   . Chronic diastolic CHF (congestive heart failure) (HCC)   . BBB (bundle branch block)     CHRONIC  LEFT BBB  . CHF (congestive heart failure) (HCC)   . Alzheimer disease   . CKD (chronic kidney disease), stage III   . Atrial fibrillation (HCC)   . Coronary artery disease       Surgical History:  Past Surgical History  Procedure Laterality Date  .  Cholecystectomy    . Abdominal hysterectomy    . Colonoscopy w/ polypectomy  7/2OO8       05/2010    NEOPLASTIC  POLYPS  . Cabg x 5  08/10/2011    DR BARTLE  . Coronary artery bypass graft  2012     Home Meds: Prior to Admission medications   Medication Sig Start Date End Date Taking? Authorizing Provider  acetaminophen (TYLENOL) 500 MG tablet Take 500 mg by mouth 2 (two) times daily.   Yes Historical Provider, MD  Calcium Carb-Cholecalciferol (CALCIUM PLUS VITAMIN D3) 600-800 MG-UNIT TABS Take 1 tablet by mouth 2 (two) times daily.   Yes Historical Provider, MD  furosemide (LASIX) 20 MG tablet Take 20 mg by mouth 2 (two) times daily.   Yes Historical Provider, MD  guaiFENesin (MUCINEX) 600 MG 12 hr tablet Take 600 mg by mouth 2 (two) times daily.   Yes Historical Provider, MD  guaiFENesin-dextromethorphan (ROBITUSSIN DM) 100-10 MG/5ML syrup Take 5 mLs by mouth every 4 (four) hours as needed for cough. 04/19/16  Yes Houston SirenVivek J Sainani, MD  lisinopril (PRINIVIL,ZESTRIL) 5 MG tablet Take 2 tablets (10 mg total) by mouth at bedtime. 04/27/16  Yes Enedina FinnerSona Patel, MD  metoprolol tartrate (LOPRESSOR) 25 MG tablet Take 1 tablet (25 mg total) by mouth 2 (two) times daily. 04/27/16  Yes Enedina FinnerSona Patel, MD  polyethylene glycol (MIRALAX / GLYCOLAX) packet Take 17 g by mouth daily as needed.    Yes Historical Provider, MD  potassium citrate (UROCIT-K) 10 MEQ (1080 MG) SR tablet Take 10 mEq by mouth 2 (two) times daily.    Yes Historical Provider, MD    Inpatient Medications:  .  calcium-vitamin D  1 tablet Oral BID  . enoxaparin (LOVENOX) injection  30 mg Subcutaneous Q24H  . furosemide  20 mg Intravenous BID  . lisinopril  10 mg Oral QHS  . metoprolol tartrate  25 mg Oral BID  . potassium citrate  10 mEq Oral BID  . sodium chloride flush  3 mL Intravenous Q12H      Allergies:  Allergies  Allergen Reactions  . Risedronate Rash    Social History   Social History  . Marital Status: Single    Spouse Name:  N/A  . Number of Children: N/A  . Years of Education: N/A   Occupational History  . Not on file.   Social History Main Topics  . Smoking status: Former Games developer  . Smokeless tobacco: Former Neurosurgeon    Quit date: 11/27/1980  . Alcohol Use: No  . Drug Use: No  . Sexual Activity: Not on file   Other Topics Concern  . Not on file   Social History Narrative     Family History  Problem Relation Age of Onset  . Hypertension Mother   . Heart disease Father      Review of Systems Positive for Shortness of breath Negative for: General:  chills, fever, night sweats or weight changes.  Cardiovascular: PND orthopnea syncope dizziness  Dermatological skin lesions rashes Respiratory: Cough congestion Urologic: Frequent urination urination at night and hematuria Abdominal: negative for nausea, vomiting, diarrhea, bright red blood per rectum, melena, or hematemesis Neurologic: negative for visual changes, and/or hearing changes  All other systems reviewed and are otherwise negative except as noted above.  Labs:  Recent Labs  06/05/16 1432  TROPONINI 0.14*   Lab Results  Component Value Date   WBC 8.1 06/05/2016   HGB 14.7 06/05/2016   HCT 45.4 06/05/2016   MCV 79.7* 06/05/2016   PLT 175 06/05/2016    Recent Labs Lab 06/05/16 1432  NA 137  K 4.3  CL 102  CO2 25  BUN 26*  CREATININE 1.37*  CALCIUM 9.3  GLUCOSE 97   Lab Results  Component Value Date   CHOL 149 05/11/2015   HDL 45 05/11/2015   LDLCALC 70 05/11/2015   TRIG 170* 05/11/2015   Lab Results  Component Value Date   DDIMER 0.56* 07/30/2011    Radiology/Studies:  Dg Chest 2 View  06/05/2016  CLINICAL DATA:  Weeping edema bilateral lower extremities. Shortness of breath for 3 days. EXAM: CHEST  2 VIEW COMPARISON:  PA and lateral chest 04/25/2016 and 03/22/2016. FINDINGS: There is marked cardiomegaly with pulmonary vascular congestion. The patient has a new small right pleural effusion with basilar  atelectasis. The left lung is clear. Atherosclerotic vascular disease is noted. No pneumothorax. IMPRESSION: Cardiomegaly and pulmonary vascular congestion. New small right pleural effusion and basilar atelectasis. Atherosclerosis. Electronically Signed   By: Drusilla Kanner M.D.   On: 06/05/2016 14:58    EKG: Atrial fibrillation with controlled ventricular rate and left bundle branch block  Weights: Filed Weights   06/05/16 1418 06/05/16 1736  Weight: 170 lb (77.111 kg) 173 lb 14.4 oz (78.881 kg)     Physical Exam: Blood pressure 97/75, pulse 88, temperature 96.8 F (36 C), temperature source Axillary, resp. rate 19, height 5\' 2"  (1.575 m), weight 173 lb 14.4 oz (78.881 kg), SpO2 93 %. Body mass index is 31.8 kg/(m^2). General: Well developed, well nourished, in no acute distress. Head eyes ears nose throat: Normocephalic, atraumatic, sclera non-icteric, no xanthomas, nares are without  discharge. No apparent thyromegaly and/or mass  Lungs: Normal respiratory effort.  no wheezesBasilar rales, no rhonchi.  Heart: Irregular with normal S1 S2. no murmur gallop, no rub, PMI is normal size and placement, carotid upstroke normal without bruit, jugular venous pressure is normal Abdomen: Soft, non-tender, non-distended with normoactive bowel sounds. No hepatomegaly. No rebound/guarding. No obvious abdominal masses. Abdominal aorta is normal size without bruit Extremities: 2+ edema. no cyanosis, no clubbing, no ulcers  Peripheral : 2+ bilateral upper extremity pulses, 2+ bilateral femoral pulses, 2+ bilateral dorsal pedal pulse Neuro: Not Alert and oriented. No facial asymmetry. No focal deficit. Moves all extremities spontaneously. Musculoskeletal: Normal muscle tone without kyphosis Psych:  Does not Responds to questions appropriately with a normal affect.    Assessment: 82 year old female with acute on chronic diastolic dysfunction congestive heart failure likely secondary to dietary  indiscretion on appropriate medication management with concerns that her current living facility is not appropriately watching her sodium intake. The patient does have an elevated troponin possibly consistent with demand ischemia and possibly hypoxia with the chronic nonvalvular atrial fibrillation with good Heart rate control on appropriate medication management without anticoagulation due to fall risk  Plan: 1. Intravenous Lasix for lower extremity and pulmonary edema and acute on chronic diastolic dysfunction heart failure 2. No further intervention of chronic left bundle-branch block 3. Atrial fibrillation heart rate control with metoprolol 4. No anticoagulation due to concerns of fall risk and anemia 5. Further congestive heart failure clinic treatment as well as interdisciplinary treatment for low sodium diet  Signed, Lamar Blinks M.D. [facility] Cardiology 06/05/2016, 6:33 PM

## 2016-06-06 ENCOUNTER — Ambulatory Visit: Payer: Medicare Other | Admitting: Family

## 2016-06-06 ENCOUNTER — Inpatient Hospital Stay
Admit: 2016-06-06 | Discharge: 2016-06-06 | Disposition: A | Discharge status: Home / Self Care | Payer: Medicare Other | Attending: Internal Medicine | Admitting: Internal Medicine

## 2016-06-06 LAB — TROPONIN I
TROPONIN I: 0.13 ng/mL — AB (ref <0.03)
Troponin I: 0.13 ng/mL (ref <0.03)

## 2016-06-06 LAB — BASIC METABOLIC PANEL
Anion gap: 7 (ref 5–15)
BUN: 25 mg/dL — AB (ref 6–20)
CALCIUM: 9 mg/dL (ref 8.9–10.3)
CHLORIDE: 102 mmol/L (ref 101–111)
CO2: 28 mmol/L (ref 22–32)
CREATININE: 1.5 mg/dL — AB (ref 0.44–1.00)
GFR, EST AFRICAN AMERICAN: 35 mL/min — AB (ref >60)
GFR, EST NON AFRICAN AMERICAN: 30 mL/min — AB (ref >60)
Glucose, Bld: 117 mg/dL — ABNORMAL HIGH (ref 65–99)
Potassium: 4.5 mmol/L (ref 3.5–5.1)
SODIUM: 137 mmol/L (ref 135–145)

## 2016-06-06 LAB — CBC
HCT: 42.1 % (ref 35.0–47.0)
Hemoglobin: 13.5 g/dL (ref 12.0–16.0)
MCH: 25.7 pg — ABNORMAL LOW (ref 26.0–34.0)
MCHC: 32.1 g/dL (ref 32.0–36.0)
MCV: 79.9 fL — ABNORMAL LOW (ref 80.0–100.0)
PLATELETS: 157 10*3/uL (ref 150–440)
RBC: 5.27 MIL/uL — AB (ref 3.80–5.20)
RDW: 18.1 % — AB (ref 11.5–14.5)
WBC: 6.6 10*3/uL (ref 3.6–11.0)

## 2016-06-06 LAB — ECHOCARDIOGRAM COMPLETE
Height: 62 in
WEIGHTICAEL: 2750.4 [oz_av]

## 2016-06-06 MED ORDER — FUROSEMIDE 10 MG/ML IJ SOLN
40.0000 mg | Freq: Two times a day (BID) | INTRAMUSCULAR | Status: DC
  Administered 2016-06-06 – 2016-06-08 (×4): 40 mg via INTRAVENOUS
  Filled 2016-06-06 (×4): qty 4

## 2016-06-06 NOTE — Care Management (Signed)
Admitted with shortness of breath and leg swelling, CHF. From [facility]. CSW aware.

## 2016-06-06 NOTE — Progress Notes (Signed)
*  PRELIMINARY RESULTS* Echocardiogram 2D Echocardiogram has been performed.  Cristela BlueHege, Libia Fazzini 06/06/2016, 8:31 AM

## 2016-06-06 NOTE — Progress Notes (Addendum)
Patient had a 7 bt run of Vtach. VSS  and asymptomatic. Dr Sherryll BurgerShah and Dr Gwen PoundsKowalski notified. No orders received

## 2016-06-06 NOTE — Progress Notes (Signed)
Patient ID: [name], female   DOB: Jan 04, 1926, 82 y.o.   MRN: [id number] [facility] [place] [facility]   PATIENT NAME: [name]    MR#:  [id number]  DATE OF BIRTH:  Jan 04, 1926  SUBJECTIVE:  Worsening LE swelling over last few weeks, had 7-8 beats of NSVT, (asymptomatic), her SOB is getting worse. REVIEW OF SYSTEMS:   Review of Systems  Constitutional: Negative for fever, chills and weight loss.  HENT: Negative for ear discharge, ear pain and nosebleeds.   Eyes: Negative for blurred vision, pain and discharge.  Respiratory: Positive for shortness of breath. Negative for sputum production, wheezing and stridor.   Cardiovascular: Positive for leg swelling. Negative for chest pain, palpitations, orthopnea and PND.  Gastrointestinal: Negative for nausea, vomiting, abdominal pain and diarrhea.  Genitourinary: Negative for urgency and frequency.  Musculoskeletal: Negative for back pain and joint pain.  Neurological: Negative for sensory change, speech change, focal weakness and weakness.  Psychiatric/Behavioral: Negative for depression and hallucinations. The patient is not nervous/anxious.    Tolerating Diet:yes  DRUG ALLERGIES:   Allergies  Allergen Reactions  . Risedronate Rash    VITALS:  Blood pressure 109/71, pulse 81, temperature 97.7 F (36.5 C), temperature source Oral, resp. rate 18, height 5\' 2"  (1.575 m), weight 77.973 kg (171 lb 14.4 oz), SpO2 92 %.  PHYSICAL EXAMINATION:   Physical Exam  GENERAL:  82 y.o.-year-old patient lying in the bed with no acute distress.  EYES: Pupils equal, round, reactive to light and accommodation. No scleral icterus. Extraocular muscles intact.  HEENT: Head atraumatic, normocephalic. Oropharynx and nasopharynx clear.  NECK:  Supple, no jugular venous distention. No thyroid enlargement, no tenderness.  LUNGS: Normal breath sounds bilaterally, no wheezing, rales, rhonchi. No use of accessory  muscles of respiration.  CARDIOVASCULAR: S1, S2 normal. No murmurs, rubs, or gallops.  ABDOMEN: Soft, nontender, nondistended. Bowel sounds present. No organomegaly or mass.  EXTREMITIES: No cyanosis, clubbing , ++ edema b/l.    NEUROLOGIC: Cranial nerves II through XII are intact. No focal Motor or sensory deficits b/l.   PSYCHIATRIC:  patient is alert and oriented x 3.  SKIN: No obvious rash, lesion, or ulcer.  LABORATORY PANEL:  CBC  Recent Labs Lab 06/06/16 0552  WBC 6.6  HGB 13.5  HCT 42.1  PLT 157    Chemistries   Recent Labs Lab 06/06/16 0552  NA 137  K 4.5  CL 102  CO2 28  GLUCOSE 117*  BUN 25*  CREATININE 1.50*  CALCIUM 9.0   Cardiac Enzymes  Recent Labs Lab 06/06/16 0552  TROPONINI 0.13*   RADIOLOGY:  Dg Chest 2 View  06/05/2016  CLINICAL DATA:  Weeping edema bilateral lower extremities. Shortness of breath for 3 days. EXAM: CHEST  2 VIEW COMPARISON:  PA and lateral chest 04/25/2016 and 03/22/2016. FINDINGS: There is marked cardiomegaly with pulmonary vascular congestion. The patient has a new small right pleural effusion with basilar atelectasis. The left lung is clear. Atherosclerotic vascular disease is noted. No pneumothorax. IMPRESSION: Cardiomegaly and pulmonary vascular congestion. New small right pleural effusion and basilar atelectasis. Atherosclerosis. Electronically Signed   By: Drusilla Kannerhomas  Dalessio M.D.   On: 06/05/2016 14:58   ASSESSMENT AND PLAN:  [name] is a 82 y.o. female with a known history of Chronic diastolic congestive heart failure, GERD, hypertension, hyperlipidemia, Alzheimer's disease comes in from [facility] assisted [facility] with increasing shortness of breath and weight gain of about 7-9 pounds and leg edema.  1. Acute on chronic congestive heart failure Systolic - echo on this admission shows EF of 20-25% (but previous records shows 50-55%), if real, then may need further w/up - Increase dose of IV Lasix 40 mg twice a day  monitor I's and O's monitor creatinine---UOP 1050 cc - daily wt, strict I & Os -Cardiology seen y'day and waiting for f/up today  2. Acute on Chronic kidney disease stage III Baseline creatinine around 1.3, now 1.5 - monitor while on lasix, if worsens, may need to stop  3. Generalized weakness. PT, OT eval  4. Alzheimer's dementia Patient is from [facility] assisted [facility]   5. Elevated troponin suspect demand related, monitor on tele   Case discussed with Care Management/Social Worker. Management plans discussed with the patient, family and they are in agreement.    CODE STATUS: full   TOTAL TIME TAKING CARE OF THIS PATIENT: 30 minutes.  >50% time spent on counselling and coordination of care  POSSIBLE D/C IN 2-3 DAYS, DEPENDING ON CLINICAL CONDITION and cardio eval  Note: This dictation was prepared with Dragon dictation along with smaller phrase technology. Any transcriptional errors that result from this process are unintentional.  [facility], Tayo Maute M.D on 06/06/2016 at 3:50 PM  Between 7am to 6pm - Pager - [phone]  After 6pm go to [email or web address] - password [email or web address] [facility]  [facility]  Office  [phone]  CC: Primary care physician; [facility]

## 2016-06-06 NOTE — Clinical Social Work Note (Addendum)
Clinical Social Work Assessment  Patient Details  Name: [name] MRN: [id number] Date of Birth: Mar 24, 1926  Date of referral:  06/06/16               Reason for consult:  Discharge Planning                Permission sought to share information with:  Family Supports Permission granted to share information::  Yes, Verbal Permission Granted  Name::        Agency::     Relationship::   Enid Derry- Daughter )  Contact Information:     Housing/Transportation Living arrangements for the past 2 months:  [place] ([facility]) Source of Information:  Patient, Adult Children Patient Interpreter Needed:  None Criminal Activity/Legal Involvement Pertinent to Current Situation/Hospitalization:  No - Comment as needed Significant Relationships:  Adult Children, Other Family Members Lives with:  Facility Resident ([facility]) Do you feel safe going back to the place where you live?  Yes Need for family participation in patient care:  Yes (Comment) (Daughter)  Care giving concerns:  Patient is a resident at [facility]. Patient's family is concerned that [facility] is not following patient's diet. Reported they feel like this has caused patient to have a lot of hospitalizations.    Social Worker assessment / plan:  CSW is familiar with patient due to frequent hospitalizations. CSW met with patient, her daughter and son-in-law at bedside. Patient's daughter reports that nothing has changed at [facility]. She reports that patient is still not eating the correct diet and that she's frustrated. CSW suggested that she has a meeting with Thayer Headings- Admissions Coordinator at [facility] to voice her concerns. Patient's daughter reported she'd like to. Granted CSW verbal permission to coordinate discharge plans with [facility]. Agreeable for patient to return at discharge. CSW left a Advertising account executive for [place]. Awaiting phone call back. FL2 completed but has not been placed on the chart  until discharge due to CSW having to manually input patient' discharge medications before MD signature. CSW will continue to follow and assist.   Employment status:  Retired Nurse, adult PT Recommendations:  Not assessed at this time Information / Referral to community resources:     Patient/Family's Response to care:  Patient's family isn't pleased with the care she's received at [facility]. Reported they'd like a meeting with [facility]. Stated they appreciates CSW's assistance.   Patient/Family's Understanding of and Emotional Response to Diagnosis, Current Treatment, and Prognosis:  Patient's family reportst hey understand patient's diagnosis, current treatment ad prognosis at this time.   Emotional Assessment Appearance:  Appears stated age Attitude/Demeanor/Rapport:   (None) Affect (typically observed):  Calm, Pleasant Orientation:  Oriented to Self Alcohol / Substance use:  Not Applicable Psych involvement (Current and /or in the community):  No (Comment)  Discharge Needs  Concerns to be addressed:  Discharge Planning Concerns Readmission within the last 30 days:  No Current discharge risk:  Chronically ill Barriers to Discharge:  Continued Medical Work up   [employer], LCSW 06/06/2016, 2:29 PM

## 2016-06-07 LAB — BASIC METABOLIC PANEL
Anion gap: 8 (ref 5–15)
BUN: 22 mg/dL — ABNORMAL HIGH (ref 6–20)
CALCIUM: 9.1 mg/dL (ref 8.9–10.3)
CHLORIDE: 101 mmol/L (ref 101–111)
CO2: 30 mmol/L (ref 22–32)
CREATININE: 1.33 mg/dL — AB (ref 0.44–1.00)
GFR, EST AFRICAN AMERICAN: 40 mL/min — AB (ref >60)
GFR, EST NON AFRICAN AMERICAN: 35 mL/min — AB (ref >60)
Glucose, Bld: 101 mg/dL — ABNORMAL HIGH (ref 65–99)
Potassium: 4.6 mmol/L (ref 3.5–5.1)
SODIUM: 139 mmol/L (ref 135–145)

## 2016-06-07 LAB — MRSA PCR SCREENING: MRSA BY PCR: NEGATIVE

## 2016-06-07 LAB — CBC
HCT: 43 % (ref 35.0–47.0)
HEMOGLOBIN: 13.8 g/dL (ref 12.0–16.0)
MCH: 25.7 pg — ABNORMAL LOW (ref 26.0–34.0)
MCHC: 32.2 g/dL (ref 32.0–36.0)
MCV: 79.6 fL — ABNORMAL LOW (ref 80.0–100.0)
PLATELETS: 144 10*3/uL — AB (ref 150–440)
RBC: 5.4 MIL/uL — ABNORMAL HIGH (ref 3.80–5.20)
RDW: 18.3 % — ABNORMAL HIGH (ref 11.5–14.5)
WBC: 6.6 10*3/uL (ref 3.6–11.0)

## 2016-06-07 NOTE — Progress Notes (Signed)
[facility] Cardiology [facility] Encounter Note  Patient: [name] / Admit Date: 06/05/2016 / Date of Encounter: 06/07/2016, 1:27 PM   Subjective: Some shortness of breath when active. Some mild lower extremity edema. Improved from before with no evidence of chest pain Echocardiogram showing severe LV systolic dysfunction with ejection fraction of 20% and significant valvular heart disease Review of Systems: Positive for: Shortness of breath Negative for: Vision change, hearing change, syncope, dizziness, nausea, vomiting,diarrhea, bloody stool, stomach pain, cough, congestion, diaphoresis, urinary frequency, urinary pain,skin lesions, skin rashes Others previously listed  Objective: Telemetry: Normal sinus rhythm Physical Exam: Blood pressure 100/61, pulse 86, temperature 97 F (36.1 C), temperature source Axillary, resp. rate 17, height  (1.575 m), weight 170 lb 11.2 oz (77.429 kg), SpO2 98 %. Body mass index is 31.21 kg/(m^2). General: Well developed, well nourished, in no acute distress. Head: Normocephalic, atraumatic, sclera non-icteric, no xanthomas, nares are without discharge. Neck: No apparent masses Lungs: Normal respirations with no wheezes, no rhonchi, no rales , some crackles   Heart: Regular rate and rhythm, normal S1 S2, no murmur, no rub, no gallop, PMI is normal size and placement, carotid upstroke normal without bruit, jugular venous pressure normal Abdomen: Soft, non-tender, non-distended with normoactive bowel sounds. No hepatosplenomegaly. Abdominal aorta is normal size without bruit Extremities: 1+ edema, no clubbing, no cyanosis, no ulcers,  Peripheral: 2+ radial, 2+ femoral, 2+ dorsal pedal pulses Neuro: Alert and not oriented. Moves all extremities spontaneously. Psych:  Responds to questions appropriately with a normal affect but unable to answer questions clearly.   Intake/Output Summary (Last 24 hours) at 06/07/16 1327 Last data filed at 06/07/16  1300  Gross per 24 hour  Intake    480 ml  Output    550 ml  Net    -70 ml    Inpatient Medications:  . calcium-vitamin D  1 tablet Oral BID  . enoxaparin (LOVENOX) injection  30 mg Subcutaneous Q24H  . furosemide  40 mg Intravenous BID  . lisinopril  10 mg Oral QHS  . metoprolol tartrate  25 mg Oral BID  . potassium citrate  10 mEq Oral BID  . sodium chloride flush  3 mL Intravenous Q12H   Infusions:    Labs:  Recent Labs  06/06/16 0552 06/07/16 0515  NA 137 139  K 4.5 4.6  CL 102 101  CO2 28 30  GLUCOSE 117* 101*  BUN 25* 22*  CREATININE 1.50* 1.33*  CALCIUM 9.0 9.1   No results for input(s): AST, ALT, ALKPHOS, BILITOT, PROT, ALBUMIN in the last 72 hours.  Recent Labs  06/06/16 0552 06/07/16 0515  WBC 6.6 6.6  HGB 13.5 13.8  HCT 42.1 43.0  MCV 79.9* 79.6*  PLT 157 144*    Recent Labs  06/05/16 1432 06/05/16 1802 06/05/16 2341 06/06/16 0552  TROPONINI 0.14* 0.13* 0.13* 0.13*   Invalid input(s): POCBNP No results for input(s): HGBA1C in the last 72 hours.   Weights: Filed Weights   06/05/16 1736 06/06/16 0354 06/07/16 0426  Weight: 173 lb 14.4 oz (78.881 kg) 171 lb 14.4 oz (77.973 kg) 170 lb 11.2 oz (77.429 kg)     Radiology/Studies:  Dg Chest 2 View  06/05/2016  CLINICAL DATA:  Weeping edema bilateral lower extremities. Shortness of breath for 3 days. EXAM: CHEST  2 VIEW COMPARISON:  PA and lateral chest 04/25/2016 and 03/22/2016. FINDINGS: There is marked cardiomegaly with pulmonary vascular congestion. The patient has a new small right pleural effusion  with basilar atelectasis. The left lung is clear. Atherosclerotic vascular disease is noted. No pneumothorax. IMPRESSION: Cardiomegaly and pulmonary vascular congestion. New small right pleural effusion and basilar atelectasis. Atherosclerosis. Electronically Signed   By: Drusilla Kannerhomas  Dalessio M.D.   On: 06/05/2016 14:58     Assessment and Recommendation  82 y.o. female with acute on chronic systolic  dysfunction congestive heart failure previously on appropriate medication management with no current evidence of myocardial infarction most consistent with poor diet at the outside facility when interviewed it does appear the patient has a very high salt diet. 1. Continue furosemide for lower extremity edema and congestive heart failure 2. ACE inhibitor for cardiomyopathy treatment 3. Change in treatment with low sodium diet less than 2000 mg and further assurance that outside facility would continue to comply to reduce rehospitalization 4. No further cardiac diagnostics necessary at this time  Signed, Arnoldo HookerBruce Ihan Pat M.D. FACC

## 2016-06-07 NOTE — Progress Notes (Signed)
Patient's family spoke to [facility] Admissions. Per Thayer Headings patient's family are interested in a higher level of care (Possibly ALF- Memory Care or SNF) CSW met with patient's daughter and son-in-law at bedside. They requested CSW send referrals for SNF placement and other ALFs. CSW informed patient's family that a LTC bed may not be available in this area. Patient's family reports that if a bed is not available patient will go back to [facility]. Sent LTC SNF referral and ALF referral. Awaiting bed offers.  Ernest Pine, MSW, [place], [place] Clinical Social Worker [phone]

## 2016-06-07 NOTE — Care Management Important Message (Signed)
Important Message  Patient Details  Name: Paula Comptonmmer I Axel MRN: [id number] Date of Birth: [birth year]   Medicare Important Message Given:  Yes    Olegario MessierKathy A Dondre Catalfamo 06/07/2016, 1:57 PM

## 2016-06-07 NOTE — Progress Notes (Signed)
Patient ID: [name], female   DOB: 1926-09-16, 82 y.o.   MRN: [id number] [facility] Physicians -  at [facility]   PATIENT NAME: [name]    MR#:  [id number]  DATE OF BIRTH:  Nov 10, 1926  SUBJECTIVE:  Feeling better. Neg 1.3 liters REVIEW OF SYSTEMS:   Review of Systems  Constitutional: Negative for fever, chills and weight loss.  HENT: Negative for ear discharge, ear pain and nosebleeds.   Eyes: Negative for blurred vision, pain and discharge.  Respiratory: Positive for shortness of breath. Negative for sputum production, wheezing and stridor.   Cardiovascular: Positive for leg swelling. Negative for chest pain, palpitations, orthopnea and PND.  Gastrointestinal: Negative for nausea, vomiting, abdominal pain and diarrhea.  Genitourinary: Negative for urgency and frequency.  Musculoskeletal: Negative for back pain and joint pain.  Neurological: Negative for sensory change, speech change, focal weakness and weakness.  Psychiatric/Behavioral: Negative for depression and hallucinations. The patient is not nervous/anxious.    Tolerating Diet:yes  DRUG ALLERGIES:   Allergies  Allergen Reactions  . Risedronate Rash    VITALS:  Blood pressure 100/61, pulse 86, temperature 97 F (36.1 C), temperature source Axillary, resp. rate 17, height  (1.575 m), weight 77.429 kg (170 lb 11.2 oz), SpO2 98 %.  PHYSICAL EXAMINATION:   Physical Exam  GENERAL:  82 y.o.-year-old patient lying in the bed with no acute distress.  EYES: Pupils equal, round, reactive to light and accommodation. No scleral icterus. Extraocular muscles intact.  HEENT: Head atraumatic, normocephalic. Oropharynx and nasopharynx clear.  NECK:  Supple, no jugular venous distention. No thyroid enlargement, no tenderness.  LUNGS: Normal breath sounds bilaterally, no wheezing, rales, rhonchi. No use of accessory muscles of respiration.  CARDIOVASCULAR: S1, S2 normal. No murmurs, rubs, or  gallops.  ABDOMEN: Soft, nontender, nondistended. Bowel sounds present. No organomegaly or mass.  EXTREMITIES: No cyanosis, clubbing , ++ edema b/l.    NEUROLOGIC: Cranial nerves II through XII are intact. No focal Motor or sensory deficits b/l.   PSYCHIATRIC:  patient is alert and oriented x 3.  SKIN: No obvious rash, lesion, or ulcer.  LABORATORY PANEL:  CBC  Recent Labs Lab 06/07/16 0515  WBC 6.6  HGB 13.8  HCT 43.0  PLT 144*    Chemistries   Recent Labs Lab 06/07/16 0515  NA 139  K 4.6  CL 101  CO2 30  GLUCOSE 101*  BUN 22*  CREATININE 1.33*  CALCIUM 9.1   Cardiac Enzymes  Recent Labs Lab 06/06/16 0552  TROPONINI 0.13*   RADIOLOGY:  Dg Chest 2 View  06/05/2016  CLINICAL DATA:  Weeping edema bilateral lower extremities. Shortness of breath for 3 days. EXAM: CHEST  2 VIEW COMPARISON:  PA and lateral chest 04/25/2016 and 03/22/2016. FINDINGS: There is marked cardiomegaly with pulmonary vascular congestion. The patient has a new small right pleural effusion with basilar atelectasis. The left lung is clear. Atherosclerotic vascular disease is noted. No pneumothorax. IMPRESSION: Cardiomegaly and pulmonary vascular congestion. New small right pleural effusion and basilar atelectasis. Atherosclerosis. Electronically Signed   By: Drusilla Kanner M.D.   On: 06/05/2016 14:58   ASSESSMENT AND PLAN:  [name] is a 82 y.o. female with a known history of Chronic diastolic congestive heart failure, GERD, hypertension, hyperlipidemia, Alzheimer's disease comes in from [facility] assisted living with increasing shortness of breath and weight gain of about 7-9 pounds and leg edema.  1. Acute on chronic congestive heart failure Systolic - echo on this  admission shows EF of 20-25% (but previous records shows 50-55%), if real, then may need further w/up - Increase dose of IV Lasix 40 mg twice a day monitor I's and O's monitor creatinine - daily wt, strict I & Os - neg 1.3  liters -Cardiology following  2. Acute on Chronic kidney disease stage III Baseline creatinine around 1.3, close to baseline - monitor while on lasix, if worsens, may need to stop  3. Generalized weakness. PT, OT eval  4. Alzheimer's dementia Patient is from [facility] assisted [facility]   5. Elevated troponin suspect demand related, monitor on tele   Case discussed with Care Management/Social Worker. Management plans discussed with the patient, family and they are in agreement.    CODE STATUS: full   TOTAL TIME TAKING CARE OF THIS PATIENT: 30 minutes.  >50% time spent on counselling and coordination of care  POSSIBLE D/C IN 1-2 DAYS, DEPENDING ON CLINICAL CONDITION and cardio eval  Note: This dictation was prepared with Dragon dictation along with smaller phrase technology. Any transcriptional errors that result from this process are unintentional.  [facility], [name] M.D on 06/07/2016 at 1:21 PM  Between 7am to 6pm - Pager - [phone]  After 6pm go to [email or web address] - password [email or web address] [facility]  [facility]  Office  [phone]  CC: Primary care physician; [facility]

## 2016-06-07 NOTE — Progress Notes (Signed)
[facility] She reports that she's with a family and will call CSW back. CSW will continue to follow and assist.  Woodroe Modehristina Sandralee Tarkington, MSW, LCSW-A, LCAS-A Clinical Social Worker [phone]

## 2016-06-07 NOTE — Progress Notes (Addendum)
CSW presented bed offers Environmental [facility] Patient's daughter requested CSW speak to patient's granddaughter and called her on her cell phone. CSW presented bed offers. Patient's granddaughter inquired about fees for moving patient to a higher level of care. CSW informed patient's granddaughter that it's typically about the same fee. Explained the transition of ALF medicaid and LTC medicaid . She reports she'll look into the facility and make a decision. CSW encouraged family to tour the facilities. Patient's family is going to tour facilities tomorrow 06/08/16. CSW awaiting family's decision. CSW will continue to follow and assist.  [name], MSW, LCSW-A, LCAS-A Clinical Social Worker [phone]

## 2016-06-07 NOTE — Evaluation (Signed)
Physical Therapy Evaluation Patient Details Name: [name] MRN: [id number] DOB: 1926-05-17 Today's Date: 06/07/2016   History of Present Illness  Patient is an 82 y/o female that presents from ALF with complaints of shortness of breath. She has a history of CHF. Noted to have weeping edema on RLE.   Clinical Impression  Patient is a very pleasant 82 y/o female that presents with complaints of shortness of breath. She has had increasing LE edema and history of diastolic CHF. Per daughter, patient has been fairly mobile in ALF with RW. She has had mild decrease in gait speed per daughter over the last few months. Patient is able to complete 5x sit to stand with use of UEs in 19 seconds, ambulate ~ 110' with RW, decreased cadence and flexed trunk throughout. She requires cuing to bring RW closer to her COM, though no loss of balance noted. She may benefit from bout of HHPT, given her poor gait mechanics and deconditioning.     Follow Up Recommendations Home health PT (In ALF)    Equipment Recommendations       Recommendations for Other Services       Precautions / Restrictions Precautions Precautions: Fall Restrictions Weight Bearing Restrictions: No      Mobility  Bed Mobility               General bed mobility comments: Pt received in recliner.   Transfers Overall transfer level: Needs assistance Equipment used: Rolling walker (2 wheeled) Transfers: Sit to/from Stand Sit to Stand: Supervision         General transfer comment: Patient uses handrails on chair to complete sit to stand transfers, no loss of balance noted.   Ambulation/Gait Ambulation/Gait assistance: Supervision;Min guard Ambulation Distance (Feet): 110 Feet Assistive device: Rolling walker (2 wheeled) Gait Pattern/deviations: Decreased step length - right;Decreased step length - left;Trunk flexed;Shuffle   Gait velocity interpretation: Below normal speed for age/gender General Gait Details:  Minimal step lengths noted with gait, RW anterior to COM. Decreased gait speed initially per daughter from recent baseline, though patient able to increase speed temporarily. No buckling or loss of balance.   Stairs            Wheelchair Mobility    Modified Rankin (Stroke Patients Only)       Balance Overall balance assessment: Needs assistance Sitting-balance support: No upper extremity supported Sitting balance-Leahy Scale: Fair     Standing balance support: Bilateral upper extremity supported Standing balance-Leahy Scale: Fair Standing balance comment: RW anterior to COM, improved temporarily with cuing, then reverted to baseline.                              Pertinent Vitals/Pain      Home Living Family/patient expects to be discharged to:: Assisted living               Home Equipment: Walker - 2 wheels;Grab bars - toilet;Shower seat Additional Comments: Per daughter, patient has progressively slowed with ambulation over the past few weeks to months.     Prior Function Level of Independence: Independent with assistive device(s)         Comments: She has assistance from staff for housekeeping and meals at ALF.      Hand Dominance        Extremity/Trunk Assessment   Upper Extremity Assessment: Overall WFL for tasks assessed           Lower  Extremity Assessment: Overall WFL for tasks assessed         Communication   Communication: No difficulties  Cognition                            General Comments General comments (skin integrity, edema, etc.): Pitting edema/weeping edema more noticeable on RLE than LLE.     Exercises Other Exercises Other Exercises: 5x sit to stand with use of UEs in 19 seconds.       Assessment/Plan    PT Assessment Patient needs continued PT services  PT Diagnosis Difficulty walking;Generalized weakness   PT Problem List Decreased strength;Decreased mobility;Cardiopulmonary status  limiting activity;Decreased balance;Decreased knowledge of use of DME  PT Treatment Interventions DME instruction;Therapeutic activities;Therapeutic exercise;Gait training;Balance training   PT Goals (Current goals can be found in the Care Plan section) Acute Rehab PT Goals Patient Stated Goal: To improve walking speed  PT Goal Formulation: With patient/family Time For Goal Achievement: 06/21/16 Potential to Achieve Goals: Good    Frequency Min 2X/week   Barriers to discharge        Co-evaluation               End of Session Equipment Utilized During Treatment: Gait belt Activity Tolerance: Patient tolerated treatment well Patient left: in chair;with call bell/phone within reach;with family/visitor present;with chair alarm set Nurse Communication: Mobility status         Time: 1610-9604 PT Time Calculation (min) (ACUTE ONLY): 15 min   Charges:   PT Evaluation $PT Eval Low Complexity: 1 Procedure     PT G Codes:       Kerin Ransom, PT, DPT    06/07/2016, 4:14 PM

## 2016-06-08 MED ORDER — LISINOPRIL 10 MG PO TABS: 10.0000 mg | ORAL_TABLET | Freq: Every day | ORAL | Status: DC

## 2016-06-08 MED ORDER — HYDROCORTISONE 1 % EX CREA
TOPICAL_CREAM | CUTANEOUS | Status: DC | PRN
  Filled 2016-06-08: qty 28

## 2016-06-08 MED ORDER — POTASSIUM CHLORIDE ER 10 MEQ PO TBCR: 10.0000 meq | EXTENDED_RELEASE_TABLET | Freq: Every day | ORAL | Status: DC

## 2016-06-08 MED ORDER — HYDROCORTISONE 1 % EX CREA: TOPICAL_CREAM | CUTANEOUS | Status: DC | PRN

## 2016-06-08 NOTE — Progress Notes (Signed)
Follow-up appointment made at the Heart Failure Clinic on June 20, 2016 at 11:30am. Thank you.

## 2016-06-08 NOTE — Discharge Instructions (Signed)
1. TED compression stocking to both lower extremities  Heart Failure Clinic appointment on June 20, 2016 at 11:30am with Clarisa Kindredina Abdul Beirne, FNP. Please call [phone] to reschedule.

## 2016-06-08 NOTE — Discharge Summary (Signed)
[facility] Physicians - [place] at [facility]   PATIENT NAME: [name]    MR#:  [id number]  DATE OF BIRTH:  1926-09-27  DATE OF ADMISSION:  06/05/2016 ADMITTING PHYSICIAN: Wyatt Haste, MD  DATE OF DISCHARGE: 06/08/16  PRIMARY CARE PHYSICIAN: [facility]    ADMISSION DIAGNOSIS:  Congestive heart failure, unspecified congestive heart failure chronicity, unspecified congestive heart failure type (HCC) [I50.9]  DISCHARGE DIAGNOSIS:  Principal Problem:   Acute on chronic diastolic (congestive) heart failure (HCC)   SECONDARY DIAGNOSIS:   Past Medical History  Diagnosis Date  . Diabetes mellitus     TYPE 2  . Hypertension   . Hyperlipidemia   . Osteoporosis   . GERD (gastroesophageal reflux disease)   . Chronic diastolic CHF (congestive heart failure) (HCC)   . BBB (bundle branch block)     CHRONIC  LEFT BBB  . CHF (congestive heart failure) (HCC)   . Alzheimer disease   . CKD (chronic kidney disease), stage III   . Atrial fibrillation (HCC)   . Coronary artery disease     HOSPITAL COURSE:   [name] is a 82 y.o. female with a known history of Chronic systoliccongestive heart failure, GERD, hypertension, hyperlipidemia, Alzheimer's disease comes in from [facility] assisted [facility] with increasing shortness of breath and weight gain of about 7-9 pounds and leg edema.  1. Acute on chronic congestive heart failure-  Systolic heart failure - echo on this admission shows EF of 20-25% (but previous records shows 50-55%) -diuresed with lasix and discharge on BID lasix  - LOW SALT DIET AND FLUID RESTRICTIONS explained to patient and daughter at bedside -Cardiology consult appreciated - on metoprolol and lisinopril - also advise TEDs to both legs  2. Acute on Chronic kidney disease stage III- Improved Baseline creatinine around 1.3, close to baseline - on lasix and lisinopril  3. Generalized weakness. Physical therapy will be  continued  4. Alzheimer's dementia- pleasantly confused, at baseline Going to SNF  5. Elevated troponin - demand related  Discharge today  DISCHARGE CONDITIONS:   Guarded  CONSULTS OBTAINED:  Treatment Team:  Wyatt Haste, MD Lamar Blinks, MD  DRUG ALLERGIES:   Allergies  Allergen Reactions  . Risedronate Rash    DISCHARGE MEDICATIONS:   Current Discharge Medication List    START taking these medications   Details  hydrocortisone cream 1 % Apply topically as needed for itching. Qty: 30 g, Refills: 0      CONTINUE these medications which have CHANGED   Details  lisinopril (PRINIVIL,ZESTRIL) 10 MG tablet Take 1 tablet (10 mg total) by mouth at bedtime. Qty: 30 tablet, Refills: 2    potassium chloride (K-DUR) 10 MEQ tablet Take 1 tablet (10 mEq total) by mouth daily. Qty: 30 tablet, Refills: 2      CONTINUE these medications which have NOT CHANGED   Details  acetaminophen (TYLENOL) 500 MG tablet Take 500 mg by mouth 2 (two) times daily.    Calcium Carb-Cholecalciferol (CALCIUM PLUS VITAMIN D3) 600-800 MG-UNIT TABS Take 1 tablet by mouth 2 (two) times daily.    furosemide (LASIX) 20 MG tablet Take 20 mg by mouth 2 (two) times daily.    guaiFENesin-dextromethorphan (ROBITUSSIN DM) 100-10 MG/5ML syrup Take 5 mLs by mouth every 4 (four) hours as needed for cough. Qty: 118 mL, Refills: 0    metoprolol tartrate (LOPRESSOR) 25 MG tablet Take 1 tablet (25 mg total) by mouth 2 (two) times daily. Qty: 60  tablet, Refills: 2    polyethylene glycol (MIRALAX / GLYCOLAX) packet Take 17 g by mouth daily as needed.       STOP taking these medications     guaiFENesin (MUCINEX) 600 MG 12 hr tablet      potassium citrate (UROCIT-K) 10 MEQ (1080 MG) SR tablet          DISCHARGE INSTRUCTIONS:   1. TEDs to both legs as tolerated 2. PCP f/u in 1 week 3. LOW SALT DIET  If you experience worsening of your admission symptoms, develop shortness of breath, life  threatening emergency, suicidal or homicidal thoughts you must seek medical attention immediately by calling 911 or calling your MD immediately  if symptoms less severe.  You Must read complete instructions/literature along with all the possible adverse reactions/side effects for all the Medicines you take and that have been prescribed to you. Take any new Medicines after you have completely understood and accept all the possible adverse reactions/side effects.   Please note  You were cared for by a hospitalist during your hospital stay. If you have any questions about your discharge medications or the care you received while you were in the hospital after you are discharged, you can call the unit and asked to speak with the hospitalist on call if the hospitalist that took care of you is not available. Once you are discharged, your primary care physician will handle any further medical issues. Please note that NO REFILLS for any discharge medications will be authorized once you are discharged, as it is imperative that you return to your primary care physician (or establish a relationship with a primary care physician if you do not have one) for your aftercare needs so that they can reassess your need for medications and monitor your lab values.    Today   CHIEF COMPLAINT:   Chief Complaint  Patient presents with  . Shortness of Breath  . Leg Swelling    VITAL SIGNS:  Blood pressure 112/60, pulse 92, temperature 98.2 F (36.8 C), temperature source Oral, resp. rate 16, height 5\' 2"  (1.575 m), weight 75.479 kg (166 lb 6.4 oz), SpO2 97 %.  I/O:   Intake/Output Summary (Last 24 hours) at 06/08/16 1140 Last data filed at 06/08/16 1005  Gross per 24 hour  Intake    483 ml  Output   1500 ml  Net  -1017 ml    PHYSICAL EXAMINATION:   Physical Exam  GENERAL:  82 y.o.-year-old patient sitting in the chair with no acute distress.  EYES: Pupils equal, round, reactive to light and  accommodation. No scleral icterus. Extraocular muscles intact. Hard of hearing HEENT: Head atraumatic, normocephalic. Oropharynx and nasopharynx clear.  NECK:  Supple, no jugular venous distention. No thyroid enlargement, no tenderness.  LUNGS: Normal breath sounds bilaterally, no wheezing, rales,rhonchi or crepitation. No use of accessory muscles of respiration.  CARDIOVASCULAR: S1, S2 normal. No rubs, or gallops. 2/6 systolic murmur present. ABDOMEN: Soft, non-tender, non-distended. Bowel sounds present. No organomegaly or mass.  EXTREMITIES: No cyanosis, or clubbing. 2+ left foot edema and 1+ right foot edema NEUROLOGIC: Cranial nerves II through XII are intact. Muscle strength 5/5 in all extremities. Sensation intact. Gait not checked.  PSYCHIATRIC: The patient is alert and oriented x 2.  SKIN: No obvious rash, lesion, or ulcer. Dry skin and scratch marks noted.  DATA REVIEW:   CBC  Recent Labs Lab 06/07/16 0515  WBC 6.6  HGB 13.8  HCT 43.0  PLT 144*  Chemistries   Recent Labs Lab 06/07/16 0515  NA 139  K 4.6  CL 101  CO2 30  GLUCOSE 101*  BUN 22*  CREATININE 1.33*  CALCIUM 9.1    Cardiac Enzymes  Recent Labs Lab 06/06/16 0552  TROPONINI 0.13*    Microbiology Results  Results for orders placed or performed during the hospital encounter of 06/05/16  MRSA PCR Screening     Status: None   Collection Time: 06/07/16  4:32 AM  Result Value Ref Range Status   MRSA by PCR NEGATIVE NEGATIVE Final    Comment:        The GeneXpert MRSA Assay (FDA approved for NASAL specimens only), is one component of a comprehensive MRSA colonization surveillance program. It is not intended to diagnose MRSA infection nor to guide or monitor treatment for MRSA infections.     RADIOLOGY:  No results found.  EKG:   Orders placed or performed during the hospital encounter of 06/05/16  . ED EKG within 10 minutes  . ED EKG within 10 minutes  . EKG 12-Lead  . EKG  12-Lead      Management plans discussed with the patient, family and they are in agreement.  CODE STATUS:     Code Status Orders        Start     Ordered   06/05/16 1641  Full code   Continuous     06/05/16 1641    Code Status History    Date Active Date Inactive Code Status Order ID Comments User Context   04/25/2016 12:25 PM 04/27/2016  4:33 PM Full Code [id number]  Enedina Finner, MD ED   04/25/2016 11:58 AM 04/25/2016 12:25 PM Full Code [id number]  Enedina Finner, MD ED   04/13/2016 11:31 PM 04/19/2016  4:45 PM Full Code [id number]  Oralia Manis, MD Inpatient      TOTAL TIME TAKING CARE OF THIS PATIENT: 37 minutes.    Enid Baas M.D on 06/08/2016 at 11:40 AM  Between 7am to 6pm - Pager - [phone]  After 6pm go to [email or web address] - password [email or web address] [facility]  [place] [facility]  Office  [phone]  CC: Primary care physician; [facility]

## 2016-06-08 NOTE — Progress Notes (Signed)
[facility]   Questions answered.  No distress.

## 2016-06-08 NOTE — NC FL2 (Signed)
[place] MEDICAID FL2 LEVEL OF CARE SCREENING TOOL     IDENTIFICATION  Patient Name: [name] Birthdate: 08/20/1926 Sex: female Admission Date (Current Location): 06/05/2016  [facility] [place] Number:  [address]  ([id number]) Facility and Address:  [facility], [address], [place], [place] [address]      Provider Number: [id number]  Attending Physician Name and Address:  Enid Baas, MD  Relative Name and Phone Number:       Current Level of Care: Hospital Recommended Level of Care: Skilled Nursing Facility Prior Approval Number:    Date Approved/Denied:   PASRR Number:  [id number])  Discharge Plan: SNF    Current Diagnoses: Patient Active Problem List   Diagnosis Date Noted  . Acute on chronic diastolic (congestive) heart failure (HCC) 06/05/2016  . Skin lesion on examination 08/06/2015  . Chronic diastolic CHF (congestive heart failure) (HCC) 05/11/2015  . Vitamin B 12 deficiency 05/11/2015  . Type 2 diabetes mellitus without complication (HCC)   . Hypertension   . Hyperlipidemia   . Osteoporosis   . GERD (gastroesophageal reflux disease)   . BBB (bundle branch block)     Orientation RESPIRATION BLADDER Height & Weight     Self  Normal Incontinent Weight: 166 lb 6.4 oz (75.479 kg) Height:   (157.5 cm)  BEHAVIORAL SYMPTOMS/MOOD NEUROLOGICAL BOWEL NUTRITION STATUS   (None)  (None) Continent Diet (Heart Healthy)  AMBULATORY STATUS COMMUNICATION OF NEEDS Skin   Limited Assist Verbally Normal                       Personal Care Assistance Level of Assistance  Bathing, Dressing, Feeding Bathing Assistance: Limited assistance Feeding assistance: Limited assistance Dressing Assistance: Limited assistance     Functional Limitations Info  Sight, Hearing, Speech Sight Info: Adequate Hearing Info: Adequate Speech Info: Adequate    SPECIAL CARE FACTORS FREQUENCY                        Contractures Contractures Info: Not present    Additional Factors Info  Code Status, Allergies Code Status Info: Full Code Allergies Info: Risedronate           Current Medications (06/08/2016):  This is the current hospital active medication list Current Facility-Administered Medications  Medication Dose Route Frequency Provider Last Rate Last Dose  . acetaminophen (TYLENOL) tablet 650 mg  650 mg Oral Q6H PRN Wyatt Haste, MD       Or  . acetaminophen (TYLENOL) suppository 650 mg  650 mg Rectal Q6H PRN Wyatt Haste, MD      . calcium-vitamin D (OSCAL WITH D) 500-200 MG-UNIT per tablet 1 tablet  1 tablet Oral BID Wyatt Haste, MD   1 tablet at 06/08/16 0912  . enoxaparin (LOVENOX) injection 30 mg  30 mg Subcutaneous Q24H Sheema M Hallaji, RPH   30 mg at 06/07/16 1658  . furosemide (LASIX) injection 40 mg  40 mg Intravenous BID Delfino Lovett, MD   40 mg at 06/08/16 0742  . hydrocortisone cream 1 %   Topical PRN Vipul Sherryll Burger, MD      . ipratropium-albuterol (DUONEB) 0.5-2.5 (3) MG/3ML nebulizer solution 3 mL  3 mL Nebulization Q4H PRN Wyatt Haste, MD      . lisinopril (PRINIVIL,ZESTRIL) tablet 10 mg  10 mg Oral QHS Wyatt Haste, MD   10 mg at 06/07/16 2104  . metoprolol tartrate (LOPRESSOR) tablet 25  mg  25 mg Oral BID Wyatt Hasteavid K Hower, MD   25 mg at 06/08/16 0912  . morphine 2 MG/ML injection 2 mg  2 mg Intravenous Q4H PRN Wyatt Hasteavid K Hower, MD      . ondansetron [facility](ZOFRAN) tablet 4 mg  4 mg Oral Q6H PRN Wyatt Hasteavid K Hower, MD       Or  . ondansetron [facility](ZOFRAN) injection 4 mg  4 mg Intravenous Q6H PRN Wyatt Hasteavid K Hower, MD      . oxyCODONE (Oxy IR/ROXICODONE) immediate release tablet 5 mg  5 mg Oral Q4H PRN Wyatt Hasteavid K Hower, MD      . polyethylene glycol (MIRALAX / GLYCOLAX) packet 17 g  17 g Oral Daily PRN Wyatt Hasteavid K Hower, MD      . potassium citrate (UROCIT-K) SR tablet 10 mEq  10 mEq Oral BID Wyatt Hasteavid K Hower, MD   10 mEq at 06/08/16 0912  . sodium chloride flush (NS) 0.9 % injection 3 mL  3 mL  Intravenous Q12H Wyatt Hasteavid K Hower, MD   3 mL at 06/08/16 0912     Discharge Medications: Please see discharge summary for a list of discharge medications.  Relevant Imaging Results:  Relevant Lab Results:   Additional Information SSN:  [id number]  Denise EllenChristina E Camaya [employer], LCSW

## 2016-06-08 NOTE — Care Management (Signed)
Followed by [facility] for SN and PT.

## 2016-06-08 NOTE — Clinical Social Work Placement (Signed)
   CLINICAL SOCIAL WORK PLACEMENT  NOTE  Date:  06/08/2016  Patient Details  Name: [name] I Jasmin MRN: [id number] Date of Birth: 03/25/1926  Clinical Social Work is seeking post-discharge placement for this patient at the Skilled  Nursing Facility level of care (*CSW will initial, date and re-position this form in  chart as items are completed):  Yes   Patient/family provided with [place] Clinical Social Work Department's list of facilities offering this level of care within the geographic area requested by the patient (or if unable, by the patient's family).  Yes   Patient/family informed of their freedom to choose among providers that offer the needed level of care, that participate in Medicare, Medicaid or managed care program needed by the patient, have an available bed and are willing to accept the patient.  Yes   Patient/family informed of [facility]'s ownership interest in [facility] and [facility], as well as of the fact that they are under no obligation to receive care at these facilities.  PASRR submitted to EDS on 06/08/16     PASRR number received on 06/08/16     Existing PASRR number confirmed on       FL2 transmitted to all facilities in geographic area requested by pt/family on 06/08/16     FL2 transmitted to all facilities within larger geographic area on       Patient informed that his/her managed care company has contracts with or will negotiate with certain facilities, including the following:        Yes   Patient/family informed of bed offers received.  Patient chooses bed at  [facility]     Physician recommends and patient chooses bed at      Patient to be transferred to  [facility]) on 06/08/16.  Patient to be transferred to facility by  [facility]     Patient family notified on 06/08/16 of transfer.  Name of family member notified:   (Daugther )     PHYSICIAN       Additional Comment:     _______________________________________________ Idamae Lusherhristina E Diala Waxman, LCSW 06/08/2016, 12:06 PM

## 2016-06-08 NOTE — Progress Notes (Signed)
Clinical Social Worker was informed that patient will be medically ready to discharge to [facility]. Patient and family are in a agreement with plan.CSW informed Liborio NixonJanice- Admissions at [facility]. Patient's family will retreive patient's belongings from [facility]. CSW called Doug- Admissions Coordinator at [facility] to confirm that patient's bed is ready. Provided patient's room number 310 B and number to call for report [phone] . All discharge information faxed to [facility] via [employer]. RN will call report and patient will discharge to [facility] via family transport.  Woodroe Modehristina Dwane Andres, MSW, LCSW-A, LCAS-A Clinical Social Worker [phone]

## 2016-06-08 NOTE — Progress Notes (Signed)
Report called to Paula DupontDelsa Grana LPN at [facility]

## 2016-06-08 NOTE — Progress Notes (Signed)
CSW met with patient and her family at bedside. Per patient's family they would like to speak to Doug-Admissions Coordinator at [facility]. Reported they think they would like patient to go to [facility]. CSW informed patient and her family that patient is medically stable to discharge today. CSW will revisit patient once Marden Noble has met with them. CSW will continue to follow and assist.  Ernest Pine, MSW, [place], [place] Social Worker [phone]

## 2016-06-13 ENCOUNTER — Encounter: Payer: Self-pay | Admitting: Cardiovascular Disease

## 2016-06-13 ENCOUNTER — Ambulatory Visit (INDEPENDENT_AMBULATORY_CARE_PROVIDER_SITE_OTHER): Payer: Medicare Other | Admitting: Cardiovascular Disease

## 2016-06-13 VITALS — BP 100/60 | HR 78 | Ht 65.0 in

## 2016-06-13 DIAGNOSIS — I5022 Chronic systolic (congestive) heart failure: Secondary | ICD-10-CM

## 2016-06-13 DIAGNOSIS — I5032 Chronic diastolic (congestive) heart failure: Secondary | ICD-10-CM

## 2016-06-13 MED ORDER — FUROSEMIDE 20 MG PO TABS: ORAL_TABLET | ORAL | Status: DC

## 2016-06-13 NOTE — Progress Notes (Signed)
Cardiology Office Note   Date:  06/13/2016   ID:  [name], DOB 1926-03-17, MRN [id number]  PCP:  [facility]  Cardiologist:   Lorine Bears, MD   Chief Complaint  Patient presents with  . Follow-up    12 month f/u      History of Present Illness: [name] is a 82 y.o. female who presents for A yearly follow-up visit. She was most recently seen by Dr.Nahser in July 2016. She has no history of coronary artery disease status post CABG in 2012, postoperative atrial fibrillation, left bundle branch block, essential hypertension, hyperlipidemia and diabetes mellitus. This was a yearly follow-up visit. However, upon reviewing her chart, it appears that she saw Dr.Fath in June and was hospitalized at [facility] in July for acute on chronic systolic heart failure. She had an echocardiogram done which showed an ejection fraction of 20-25% with severe mitral and tricuspid regurgitation. The patient has advanced dementia and cannot provide me with any history. She has no family members. She lives in a nursing home. She denies any symptoms but again she is a poor historian and she doesn't know why she is here.   Past Medical History  Diagnosis Date  . Diabetes mellitus     TYPE 2  . Hypertension   . Hyperlipidemia   . Osteoporosis   . GERD (gastroesophageal reflux disease)   . Chronic diastolic CHF (congestive heart failure) (HCC)   . BBB (bundle branch block)     CHRONIC  LEFT BBB  . CHF (congestive heart failure) (HCC)   . Alzheimer disease   . CKD (chronic kidney disease), stage III   . Atrial fibrillation (HCC)   . Coronary artery disease     Past Surgical History  Procedure Laterality Date  . Cholecystectomy    . Abdominal hysterectomy    . Colonoscopy w/ polypectomy  7/2OO8       05/2010    NEOPLASTIC  POLYPS  . Cabg x 5  08/10/2011    DR BARTLE  . Coronary artery bypass graft  2012     Current Outpatient Prescriptions  Medication Sig Dispense  Refill  . acetaminophen (TYLENOL) 500 MG tablet Take 500 mg by mouth 2 (two) times daily.    . Calcium Carb-Cholecalciferol (CALCIUM PLUS VITAMIN D3) 600-800 MG-UNIT TABS Take 1 tablet by mouth 2 (two) times daily.    . furosemide (LASIX) 20 MG tablet Take  in the morning and  in the evening. 45 tablet 3  . guaiFENesin-dextromethorphan (ROBITUSSIN DM) 100-10 MG/5ML syrup Take 5 mLs by mouth every 4 (four) hours as needed for cough. 118 mL 0  . hydrocortisone cream 1 % Apply topically as needed for itching. 30 g 0  . lisinopril (PRINIVIL,ZESTRIL) 10 MG tablet Take 1 tablet (10 mg total) by mouth at bedtime. 30 tablet 2  . metoprolol tartrate (LOPRESSOR) 25 MG tablet Take 1 tablet (25 mg total) by mouth 2 (two) times daily. 60 tablet 2  . polyethylene glycol (MIRALAX / GLYCOLAX) packet Take 17 g by mouth daily as needed.     . potassium chloride (K-DUR) 10 MEQ tablet Take 1 tablet (10 mEq total) by mouth daily. 30 tablet 2   No current facility-administered medications for this visit.    Allergies:   Risedronate    Social History:  The patient  reports that she has quit smoking. She quit smokeless tobacco use about 35 years ago. She reports that she does not  drink alcohol or use illicit drugs.   Family History:  The patient's family history includes Heart disease in her father; Hypertension in her mother.    ROS:  Please see the history of present illness.   Otherwise, review of systems are positive for none.   All other systems are reviewed and negative.    PHYSICAL EXAM: VS:  BP 100/60 mmHg  Pulse 78  Ht 5\' 5"  (1.651 m)  Wt   SpO2 96% , BMI There is no weight on file to calculate BMI. GEN: Well nourished, well developed, in no acute distress HEENT: normal Neck:  Mild JVD, no carotid bruits, or masses Cardiac:  Irregularly irregular no murmurs, rubs, or gallops, +3 edema   Respiratory:  Bibasilar crackles normal work of breathing GI: soft, nontender, nondistended, + BS MS:  no deformity or atrophy Skin: warm and dry, no rash Neuro:  Strength and sensation are intact Psych: euthymic mood, full affect   EKG:  EKG is ordered today. The ekg ordered today demonstrates  atrial fibrillation with left bundle branch block.   Recent Labs: 04/25/2016: ALT 31; B Natriuretic Peptide 773.0* 06/05/2016: TSH 2.080 06/07/2016: BUN 22*; Creatinine, Ser 1.33*; Hemoglobin 13.8; Platelets 144*; Potassium 4.6; Sodium 139    Lipid Panel    Component Value Date/Time   CHOL 149 05/11/2015 1504   CHOL 132 07/31/2011 0550   TRIG 170* 05/11/2015 1504   HDL 45 05/11/2015 1504   HDL 47 07/31/2011 0550   CHOLHDL 3.3 05/11/2015 1504   CHOLHDL 2.8 07/31/2011 0550   VLDL 46* 07/31/2011 0550   LDLCALC 70 05/11/2015 1504   LDLCALC 39 07/31/2011 0550      Wt Readings from Last 3 Encounters:  06/08/16 166 lb 6.4 oz (75.479 kg)  05/05/16 170 lb (77.111 kg)  04/27/16 167 lb 1.6 oz (75.796 kg)       ASSESSMENT AND PLAN:  1.  Chronic systolic heart failure: Severely reduced LV systolic function with ejection fraction of 20-25%. The patient overall is a very poor historian. She appears to be volume overloaded. Thus, I elected to increase the dose of Lasix 40 mg in the morning and 20 mg in the afternoon. She has a follow-up appointment scheduled with the heart failure clinic later this month and she should keep that appointment. It's probably best if this patient follows up with the heart failure clinic on a regular basis and can follow-up with me as needed. Continue treatment with metoprolol and lisinopril.  2. Coronary artery disease: Status post CABG. Given her advanced dementia as it seems, I do not think she is a candidate for any further ischemic cardiac workup.  3. Atrial fibrillation: She is in atrial fibrillation today but ventricular rate is controlled. I do not think she is a good candidate for anticoagulation.  4. Essential hypertension: Blood pressure is  controlled.     Disposition:   FU with me as needed. Keep follow up with the heart failure clinic. If the patient's condition continues to deteriorate, consider hospice care given degree of cardiomyopathy.   Signed,  Lorine BearsMuhammad Francille Wittmann, MD  06/13/2016 6:07 PM    [facility]

## 2016-06-13 NOTE — Patient Instructions (Addendum)
Medication Instructions:  Your physician has recommended you make the following change in your medication:  INCREASE lasix to 40mg  in the morning and 20mg  in the evening.   Labwork: none  Testing/Procedures: none  Follow-Up: Your physician recommends that you keep your appointment with the Heart Failure Clinic   Any Other Special Instructions Will Be Listed Below (If Applicable).     If you need a refill on your cardiac medications before your next appointment, please call your pharmacy.

## 2016-06-20 ENCOUNTER — Encounter: Payer: Self-pay | Admitting: Family

## 2016-06-20 ENCOUNTER — Ambulatory Visit: Payer: Medicare Other | Attending: Family | Admitting: Family

## 2016-06-20 VITALS — BP 109/65 | HR 78 | Resp 18 | Ht 64.0 in | Wt 165.0 lb

## 2016-06-20 DIAGNOSIS — I5032 Chronic diastolic (congestive) heart failure: Secondary | ICD-10-CM | POA: Diagnosis present

## 2016-06-20 DIAGNOSIS — F0280 Dementia in other diseases classified elsewhere without behavioral disturbance: Secondary | ICD-10-CM | POA: Insufficient documentation

## 2016-06-20 DIAGNOSIS — K219 Gastro-esophageal reflux disease without esophagitis: Secondary | ICD-10-CM | POA: Insufficient documentation

## 2016-06-20 DIAGNOSIS — Z888 Allergy status to other drugs, medicaments and biological substances status: Secondary | ICD-10-CM | POA: Diagnosis not present

## 2016-06-20 DIAGNOSIS — I130 Hypertensive heart and chronic kidney disease with heart failure and stage 1 through stage 4 chronic kidney disease, or unspecified chronic kidney disease: Secondary | ICD-10-CM | POA: Insufficient documentation

## 2016-06-20 DIAGNOSIS — M7989 Other specified soft tissue disorders: Secondary | ICD-10-CM | POA: Diagnosis not present

## 2016-06-20 DIAGNOSIS — E785 Hyperlipidemia, unspecified: Secondary | ICD-10-CM | POA: Diagnosis not present

## 2016-06-20 DIAGNOSIS — Z9049 Acquired absence of other specified parts of digestive tract: Secondary | ICD-10-CM | POA: Diagnosis not present

## 2016-06-20 DIAGNOSIS — I447 Left bundle-branch block, unspecified: Secondary | ICD-10-CM | POA: Insufficient documentation

## 2016-06-20 DIAGNOSIS — R600 Localized edema: Secondary | ICD-10-CM | POA: Insufficient documentation

## 2016-06-20 DIAGNOSIS — N183 Chronic kidney disease, stage 3 (moderate): Secondary | ICD-10-CM | POA: Diagnosis not present

## 2016-06-20 DIAGNOSIS — I4891 Unspecified atrial fibrillation: Secondary | ICD-10-CM | POA: Diagnosis not present

## 2016-06-20 DIAGNOSIS — I10 Essential (primary) hypertension: Secondary | ICD-10-CM

## 2016-06-20 DIAGNOSIS — G309 Alzheimer's disease, unspecified: Secondary | ICD-10-CM | POA: Insufficient documentation

## 2016-06-20 DIAGNOSIS — Z87891 Personal history of nicotine dependence: Secondary | ICD-10-CM | POA: Diagnosis not present

## 2016-06-20 DIAGNOSIS — E1122 Type 2 diabetes mellitus with diabetic chronic kidney disease: Secondary | ICD-10-CM | POA: Insufficient documentation

## 2016-06-20 DIAGNOSIS — M810 Age-related osteoporosis without current pathological fracture: Secondary | ICD-10-CM | POA: Insufficient documentation

## 2016-06-20 DIAGNOSIS — Z90710 Acquired absence of both cervix and uterus: Secondary | ICD-10-CM | POA: Diagnosis not present

## 2016-06-20 DIAGNOSIS — R5383 Other fatigue: Secondary | ICD-10-CM | POA: Diagnosis not present

## 2016-06-20 DIAGNOSIS — I2510 Atherosclerotic heart disease of native coronary artery without angina pectoris: Secondary | ICD-10-CM | POA: Diagnosis not present

## 2016-06-20 NOTE — Progress Notes (Signed)
Subjective:    Patient ID: [name], female    DOB: 1926-08-11, 82 y.o.   MRN: [id number]  Congestive Heart Failure  Presents for follow-up visit. Associated symptoms include edema and fatigue. Pertinent negatives include no abdominal pain, chest pain, orthopnea, palpitations or shortness of breath. The symptoms have been worsening. Compliance with total regimen is 0-25%. Compliance problems include adherence to exercise.  Compliance with exercise is 0-25%.  Hypertension  This is a chronic problem. The current episode started more than 1 year ago. The problem is unchanged. The problem is controlled. Associated symptoms include peripheral edema. Pertinent negatives include no chest pain, headaches, neck pain, palpitations or shortness of breath. There are no associated agents to hypertension. Risk factors for coronary artery disease include sedentary lifestyle, family history, diabetes mellitus, dyslipidemia and post-menopausal state. Past treatments include beta blockers, diuretics, lifestyle changes and ACE inhibitors. The current treatment provides significant improvement. Compliance problems include exercise.  Hypertensive end-organ damage includes kidney disease, CAD/MI and heart failure.   Past Medical History:  Diagnosis Date  . Alzheimer disease   . Atrial fibrillation (HCC)   . BBB (bundle branch block)    CHRONIC  LEFT BBB  . CHF (congestive heart failure) (HCC)   . Chronic diastolic CHF (congestive heart failure) (HCC)   . CKD (chronic kidney disease), stage III   . Coronary artery disease   . Diabetes mellitus    TYPE 2  . GERD (gastroesophageal reflux disease)   . Hyperlipidemia   . Hypertension   . Osteoporosis     Past Surgical History:  Procedure Laterality Date  . ABDOMINAL HYSTERECTOMY    . cabg x 5  08/10/2011   DR BARTLE  . CHOLECYSTECTOMY    . COLONOSCOPY W/ POLYPECTOMY  7/2OO8       05/2010   NEOPLASTIC  POLYPS  . CORONARY ARTERY BYPASS GRAFT  2012     Family History  Problem Relation Age of Onset  . Hypertension Mother   . Heart disease Father     Social History  Substance Use Topics  . Smoking status: Former Games developer  . Smokeless tobacco: Former Neurosurgeon    Quit date: 11/27/1980  . Alcohol use No    Allergies  Allergen Reactions  . Risedronate Rash    Prior to Admission medications   Medication Sig Start Date End Date Taking? Authorizing Provider  acetaminophen (TYLENOL) 500 MG tablet Take 500 mg by mouth 2 (two) times daily.   Yes Historical Provider, MD  Calcium Carb-Cholecalciferol (CALCIUM PLUS VITAMIN D3) 600-800 MG-UNIT TABS Take 1 tablet by mouth 2 (two) times daily.   Yes Historical Provider, MD  furosemide (LASIX) 20 MG tablet Take 40mg  in the morning and 20mg  in the evening. 06/13/16  Yes Iran Ouch, MD  hydrocortisone cream 1 % Apply topically as needed for itching. 06/08/16  Yes Enid Baas, MD  lisinopril (PRINIVIL,ZESTRIL) 10 MG tablet Take 1 tablet (10 mg total) by mouth at bedtime. 06/08/16  Yes Enid Baas, MD  metolazone (ZAROXOLYN) 5 MG tablet Take 5 mg by mouth daily as needed.   Yes Historical Provider, MD  metoprolol tartrate (LOPRESSOR) 25 MG tablet Take 1 tablet (25 mg total) by mouth 2 (two) times daily. 04/27/16  Yes Enedina Finner, MD  polyethylene glycol (MIRALAX / GLYCOLAX) packet Take 17 g by mouth daily as needed.    Yes Historical Provider, MD  potassium chloride (K-DUR) 10 MEQ tablet Take 1 tablet (10 mEq total) by mouth  daily. 06/08/16  Yes Enid Baas, MD  potassium chloride SA (K-DUR,KLOR-CON) 20 MEQ tablet Take 20 mEq by mouth daily.   Yes Historical Provider, MD                                                                          Review of Systems  Constitutional: Positive for fatigue. Negative for appetite change.  HENT: Negative for congestion, postnasal drip and sore throat.   Eyes: Negative.   Respiratory: Negative for cough, chest tightness and  shortness of breath.   Cardiovascular: Positive for leg swelling. Negative for chest pain and palpitations.  Gastrointestinal: Negative.  Negative for abdominal distention and abdominal pain.  Endocrine: Negative.   Genitourinary: Negative.   Musculoskeletal: Negative for back pain and neck pain.  Skin: Negative.   Allergic/Immunologic: Negative.   Neurological: Negative for dizziness, light-headedness and headaches.  Hematological: Negative for adenopathy. Does not bruise/bleed easily.  Psychiatric/Behavioral: Negative for dysphoric mood and sleep disturbance (sleeping on 2 pillows). The patient is not nervous/anxious.        Objective:   Physical Exam  Constitutional: She appears well-developed and well-nourished.  HENT:  Head: Normocephalic and atraumatic.  Eyes: Conjunctivae are normal. Pupils are equal, round, and reactive to light.  Neck: Normal range of motion. Neck supple.  Cardiovascular: Normal rate and regular rhythm.   Pulmonary/Chest: Effort normal. She has no wheezes. She has no rales.  Abdominal: Soft. She exhibits no distension. There is no tenderness.  Musculoskeletal: She exhibits edema (2+ pitting edema in bilateral lower legs). She exhibits no tenderness.  Neurological: She is alert. She displays no tremor.  Skin: Skin is warm and dry.  Psychiatric: She has a normal mood and affect. Her behavior is normal. Thought content normal.  Nursing note and vitals reviewed.  BP 109/65   Pulse 78   Resp 18   Ht  (1.626 m)   Wt 165 lb (74.8 kg)   SpO2 100%   BMI 28.32 kg/m         Assessment & Plan:  1: Chronic heart failure with preserved ejection fraction- Patient presents with fatigue with moderate exertion (Class II). Denies feeling tired while at rest. She says that she moves around at [facility] by using her wheelchair. She thinks that she's getting weighed daily. By our scale, she has lost 5 pounds since she was last here on 05/05/16. Already has  orders in place to call for an overnight weight gain of >2 pounds or a weekly weight gain of >5 pounds. She says that she's not adding any salt to her food. She has quite a bit of swelling in her lower legs and already has TED hose on. Will increase her furosemide to  twice daily and an order was written for the facility to check a BMP in 1 week to evaluate her renal function. Currently taking potassium twice daily.  2: HTN- Blood pressure looks good today. 3: Alzheimer's dementia- Patient is unable to confirm medications and has difficulty answering some questions. Did not come with a careprovider.  Facility did not send a MAR with the patient so had to call to get it and it was reviewed after patient left.  Return here  in 3 months or sooner for any questions/problems before then.

## 2016-06-20 NOTE — Patient Instructions (Addendum)
Continue weighing daily and call for an overnight weight gain of > 2 pounds or a weekly weight gain of >5 pounds.  Increase furosemide to 40mg  twice daily. Will have the facility check BMP in 1 week.

## 2016-06-21 DIAGNOSIS — G309 Alzheimer's disease, unspecified: Secondary | ICD-10-CM

## 2016-06-21 DIAGNOSIS — F0280 Dementia in other diseases classified elsewhere without behavioral disturbance: Secondary | ICD-10-CM | POA: Insufficient documentation

## 2016-07-13 ENCOUNTER — Encounter: Payer: Self-pay | Admitting: Emergency Medicine

## 2016-07-13 ENCOUNTER — Inpatient Hospital Stay
Admission: EM | Admit: 2016-07-13 | Discharge: 2016-07-15 | DRG: 683 | Payer: Medicare Other | Attending: Internal Medicine | Admitting: Internal Medicine

## 2016-07-13 ENCOUNTER — Emergency Department: Payer: Medicare Other

## 2016-07-13 DIAGNOSIS — I5032 Chronic diastolic (congestive) heart failure: Secondary | ICD-10-CM | POA: Diagnosis present

## 2016-07-13 DIAGNOSIS — I130 Hypertensive heart and chronic kidney disease with heart failure and stage 1 through stage 4 chronic kidney disease, or unspecified chronic kidney disease: Secondary | ICD-10-CM | POA: Diagnosis present

## 2016-07-13 DIAGNOSIS — Z951 Presence of aortocoronary bypass graft: Secondary | ICD-10-CM

## 2016-07-13 DIAGNOSIS — Z90710 Acquired absence of both cervix and uterus: Secondary | ICD-10-CM | POA: Diagnosis not present

## 2016-07-13 DIAGNOSIS — Z8249 Family history of ischemic heart disease and other diseases of the circulatory system: Secondary | ICD-10-CM | POA: Diagnosis not present

## 2016-07-13 DIAGNOSIS — Z515 Encounter for palliative care: Secondary | ICD-10-CM | POA: Diagnosis present

## 2016-07-13 DIAGNOSIS — R0989 Other specified symptoms and signs involving the circulatory and respiratory systems: Secondary | ICD-10-CM

## 2016-07-13 DIAGNOSIS — E860 Dehydration: Secondary | ICD-10-CM | POA: Diagnosis present

## 2016-07-13 DIAGNOSIS — N183 Chronic kidney disease, stage 3 (moderate): Secondary | ICD-10-CM | POA: Diagnosis present

## 2016-07-13 DIAGNOSIS — N179 Acute kidney failure, unspecified: Secondary | ICD-10-CM | POA: Diagnosis not present

## 2016-07-13 DIAGNOSIS — L03115 Cellulitis of right lower limb: Secondary | ICD-10-CM | POA: Diagnosis present

## 2016-07-13 DIAGNOSIS — I959 Hypotension, unspecified: Secondary | ICD-10-CM | POA: Diagnosis present

## 2016-07-13 DIAGNOSIS — E1122 Type 2 diabetes mellitus with diabetic chronic kidney disease: Secondary | ICD-10-CM | POA: Diagnosis present

## 2016-07-13 DIAGNOSIS — F0280 Dementia in other diseases classified elsewhere without behavioral disturbance: Secondary | ICD-10-CM | POA: Diagnosis present

## 2016-07-13 DIAGNOSIS — E875 Hyperkalemia: Secondary | ICD-10-CM | POA: Diagnosis present

## 2016-07-13 DIAGNOSIS — Z66 Do not resuscitate: Secondary | ICD-10-CM | POA: Diagnosis present

## 2016-07-13 DIAGNOSIS — F0390 Unspecified dementia without behavioral disturbance: Secondary | ICD-10-CM

## 2016-07-13 DIAGNOSIS — F03C0 Unspecified dementia, severe, without behavioral disturbance, psychotic disturbance, mood disturbance, and anxiety: Secondary | ICD-10-CM

## 2016-07-13 DIAGNOSIS — G309 Alzheimer's disease, unspecified: Secondary | ICD-10-CM | POA: Diagnosis present

## 2016-07-13 DIAGNOSIS — Z888 Allergy status to other drugs, medicaments and biological substances status: Secondary | ICD-10-CM

## 2016-07-13 DIAGNOSIS — I2510 Atherosclerotic heart disease of native coronary artery without angina pectoris: Secondary | ICD-10-CM | POA: Diagnosis present

## 2016-07-13 DIAGNOSIS — M810 Age-related osteoporosis without current pathological fracture: Secondary | ICD-10-CM | POA: Diagnosis present

## 2016-07-13 DIAGNOSIS — K219 Gastro-esophageal reflux disease without esophagitis: Secondary | ICD-10-CM | POA: Diagnosis present

## 2016-07-13 DIAGNOSIS — Z9049 Acquired absence of other specified parts of digestive tract: Secondary | ICD-10-CM | POA: Diagnosis not present

## 2016-07-13 DIAGNOSIS — I248 Other forms of acute ischemic heart disease: Secondary | ICD-10-CM

## 2016-07-13 DIAGNOSIS — Z87891 Personal history of nicotine dependence: Secondary | ICD-10-CM | POA: Diagnosis not present

## 2016-07-13 DIAGNOSIS — Z79899 Other long term (current) drug therapy: Secondary | ICD-10-CM | POA: Diagnosis not present

## 2016-07-13 DIAGNOSIS — E871 Hypo-osmolality and hyponatremia: Secondary | ICD-10-CM | POA: Diagnosis present

## 2016-07-13 DIAGNOSIS — R627 Adult failure to thrive: Secondary | ICD-10-CM

## 2016-07-13 DIAGNOSIS — T68XXXA Hypothermia, initial encounter: Secondary | ICD-10-CM

## 2016-07-13 DIAGNOSIS — R609 Edema, unspecified: Secondary | ICD-10-CM

## 2016-07-13 DIAGNOSIS — Z7901 Long term (current) use of anticoagulants: Secondary | ICD-10-CM

## 2016-07-13 LAB — TROPONIN I
TROPONIN I: 0.1 ng/mL — AB (ref <0.03)
TROPONIN I: 0.09 ng/mL — AB (ref <0.03)

## 2016-07-13 LAB — COMPREHENSIVE METABOLIC PANEL
ALBUMIN: 3.2 g/dL — AB (ref 3.5–5.0)
ALT: 35 U/L (ref 14–54)
ANION GAP: 11 (ref 5–15)
AST: 61 U/L — ABNORMAL HIGH (ref 15–41)
Alkaline Phosphatase: 267 U/L — ABNORMAL HIGH (ref 38–126)
BILIRUBIN TOTAL: 1.5 mg/dL — AB (ref 0.3–1.2)
BUN: 74 mg/dL — ABNORMAL HIGH (ref 6–20)
CO2: 27 mmol/L (ref 22–32)
Calcium: 9.6 mg/dL (ref 8.9–10.3)
Chloride: 95 mmol/L — ABNORMAL LOW (ref 101–111)
Creatinine, Ser: 2.79 mg/dL — ABNORMAL HIGH (ref 0.44–1.00)
GFR calc Af Amer: 16 mL/min — ABNORMAL LOW (ref >60)
GFR calc non Af Amer: 14 mL/min — ABNORMAL LOW (ref >60)
GLUCOSE: 99 mg/dL (ref 65–99)
POTASSIUM: 6 mmol/L — AB (ref 3.5–5.1)
SODIUM: 133 mmol/L — AB (ref 135–145)
TOTAL PROTEIN: 7.4 g/dL (ref 6.5–8.1)

## 2016-07-13 LAB — BASIC METABOLIC PANEL
ANION GAP: 6 (ref 5–15)
BUN: 69 mg/dL — ABNORMAL HIGH (ref 6–20)
CHLORIDE: 97 mmol/L — AB (ref 101–111)
CO2: 29 mmol/L (ref 22–32)
CREATININE: 2.62 mg/dL — AB (ref 0.44–1.00)
Calcium: 9.4 mg/dL (ref 8.9–10.3)
GFR calc non Af Amer: 15 mL/min — ABNORMAL LOW (ref >60)
GFR, EST AFRICAN AMERICAN: 18 mL/min — AB (ref >60)
Glucose, Bld: 127 mg/dL — ABNORMAL HIGH (ref 65–99)
Potassium: 5.1 mmol/L (ref 3.5–5.1)
SODIUM: 132 mmol/L — AB (ref 135–145)

## 2016-07-13 LAB — URINALYSIS COMPLETE WITH MICROSCOPIC (ARMC ONLY)
BILIRUBIN URINE: NEGATIVE
Bacteria, UA: NONE SEEN
GLUCOSE, UA: NEGATIVE mg/dL
HGB URINE DIPSTICK: NEGATIVE
KETONES UR: NEGATIVE mg/dL
LEUKOCYTES UA: NEGATIVE
NITRITE: NEGATIVE
PH: 6 (ref 5.0–8.0)
Protein, ur: NEGATIVE mg/dL
SPECIFIC GRAVITY, URINE: 1.008 (ref 1.005–1.030)

## 2016-07-13 LAB — CBC WITH DIFFERENTIAL/PLATELET
BASOS ABS: 0.1 10*3/uL (ref 0–0.1)
BASOS PCT: 1 %
EOS ABS: 0.1 10*3/uL (ref 0–0.7)
Eosinophils Relative: 1 %
HCT: 46.1 % (ref 35.0–47.0)
HEMOGLOBIN: 14.7 g/dL (ref 12.0–16.0)
LYMPHS ABS: 1.4 10*3/uL (ref 1.0–3.6)
Lymphocytes Relative: 9 %
MCH: 25.4 pg — AB (ref 26.0–34.0)
MCHC: 31.9 g/dL — ABNORMAL LOW (ref 32.0–36.0)
MCV: 79.6 fL — ABNORMAL LOW (ref 80.0–100.0)
Monocytes Absolute: 1.7 10*3/uL — ABNORMAL HIGH (ref 0.2–0.9)
Monocytes Relative: 11 %
NEUTROS PCT: 78 %
Neutro Abs: 12.4 10*3/uL — ABNORMAL HIGH (ref 1.4–6.5)
Platelets: 310 10*3/uL (ref 150–440)
RBC: 5.8 MIL/uL — AB (ref 3.80–5.20)
RDW: 21.3 % — ABNORMAL HIGH (ref 11.5–14.5)
WBC: 15.7 10*3/uL — AB (ref 3.6–11.0)

## 2016-07-13 LAB — GLUCOSE, CAPILLARY: GLUCOSE-CAPILLARY: 117 mg/dL — AB (ref 65–99)

## 2016-07-13 LAB — PROTIME-INR
INR: 1.14
PROTHROMBIN TIME: 14.7 s (ref 11.4–15.2)

## 2016-07-13 LAB — LIPASE, BLOOD: Lipase: 20 U/L (ref 11–51)

## 2016-07-13 LAB — MRSA PCR SCREENING: MRSA BY PCR: POSITIVE — AB

## 2016-07-13 LAB — MAGNESIUM: Magnesium: 1.9 mg/dL (ref 1.7–2.4)

## 2016-07-13 LAB — LACTIC ACID, PLASMA
LACTIC ACID, VENOUS: 1.3 mmol/L (ref 0.5–1.9)
Lactic Acid, Venous: 1.4 mmol/L (ref 0.5–1.9)

## 2016-07-13 MED ORDER — ONDANSETRON HCL 4 MG PO TABS: 4.0000 mg | ORAL_TABLET | Freq: Four times a day (QID) | ORAL | Status: DC | PRN

## 2016-07-13 MED ORDER — APIXABAN 2.5 MG PO TABS
2.5000 mg | ORAL_TABLET | Freq: Two times a day (BID) | ORAL | Status: DC
  Administered 2016-07-13 – 2016-07-14 (×2): 2.5 mg via ORAL
  Filled 2016-07-13 (×2): qty 1

## 2016-07-13 MED ORDER — SODIUM CHLORIDE 0.9 % IV SOLN
1.0000 g | Freq: Once | INTRAVENOUS | Status: AC
  Administered 2016-07-13: 1 g via INTRAVENOUS
  Filled 2016-07-13: qty 10

## 2016-07-13 MED ORDER — SODIUM CHLORIDE 0.9 % IV BOLUS (SEPSIS)
500.0000 mL | INTRAVENOUS | Status: AC
  Administered 2016-07-13: 500 mL via INTRAVENOUS

## 2016-07-13 MED ORDER — INSULIN ASPART 100 UNIT/ML ~~LOC~~ SOLN: 0.0000 [IU] | Freq: Every day | SUBCUTANEOUS | Status: DC

## 2016-07-13 MED ORDER — CHLORHEXIDINE GLUCONATE CLOTH 2 % EX PADS
6.0000 | MEDICATED_PAD | Freq: Every day | CUTANEOUS | Status: DC
  Administered 2016-07-14: 6 via TOPICAL

## 2016-07-13 MED ORDER — SODIUM CHLORIDE 0.9 % IV SOLN
1.0000 g | Freq: Once | INTRAVENOUS | Status: DC
  Filled 2016-07-13: qty 10

## 2016-07-13 MED ORDER — POLYETHYLENE GLYCOL 3350 17 G PO PACK: 17.0000 g | PACK | Freq: Every day | ORAL | Status: DC | PRN

## 2016-07-13 MED ORDER — ONDANSETRON HCL 4 MG/2ML IJ SOLN: 4.0000 mg | Freq: Four times a day (QID) | INTRAMUSCULAR | Status: DC | PRN

## 2016-07-13 MED ORDER — CALCIUM GLUCONATE 10 % IV SOLN
INTRAVENOUS | Status: AC
  Filled 2016-07-13: qty 10

## 2016-07-13 MED ORDER — MAGNESIUM SULFATE 2 GM/50ML IV SOLN
2.0000 g | Freq: Once | INTRAVENOUS | Status: AC
  Administered 2016-07-13: 2 g via INTRAVENOUS
  Filled 2016-07-13: qty 50

## 2016-07-13 MED ORDER — SODIUM CHLORIDE 0.9 % IV SOLN
INTRAVENOUS | Status: DC
  Administered 2016-07-13: 22:00:00 via INTRAVENOUS

## 2016-07-13 MED ORDER — PIPERACILLIN-TAZOBACTAM 3.375 G IVPB 30 MIN
3.3750 g | Freq: Once | INTRAVENOUS | Status: AC
  Administered 2016-07-13: 3.375 g via INTRAVENOUS
  Filled 2016-07-13: qty 50

## 2016-07-13 MED ORDER — INSULIN ASPART 100 UNIT/ML ~~LOC~~ SOLN: 0.0000 [IU] | Freq: Three times a day (TID) | SUBCUTANEOUS | Status: DC

## 2016-07-13 MED ORDER — ALBUTEROL SULFATE (2.5 MG/3ML) 0.083% IN NEBU: 2.5000 mg | INHALATION_SOLUTION | RESPIRATORY_TRACT | Status: DC | PRN

## 2016-07-13 MED ORDER — GUAIFENESIN-DM 100-10 MG/5ML PO SYRP: 5.0000 mL | ORAL_SOLUTION | ORAL | Status: DC | PRN

## 2016-07-13 MED ORDER — VANCOMYCIN HCL IN DEXTROSE 1-5 GM/200ML-% IV SOLN
INTRAVENOUS | Status: AC
  Filled 2016-07-13: qty 200

## 2016-07-13 MED ORDER — MUPIROCIN 2 % EX OINT
1.0000 "application " | TOPICAL_OINTMENT | Freq: Two times a day (BID) | CUTANEOUS | Status: DC
  Administered 2016-07-13 – 2016-07-14 (×2): 1 via NASAL
  Filled 2016-07-13: qty 22

## 2016-07-13 MED ORDER — ACETAMINOPHEN 325 MG PO TABS
650.0000 mg | ORAL_TABLET | Freq: Four times a day (QID) | ORAL | Status: DC | PRN
Start: 2016-07-13 — End: 2016-07-15
  Administered 2016-07-13 – 2016-07-14 (×4): 650 mg via ORAL
  Filled 2016-07-13 (×4): qty 2

## 2016-07-13 MED ORDER — ACETAMINOPHEN 650 MG RE SUPP: 650.0000 mg | Freq: Four times a day (QID) | RECTAL | Status: DC | PRN

## 2016-07-13 MED ORDER — SODIUM CHLORIDE 0.9 % IV BOLUS (SEPSIS)
1000.0000 mL | INTRAVENOUS | Status: AC
  Administered 2016-07-13: 1000 mL via INTRAVENOUS

## 2016-07-13 MED ORDER — VANCOMYCIN HCL IN DEXTROSE 1-5 GM/200ML-% IV SOLN
1000.0000 mg | Freq: Once | INTRAVENOUS | Status: AC
  Administered 2016-07-13: 1000 mg via INTRAVENOUS
  Filled 2016-07-13: qty 200

## 2016-07-13 NOTE — H&P (Signed)
[facility]   PATIENT NAME: [name]    MR#:  [id number]  DATE OF BIRTH:  [birth year]  DATE OF ADMISSION:  07/13/2016  PRIMARY CARE PHYSICIAN: [facility]   REQUESTING/REFERRING PHYSICIAN: Loleta Roseory Forbach, MD  CHIEF COMPLAINT:   Chief Complaint  Patient presents with  . Failure To Thrive   Decreased appetite and failure to thrive HISTORY OF PRESENT ILLNESS:  [name]  is a 82 y.o. female with a known history of Hypertension, diabetes, CHF, CKD and A. fib. [facility] due to above chief complaint. She is demented and unable to provide any information. She has no complaints. According to her daughter and granddaughter, the patient has had poor oral intake and failure to thrive for the past few weeks. According to ED physician, the patient had no measurable blood pressure by EMS. She has been treated with normal saline bolus for about 2 L, blood pressure increased to 70s. She was found to have acute renal failure and potassium is 6.0. The patient's daughter mentioned that the patient has had right leg swelling and cyanosis for the past 3 weeks.  PAST MEDICAL HISTORY:   Past Medical History:  Diagnosis Date  . Alzheimer disease   . Atrial fibrillation (HCC)   . BBB (bundle branch block)    CHRONIC  LEFT BBB  . CHF (congestive heart failure) (HCC)   . Chronic diastolic CHF (congestive heart failure) (HCC)   . CKD (chronic kidney disease), stage III   . Coronary artery disease   . Diabetes mellitus    TYPE 2  . GERD (gastroesophageal reflux disease)   . Hyperlipidemia   . Hypertension   . Osteoporosis     PAST SURGICAL HISTORY:   Past Surgical History:  Procedure Laterality Date  . ABDOMINAL HYSTERECTOMY    . cabg x 5  08/10/2011   DR BARTLE  . CHOLECYSTECTOMY    . COLONOSCOPY W/ POLYPECTOMY  7/2OO8       05/2010   NEOPLASTIC  POLYPS  . CORONARY ARTERY BYPASS GRAFT  2012     SOCIAL HISTORY:   Social History  Substance Use Topics  . Smoking status: Former Games developermoker  . Smokeless tobacco: Former NeurosurgeonUser    Quit date: 11/27/1980  . Alcohol use No    FAMILY HISTORY:   Family History  Problem Relation Age of Onset  . Hypertension Mother   . Heart disease Father     DRUG ALLERGIES:   Allergies  Allergen Reactions  . Risedronate Rash    REVIEW OF SYSTEMS:   Review of Systems  Unable to perform ROS: Dementia    MEDICATIONS AT HOME:   Prior to Admission medications   Medication Sig Start Date End Date Taking? Authorizing Provider  acetaminophen (TYLENOL) 325 MG tablet Take 650 mg by mouth every 4 (four) hours as needed.   Yes Historical Provider, MD  acetaminophen (TYLENOL) 500 MG tablet Take 500 mg by mouth 2 (two) times daily.   Yes Historical Provider, MD  apixaban (ELIQUIS) 2.5 MG TABS tablet Take 2.5 mg by mouth 2 (two) times daily.   Yes Historical Provider, MD  Calcium Carb-Cholecalciferol (CALCIUM PLUS VITAMIN D3) 600-800 MG-UNIT TABS Take 1 tablet by mouth 2 (two) times daily.   Yes Historical Provider, MD  guaiFENesin-dextromethorphan (ROBITUSSIN DM) 100-10 MG/5ML syrup Take 5 mLs by mouth every 4 (four) hours as needed for cough.   Yes  Historical Provider, MD  hydrocortisone cream 1 % Apply topically as needed for itching. 06/08/16  Yes Enid Baas, MD  lisinopril (PRINIVIL,ZESTRIL) 10 MG tablet Take 1 tablet (10 mg total) by mouth at bedtime. 06/08/16  Yes Enid Baas, MD  metolazone (ZAROXOLYN) 5 MG tablet Take 5 mg by mouth daily.    Yes Historical Provider, MD  metoprolol tartrate (LOPRESSOR) 25 MG tablet Take 1 tablet (25 mg total) by mouth 2 (two) times daily. 04/27/16  Yes Enedina Finner, MD  oxyCODONE-acetaminophen (PERCOCET/ROXICET) 5-325 MG tablet Take 1 tablet by mouth every 6 (six) hours as needed for severe pain.   Yes Historical Provider, MD  potassium chloride SA (K-DUR,KLOR-CON) 20 MEQ tablet Take 20 mEq by mouth daily.    Yes Historical Provider, MD  spironolactone (ALDACTONE) 25 MG tablet Take 25 mg by mouth daily.   Yes Historical Provider, MD  torsemide (DEMADEX) 20 MG tablet Take 20 mg by mouth 2 (two) times daily. 07/10/16 07/17/16 Yes Historical Provider, MD  traMADol (ULTRAM) 50 MG tablet Take 50 mg by mouth every 4 (four) hours as needed.   Yes Historical Provider, MD  acetaminophen (TYLENOL) 500 MG tablet Take 500 mg by mouth 2 (two) times daily.    Historical Provider, MD  furosemide (LASIX) 20 MG tablet Take 40mg  in the morning and 20mg  in the evening. Patient not taking: Reported on 07/13/2016 06/13/16   Iran Ouch, MD  polyethylene glycol (MIRALAX / GLYCOLAX) packet Take 17 g by mouth daily as needed.     Historical Provider, MD  potassium chloride (K-DUR) 10 MEQ tablet Take 1 tablet (10 mEq total) by mouth daily. Patient not taking: Reported on 07/13/2016 06/08/16   Enid Baas, MD      VITAL SIGNS:  Blood pressure (!) 78/57, pulse 87, temperature 97.4 F (36.3 C), temperature source Rectal, resp. rate 16, weight 149 lb (67.6 kg), SpO2 100 %.  PHYSICAL EXAMINATION:  Physical Exam  Constitutional: She is well-developed, well-nourished, and in no distress.  HENT:  Head: Normocephalic.  Very dry oral mucosa.  Eyes: Conjunctivae and EOM are normal. Pupils are equal, round, and reactive to light.  Neck: Normal range of motion. Neck supple. No JVD present.  Cardiovascular: Normal rate, regular rhythm and normal heart sounds.  Exam reveals no gallop.   No murmur heard. Pulmonary/Chest: Effort normal and breath sounds normal. No respiratory distress. She has no wheezes. She has no rales.  Abdominal: Soft. Bowel sounds are normal. She exhibits no distension. There is no tenderness.  Musculoskeletal: She exhibits edema and tenderness.  swelling and cyanosis in right lower extremity   Neurological: She is alert. No cranial nerve deficit.  Skin: Skin is dry.  Psychiatric:  Demented.    LABORATORY PANEL:   CBC  Recent Labs Lab 07/13/16 1504  WBC 15.7*  HGB 14.7  HCT 46.1  PLT 310   ------------------------------------------------------------------------------------------------------------------  Chemistries   Recent Labs Lab 07/13/16 1504  NA 133*  K 6.0*  CL 95*  CO2 27  GLUCOSE 99  BUN 74*  CREATININE 2.79*  CALCIUM 9.6  MG 1.9  AST 61*  ALT 35  ALKPHOS 267*  BILITOT 1.5*   ------------------------------------------------------------------------------------------------------------------  Cardiac Enzymes  Recent Labs Lab 07/13/16 1504  TROPONINI 0.10*   ------------------------------------------------------------------------------------------------------------------  RADIOLOGY:  Dg Chest Port 1 View  Result Date: 07/13/2016 CLINICAL DATA:  Initial evaluation for acute failure to thrive, concern for sepsis. History of AFib, CHF, coronary artery disease. Former smoker. EXAM: PORTABLE CHEST 1  VIEW COMPARISON:  Prior radiograph from 09/05/2016. FINDINGS: Median sternotomy wires underlying CABG markers and surgical clips present. Cardiomegaly stable. Mediastinal silhouette within normal limits. Atheromatous plaque present within the aortic arch. Lungs normally inflated. Chronic coarsening of the interstitial markings is similar. Mild scarring within the left mid lung, also similar. No overt pulmonary edema or pleural effusion. No pneumothorax. No consolidative airspace disease. No acute osseous abnormality. IMPRESSION: 1. No active cardiopulmonary disease identified. 2. Stable cardiomegaly without pulmonary edema. 3. Aortic atherosclerosis. Electronically Signed   By: Rise MuBenjamin  McClintock M.D.   On: 07/13/2016 15:29      IMPRESSION AND PLAN:  The patient will be admitted to medical floor due to hypotension and acute renal failure.  Hypotension Hold Lasix, spironolactone, metolazone, Lopressor and lisinopril. Continue IV fluid support and  monitor vital sign closely.  Acute renal failure on CKD. Hold Lasix, spironolactone, metolazone and lisinopril. Continue IV fluid support. Follow up BMP.  Hyperkalemia. Hold potassium supplements. Continue V fluid, follow-up BMP.   Hyponatremia. Continue normal saline IV and follow-up BMP Leukocytosis, possible due to dehydration, follow-up CBC. Elevated troponin, possible due to acute renal failure. Right leg swelling. Get venous ultrasounds to rule out DVT. Diabetes. Start sliding scale.  All the records are reviewed and case discussed with ED provider. Management plans discussed with the patient's daughter and granddaughter and they are in agreement.  CODE STATUS: DO NOT RESUSCITATE  TOTAL TIME TAKING CARE OF THIS PATIENT: 58 minutes.    Shaune Pollackhen, Martin Smeal M.D on 07/13/2016 at 5:33 PM  Between 7am to 6pm - Pager - [phone]  After 6pm go to [email or web address] - Social research officer, governmentpassword EPAS [facility]  [facility]  Office  [phone]  CC: Primary care physician; [facility]   Note: This dictation was prepared with Dragon dictation along with smaller phrase technology. Any transcriptional errors that result from this process are unintentional.

## 2016-07-13 NOTE — Progress Notes (Signed)
Md notified of pt c/o pain in right leg. PRN tylenol ineffective. Orders to give extra 650mg  dose of tylenol now per dr. Emmit PomfretHugelmeyer. MD does not want to give narcotics or nsaids at this time due to low bp and impaired renal function. Will give tylenol and continue to monitor.

## 2016-07-13 NOTE — ED Triage Notes (Signed)
Pt sent for failure to thrive and will not eat good meal per EMS report from nursing home.  Hypotensive 80s/40s with ems. 96.8 rectal per ems

## 2016-07-13 NOTE — ED Provider Notes (Signed)
[facility] Emergency Department Provider Note  ____________________________________________   First MD Initiated Contact with Patient 07/13/16 1456     (approximate)  I have reviewed the triage vital signs and the nursing notes.   HISTORY  Chief Complaint Failure To Thrive  The patient has advanced dementia and is unable to provide history nor review of systems.  HPI [name] is a 82 y.o. female is a resident of [facility] and has severe Alzheimer's dementia.  She presents by EMS for evaluation of generalized weakness, hypotension, not eating or drinking well recently, and general failure to thrive.  Details provided to the paramedics were very limited; the nurse who talked with them explaining that she had been on vacation for about 2 weeks so she is not sure how long the patient has been getting worse.  Family is reportedly on the way to the emergency department and should be able to provide additional information.  The paramedics report that when they arrived the patient had no measurable blood pressure although she was alert and can answer simple questions.  She had a temperature of less than 97.  She received 750 mL of normal saline as a bolus in route to the hospital which has brought her blood pressure up to about 76/56.  She remains hypothermic by rectal temp upon arrival in the emergency department.  She does not appear to be in any distressand denies that anything is hurting but her overall condition is severe.  He saw the patient immediately upon her arrival in the emergency department and initiated code sepsis protocol.   Past Medical History:  Diagnosis Date  . Alzheimer disease   . Atrial fibrillation (HCC)   . BBB (bundle branch block)    CHRONIC  LEFT BBB  . CHF (congestive heart failure) (HCC)   . Chronic diastolic CHF (congestive heart failure) (HCC)   . CKD (chronic kidney disease), stage III   . Coronary artery disease   .  Diabetes mellitus    TYPE 2  . GERD (gastroesophageal reflux disease)   . Hyperlipidemia   . Hypertension   . Osteoporosis     Patient Active Problem List   Diagnosis Date Noted  . Alzheimer's dementia 06/21/2016  . Acute on chronic diastolic (congestive) heart failure (HCC) 06/05/2016  . Skin lesion on examination 08/06/2015  . Chronic diastolic CHF (congestive heart failure) (HCC) 05/11/2015  . Vitamin B 12 deficiency 05/11/2015  . Type 2 diabetes mellitus without complication (HCC)   . Hypertension   . Hyperlipidemia   . Osteoporosis   . GERD (gastroesophageal reflux disease)   . BBB (bundle branch block)     Past Surgical History:  Procedure Laterality Date  . ABDOMINAL HYSTERECTOMY    . cabg x 5  08/10/2011   DR BARTLE  . CHOLECYSTECTOMY    . COLONOSCOPY W/ POLYPECTOMY  7/2OO8       05/2010   NEOPLASTIC  POLYPS  . CORONARY ARTERY BYPASS GRAFT  2012    Prior to Admission medications   Medication Sig Start Date End Date Taking? Authorizing Provider  acetaminophen (TYLENOL) 325 MG tablet Take 650 mg by mouth every 4 (four) hours as needed.   Yes Historical Provider, MD  acetaminophen (TYLENOL) 500 MG tablet Take 500 mg by mouth 2 (two) times daily.   Yes Historical Provider, MD  apixaban (ELIQUIS) 2.5 MG TABS tablet Take 2.5 mg by mouth 2 (two) times daily.   Yes Historical Provider, MD  Calcium Carb-Cholecalciferol (CALCIUM PLUS VITAMIN D3) 600-800 MG-UNIT TABS Take 1 tablet by mouth 2 (two) times daily.   Yes Historical Provider, MD  guaiFENesin-dextromethorphan (ROBITUSSIN DM) 100-10 MG/5ML syrup Take 5 mLs by mouth every 4 (four) hours as needed for cough.   Yes Historical Provider, MD  hydrocortisone cream 1 % Apply topically as needed for itching. 06/08/16  Yes Enid Baas, MD  lisinopril (PRINIVIL,ZESTRIL) 10 MG tablet Take 1 tablet (10 mg total) by mouth at bedtime. 06/08/16  Yes Enid Baas, MD  metolazone (ZAROXOLYN) 5 MG tablet Take 5 mg by mouth daily.     Yes Historical Provider, MD  metoprolol tartrate (LOPRESSOR) 25 MG tablet Take 1 tablet (25 mg total) by mouth 2 (two) times daily. 04/27/16  Yes Enedina Finner, MD  oxyCODONE-acetaminophen (PERCOCET/ROXICET) 5-325 MG tablet Take 1 tablet by mouth every 6 (six) hours as needed for severe pain.   Yes Historical Provider, MD  potassium chloride SA (K-DUR,KLOR-CON) 20 MEQ tablet Take 20 mEq by mouth daily.   Yes Historical Provider, MD  spironolactone (ALDACTONE) 25 MG tablet Take 25 mg by mouth daily.   Yes Historical Provider, MD  torsemide (DEMADEX) 20 MG tablet Take 20 mg by mouth 2 (two) times daily. 07/10/16 07/17/16 Yes Historical Provider, MD  traMADol (ULTRAM) 50 MG tablet Take 50 mg by mouth every 4 (four) hours as needed.   Yes Historical Provider, MD  acetaminophen (TYLENOL) 500 MG tablet Take 500 mg by mouth 2 (two) times daily.    Historical Provider, MD  furosemide (LASIX) 20 MG tablet Take 40mg  in the morning and 20mg  in the evening. Patient not taking: Reported on 07/13/2016 06/13/16   Iran Ouch, MD  polyethylene glycol (MIRALAX / GLYCOLAX) packet Take 17 g by mouth daily as needed.     Historical Provider, MD  potassium chloride (K-DUR) 10 MEQ tablet Take 1 tablet (10 mEq total) by mouth daily. Patient not taking: Reported on 07/13/2016 06/08/16   Enid Baas, MD    Allergies Risedronate  Family History  Problem Relation Age of Onset  . Hypertension Mother   . Heart disease Father     Social History Social History  Substance Use Topics  . Smoking status: Former Games developer  . Smokeless tobacco: Former Neurosurgeon    Quit date: 11/27/1980  . Alcohol use No    Review of Systems Critical care/level V caveat - the patient has advanced dementia and cannot provide a review of systems  ____________________________________________   PHYSICAL EXAM:  VITAL SIGNS: ED Triage Vitals  Enc Vitals Group     BP 07/13/16 1454 (!) 76/54     Pulse Rate 07/13/16 1454 93     Resp  07/13/16 1454 18     Temp --      Temp src --      SpO2 07/13/16 1454 100 %     Weight 07/13/16 1451 149 lb (67.6 kg)     Height --      Head Circumference --      Peak Flow --      Pain Score --      Pain Loc --      Pain Edu? --      Excl. in GC? --     Constitutional: Alert And oriented to herself only.  Able to answer simple questions.  Elderly but in no acute distress Eyes: Conjunctivae are normal. PERRL. EOMI. Head: Atraumatic. Nose: No congestion/rhinnorhea. Mouth/Throat: Mucous membranes are dry. Neck: No stridor.  No meningeal signs.   Cardiovascular: Normal rate, irregular rhythm. Poor peripheral circulation with cool extremities bilaterally and significantly decreased capillary refill.   Respiratory: Normal respiratory effort.  No retractions. Lungs CTAB. Gastrointestinal: Soft and nontender. No distention.  Musculoskeletal: Trace pitting edema in LLE, decreased cap refill and decrease peripheral pulses, otherwise unremarkable.  There is a chronic-appearing wounds/eschar on the right lower extremity on the lateral side that does not appear to be acutely infected.  Her entire foot is edematous, reddish purple in color, with significantly decreased capillary refill.  Additionally she has petechiae all over her right lower extremity below the knee, but these findings do not appear to be acute.  The limb is nontender to palpation and she is able to wiggle her toes. Neurologic:  Normal speech and language. No gross focal neurologic deficits are appreciated.  Skin:  Skin is cool and dry.  Petechiae on right lower extremity as described above.   ____________________________________________   LABS (all labs ordered are listed, but only abnormal results are displayed)  Labs Reviewed  COMPREHENSIVE METABOLIC PANEL - Abnormal; Notable for the following:       Result Value   Sodium 133 (*)    Potassium 6.0 (*)    Chloride 95 (*)    BUN 74 (*)    Creatinine, Ser 2.79 (*)     Albumin 3.2 (*)    AST 61 (*)    Alkaline Phosphatase 267 (*)    Total Bilirubin 1.5 (*)    GFR calc non Af Amer 14 (*)    GFR calc Af Amer 16 (*)    All other components within normal limits  TROPONIN I - Abnormal; Notable for the following:    Troponin I 0.10 (*)    All other components within normal limits  CBC WITH DIFFERENTIAL/PLATELET - Abnormal; Notable for the following:    WBC 15.7 (*)    RBC 5.80 (*)    MCV 79.6 (*)    MCH 25.4 (*)    MCHC 31.9 (*)    RDW 21.3 (*)    Neutro Abs 12.4 (*)    Monocytes Absolute 1.7 (*)    All other components within normal limits  URINALYSIS COMPLETEWITH MICROSCOPIC ([facility] ONLY) - Abnormal; Notable for the following:    Color, Urine YELLOW (*)    APPearance CLEAR (*)    Squamous Epithelial / LPF 0-5 (*)    All other components within normal limits  CULTURE, BLOOD (ROUTINE X 2)  CULTURE, BLOOD (ROUTINE X 2)  URINE CULTURE  LACTIC ACID, PLASMA  LIPASE, BLOOD  PROTIME-INR  LACTIC ACID, PLASMA  MAGNESIUM   ____________________________________________  EKG  ED ECG REPORT I, Mc Bloodworth, the attending physician, personally viewed and interpreted this ECG.   Date: 07/13/2016  EKG Time: 17:04  Rate: 96  Rhythm: atrial fibrillation, rate 96  Axis: Normal  Intervals:left bundle branch block  ST&T Change: No significant change from prior EKG from last month also showing left bundle branch block  ____________________________________________  RADIOLOGY   Dg Chest Port 1 View  Result Date: 07/13/2016 CLINICAL DATA:  Initial evaluation for acute failure to thrive, concern for sepsis. History of AFib, CHF, coronary artery disease. Former smoker. EXAM: PORTABLE CHEST 1 VIEW COMPARISON:  Prior radiograph from 09/05/2016. FINDINGS: Median sternotomy wires underlying CABG markers and surgical clips present. Cardiomegaly stable. Mediastinal silhouette within normal limits. Atheromatous plaque present within the aortic arch. Lungs normally  inflated. Chronic coarsening of the interstitial markings is similar.  Mild scarring within the left mid lung, also similar. No overt pulmonary edema or pleural effusion. No pneumothorax. No consolidative airspace disease. No acute osseous abnormality. IMPRESSION: 1. No active cardiopulmonary disease identified. 2. Stable cardiomegaly without pulmonary edema. 3. Aortic atherosclerosis. Electronically Signed   By: Rise MuBenjamin  McClintock M.D.   On: 07/13/2016 15:29    ____________________________________________   PROCEDURES  Procedure(s) performed:   .Critical Care Performed by: Loleta RoseFORBACH, Marca Gadsby Authorized by: Loleta RoseFORBACH, Triston Lisanti   Critical care provider statement:    Critical care time (minutes):  60   Critical care time was exclusive of:  Separately billable procedures and treating other patients   Critical care was necessary to treat or prevent imminent or life-threatening deterioration of the following conditions:  Sepsis   Critical care was time spent personally by me on the following activities:  Development of treatment plan with patient or surrogate, discussions with consultants, evaluation of patient's response to treatment, examination of patient, obtaining history from patient or surrogate, ordering and performing treatments and interventions, ordering and review of laboratory studies, ordering and review of radiographic studies, pulse oximetry, re-evaluation of patient's condition and review of old charts      Critical Care performed: Yes, see critical care procedure note(s) ____________________________________________   INITIAL IMPRESSION / ASSESSMENT AND PLAN / ED COURSE  Pertinent labs & imaging results that were available during my care of the patient were reviewed by me and considered in my medical decision making (see chart for details).  Patient is in no acute distress but her vital signs are critical.  After her 750 mL of fluid in route to the hospital she is still severely  hypotensive which is evident and physical exam as well with cool and nearly pulseless extremities.  I quickly reviewed past medical records from Dr. Kirke CorinArida, her cardiologist, who comments that she has a severely decreased ejection fraction of 20% and is prone to volume overload.  We must be cautious with fluid resuscitation.  She received 750 mL in route to the hospital side ordered 30 mL/kg of normal saline as per protocol minus the fluid she R he received prior to arrival; as a result she will receive 1.5 L in the emergency department and that we will reassess her volume status.  As of right now there is no indication that she is DO NOT RESUSCITATE/DO NOT RESUSCITATE but family is on the way.  She is in no danger of losing her airway at this point.  I will treat with empiric antibiotics as soon as an in and out catheterization was performed to obtain urine and we are proceeding with code sepsis workup.  Clinical Course  Comment By Time  The patient's workup is notable for demand ischemia with an elevated troponin of 0.1, leukocytosis of 15.7, no evidence of UTI, no evidence of pneumonia, and evidence of dehydration in the form of hyperkalemia and acute renal failure.  I will give her some magnesium and calcium but hold off on aggressively treating the potassium because of believe it will come down significantly after her multiple liters of fluid.  I will discuss the case with the hospitalist.  I did have an extensive conversation with the patient's daughter/power of attorney.  After I explained that she has multiple organ systems that are involved, and given her advanced dementia, the patient's daughter agreed that she should be DO NOT RESUSCITATE/DO NOT RESUSCITATE.  I filled out the [employer] form in my own name.  We also discussed advanced invasive treatment  such as a central line if her blood pressure drops and the patient's daughter does not want her to have any additional invasive treatment.  She  understands that the patient may improve on her own with fluids and antibiotics or she may worsen, but believes that no invasive/aggressive treatments and being DNR/DNI is in accordance with what the patient's wishes would be if she did not have advanced dementia Loleta Rose, MD 08/17 1707    ____________________________________________  FINAL CLINICAL IMPRESSION(S) / ED DIAGNOSES  Final diagnoses:  Acute renal failure, unspecified acute renal failure type (HCC)  Hypothermia, initial encounter  Demand ischemia (HCC)  Failure to thrive in adult  Hyperkalemia, diminished renal excretion  Dehydration  Advanced dementia  DNR (do not resuscitate)     MEDICATIONS GIVEN DURING THIS VISIT:  Medications  vancomycin (VANCOCIN) IVPB 1000 mg/200 mL premix (1,000 mg Intravenous New Bag/Given 07/13/16 1655)  calcium gluconate 1 g in sodium chloride 0.9 % 100 mL IVPB (not administered)  magnesium sulfate IVPB 2 g 50 mL (not administered)  sodium chloride 0.9 % bolus 1,000 mL (0 mLs Intravenous Stopped 07/13/16 1649)  sodium chloride 0.9 % bolus 500 mL (500 mLs Intravenous New Bag/Given 07/13/16 1655)  piperacillin-tazobactam (ZOSYN) IVPB 3.375 g (0 g Intravenous Stopped 07/13/16 1649)     NEW OUTPATIENT MEDICATIONS STARTED DURING THIS VISIT:  New Prescriptions   No medications on file      Note:  This document was prepared using Dragon voice recognition software and may include unintentional dictation errors.    Loleta Rose, MD 07/13/16 1714

## 2016-07-13 NOTE — Progress Notes (Signed)
Chaplain rounded the unit to provide a compassionate presence and spiritual support. Patient receiving guest. Will return. Jefm PettyChaplain Bexleigh Theriault [phone]

## 2016-07-13 NOTE — ED Notes (Signed)
Pharm notified of calcium gluco IVPB, will send to ED

## 2016-07-13 NOTE — ED Notes (Signed)
Put on bedpan.

## 2016-07-14 LAB — BASIC METABOLIC PANEL
ANION GAP: 9 (ref 5–15)
BUN: 67 mg/dL — ABNORMAL HIGH (ref 6–20)
CALCIUM: 9.4 mg/dL (ref 8.9–10.3)
CO2: 27 mmol/L (ref 22–32)
Chloride: 97 mmol/L — ABNORMAL LOW (ref 101–111)
Creatinine, Ser: 2.08 mg/dL — ABNORMAL HIGH (ref 0.44–1.00)
GFR calc Af Amer: 24 mL/min — ABNORMAL LOW (ref >60)
GFR, EST NON AFRICAN AMERICAN: 20 mL/min — AB (ref >60)
GLUCOSE: 118 mg/dL — AB (ref 65–99)
POTASSIUM: 5.1 mmol/L (ref 3.5–5.1)
SODIUM: 133 mmol/L — AB (ref 135–145)

## 2016-07-14 LAB — CBC
HEMATOCRIT: 45.8 % (ref 35.0–47.0)
HEMOGLOBIN: 14.8 g/dL (ref 12.0–16.0)
MCH: 25.6 pg — ABNORMAL LOW (ref 26.0–34.0)
MCHC: 32.3 g/dL (ref 32.0–36.0)
MCV: 79.2 fL — ABNORMAL LOW (ref 80.0–100.0)
Platelets: 253 10*3/uL (ref 150–440)
RBC: 5.78 MIL/uL — ABNORMAL HIGH (ref 3.80–5.20)
RDW: 20.8 % — AB (ref 11.5–14.5)
WBC: 13.2 10*3/uL — AB (ref 3.6–11.0)

## 2016-07-14 LAB — GLUCOSE, CAPILLARY
GLUCOSE-CAPILLARY: 126 mg/dL — AB (ref 65–99)
GLUCOSE-CAPILLARY: 154 mg/dL — AB (ref 65–99)
Glucose-Capillary: 151 mg/dL — ABNORMAL HIGH (ref 65–99)

## 2016-07-14 LAB — URINE CULTURE: Culture: NO GROWTH

## 2016-07-14 MED ORDER — PIPERACILLIN-TAZOBACTAM 3.375 G IVPB
3.3750 g | Freq: Two times a day (BID) | INTRAVENOUS | Status: DC
  Administered 2016-07-14: 3.375 g via INTRAVENOUS
  Filled 2016-07-14 (×2): qty 50

## 2016-07-14 MED ORDER — VANCOMYCIN HCL IN DEXTROSE 750-5 MG/150ML-% IV SOLN
750.0000 mg | INTRAVENOUS | Status: DC
Start: 2016-07-15 — End: 2016-07-14

## 2016-07-14 MED ORDER — SODIUM CHLORIDE 0.9 % IV BOLUS (SEPSIS)
250.0000 mL | Freq: Once | INTRAVENOUS | Status: AC
  Administered 2016-07-14: 250 mL via INTRAVENOUS

## 2016-07-14 MED ORDER — LORAZEPAM 2 MG/ML IJ SOLN
1.0000 mg | INTRAMUSCULAR | Status: DC
  Administered 2016-07-15: 1 mg via INTRAVENOUS
  Filled 2016-07-14: qty 1

## 2016-07-14 MED ORDER — MORPHINE SULFATE (PF) 2 MG/ML IV SOLN
1.0000 mg | INTRAVENOUS | Status: DC | PRN
  Administered 2016-07-14 – 2016-07-15 (×8): 1 mg via INTRAVENOUS
  Filled 2016-07-14 (×8): qty 1

## 2016-07-14 NOTE — Progress Notes (Signed)
Pharmacy Antibiotic Note  [name] is a 82 y.o. female admitted on 07/13/2016 with sepsis.  Pharmacy has been consulted for Zosyn and vancomycin dosing.  Plan: 1. Zosyn 3.375 gm IV Q12H EI 2. Vancomycin 1 gm IV x 1 in ED followed in 36 hours (stacked dosing) by vancomycin 750 mg IV Q48H predicted trough 17 mcg/mL. Pharmacy will continue to follow and adjust as needed to maintain trough 15 to 20 mcg/mL.   Vd 39.6 L, Ke  0.016 hr-1, T1/2 44.4 hr  Height: 5\' 5"  (165.1 cm) Weight: 146 lb 12.8 oz (66.6 kg) IBW/kg (Calculated) : 57  Temp (24hrs), Avg:97.4 F (36.3 C), Min:97.4 F (36.3 C), Max:97.4 F (36.3 C)   Recent Labs Lab 07/13/16 1504 07/13/16 2021  WBC 15.7*  --   CREATININE 2.79* 2.62*  LATICACIDVEN 1.3 1.4    Estimated Creatinine Clearance: 13.6 mL/min (by C-G formula based on SCr of 2.62 mg/dL).    Allergies  Allergen Reactions  . Risedronate Rash     Thank you for allowing pharmacy to be a part of this patient's care.  Carola FrostNathan A Seaton Hofmann, Pharm.D., BCPS Clinical Pharmacist 07/14/2016 12:09 AM

## 2016-07-14 NOTE — Progress Notes (Signed)
Notified Dr. Cherlynn KaiserSainani via telephone that bp following 250 cc bolus in 82/46, per MD as long as MAP is greater than 70 she does not need further intervention, will continue to monitor, pt asymptomatic at this time.

## 2016-07-14 NOTE — Clinical Social Work Note (Signed)
Clinical Social Work Assessment  Patient Details  Name: [name] MRN: [id number] Date of Birth: 01-21-1926  Date of referral:  07/14/16               Reason for consult:  Discharge Planning                Permission sought to share information with:  Family Supports Permission granted to share information::     Name::        Agency::     Relationship::   Enid Derry- Daughter)  Sport and exercise psychologist Information:     Housing/Transportation Living arrangements for the past 2 months:  Single Family Home Source of Information:  Adult Children, Patient Patient Interpreter Needed:  None Criminal Activity/Legal Involvement Pertinent to Current Situation/Hospitalization:  No - Comment as needed Significant Relationships:  Adult Children Enid Derry- Daughter) Lives with:  Facility Resident Oceanographer) Do you feel safe going back to the place where you live?  Yes Need for family participation in patient care:  Yes (Comment) Enid Derry- Daughter)  Care giving concerns:  Patient's family has decided to make her full comfort care.    Social Worker assessment / plan:  CSW is very familiar with this family due to repeated hospitalizations. CSW met with patient, daughter and son-in-law at bedside. CSW reintroduced herself and her role. Patient's family thanked CSW for assisting with getting patient a bed at [facility]. CSW discussed making patient comfort care. Patient's daughter stated that she feels it's best. Stated she'd like for patient to go to [facility]. Granted CSW verbal permission to make referral to [facility]. She informed CSW that there isn't a bed available at the time but she will let CSW know when one is. CSW informed MD Sainani of above.   Employment status:  Retired Nurse, adult PT Recommendations:  Not assessed at this time Information / Referral to community resources:  Other (Comment Required) ([place])  Patient/Family's Response to care:  Patient's family is in agreement for [facility].   Patient/Family's Understanding of and Emotional Response to Diagnosis, Current Treatment, and Prognosis:  Patient's family reports they understand patient's  Diagnosis, Current Treatment, and Prognosis and stated they feel Hospice Home is the best option.   Emotional Assessment Appearance:  Appears older than stated age Attitude/Demeanor/Rapport:   (None) Affect (typically observed):  Accepting, Calm Orientation:  Oriented to Self, Oriented to Situation Alcohol / Substance use:  Not Applicable Psych involvement (Current and /or in the community):  No (Comment)  Discharge Needs  Concerns to be addressed:  Discharge Planning Concerns Readmission within the last 30 days:  No Current discharge risk:  Chronically ill Barriers to Discharge:  Continued Medical Work up   [employer], [place] 07/14/2016, 3:16 PM

## 2016-07-14 NOTE — Progress Notes (Addendum)
New hospice home referral received from [facility]. Ms. Crisp is an 82 year old woman with a  PMH including Diastolic CHF, Alzheimer's dementia, A-fib, CKD, HLD, HTN and DMII,. She has had 4 hospitalizations and 1 ED visit in the past 6 months. She was brought to the [facility] ED for evaluation of hypotension, right leg swelling and poor po intake for the past few weeks. She required a saline bolus in the ED and also was a found to have elevated potasium at 6.0.  Patient has required further fluid boluses d/t hypotension since admission. Attending physician Dr. Verdell Carmine spoke with her family today and discussed her poor prognosis and repeated hospitalizations. Family has chosen to focus on comfort and wishes her to transfer to the hospice home for end of life care. Writer met in the patient's room with her daughter Enid Derry and son in law Chrissie Noa to initiate education related to hospice services, philosophy and team approach to care with good understanding voiced. Writer informed them that there are currently no beds available at the hospice home.  Patient was seen lying in bed, alert when spoken to. She has required 3 doses of IV morphine for pain, which is in her right lower leg, which is red and edematous with weeping. There is a large fluid filled blister to the top of her right foot.  Patient information faxed to hospice referral. Hospital care team made aware there are currently no beds available. Hospice Home to contact [facility] when bed becomes available, staff and family aware. Thank you. Flo Shanks RN, BSN, [facility] [place], hospital Liaison [phone]

## 2016-07-14 NOTE — Progress Notes (Signed)
[employer] [facility] - [place] [facility]   PATIENT NAME: [name]    MR#:  [id number]  DATE OF BIRTH:  1926-03-07  SUBJECTIVE:   Patient admitted to the hospital due to hypotension, failure to thrive and weakness. Also noted to have right lower extremity swelling and cool right lower extremity.  REVIEW OF SYSTEMS:    Review of Systems  Unable to perform ROS: Mental acuity   DRUG ALLERGIES:   Allergies  Allergen Reactions  . Risedronate Rash    VITALS:  Blood pressure (!) 82/46, pulse 74, temperature 97.5 F (36.4 C), temperature source Oral, resp. rate 18, height 5\' 5"  (1.651 m), weight 66.6 kg (146 lb 12.8 oz), SpO2 100 %.  PHYSICAL EXAMINATION:   Physical Exam  GENERAL:  82 y.o.-year-old patient lying in the bed Lethargic but responsive.   EYES: Pupils equal, round, reactive to light. No scleral icterus. Extraocular muscles intact.  HEENT: Head atraumatic, normocephalic. Oropharynx and nasopharynx clear. Dry Oral mucosa NECK:  Supple, no jugular venous distention. No thyroid enlargement, no tenderness.  LUNGS: Normal breath sounds bilaterally, no wheezing, rales, rhonchi. No use of accessory muscles of respiration.  CARDIOVASCULAR: S1, S2 normal. No murmurs, rubs, or gallops.  ABDOMEN: Soft, nontender, nondistended. Bowel sounds present. No organomegaly or mass.  EXTREMITIES: No cyanosis, clubbing or edema b/l. RLE swelling with a fluid filled blister in foot. Painful to touch but cool RLE.     NEUROLOGIC: Cranial nerves II through XII are intact. No focal Motor or sensory deficits b/l.  Globally weak.  Lethargic. PSYCHIATRIC: The patient is alert and oriented x 1. Encephalopathic.   SKIN: No obvious rash, lesion, or ulcer. RLE fluid filled blister   LABORATORY PANEL:   CBC  Recent Labs Lab 07/14/16 0609  WBC 13.2*  HGB 14.8  HCT 45.8  PLT 253    ------------------------------------------------------------------------------------------------------------------  Chemistries   Recent Labs Lab 07/13/16 1504  07/14/16 0609  NA 133*  < > 133*  K 6.0*  < > 5.1  CL 95*  < > 97*  CO2 27  < > 27  GLUCOSE 99  < > 118*  BUN 74*  < > 67*  CREATININE 2.79*  < > 2.08*  CALCIUM 9.6  < > 9.4  MG 1.9  --   --   AST 61*  --   --   ALT 35  --   --   ALKPHOS 267*  --   --   BILITOT 1.5*  --   --   < > = values in this interval not displayed. ------------------------------------------------------------------------------------------------------------------  Cardiac Enzymes  Recent Labs Lab 07/13/16 2021  TROPONINI 0.09*   ------------------------------------------------------------------------------------------------------------------  RADIOLOGY:  Dg Chest Port 1 View  Result Date: 07/13/2016 CLINICAL DATA:  Initial evaluation for acute failure to thrive, concern for sepsis. History of AFib, CHF, coronary artery disease. Former smoker. EXAM: PORTABLE CHEST 1 VIEW COMPARISON:  Prior radiograph from 09/05/2016. FINDINGS: Median sternotomy wires underlying CABG markers and surgical clips present. Cardiomegaly stable. Mediastinal silhouette within normal limits. Atheromatous plaque present within the aortic arch. Lungs normally inflated. Chronic coarsening of the interstitial markings is similar. Mild scarring within the left mid lung, also similar. No overt pulmonary edema or pleural effusion. No pneumothorax. No consolidative airspace disease. No acute osseous abnormality. IMPRESSION: 1. No active cardiopulmonary disease identified. 2. Stable cardiomegaly without pulmonary edema. 3. Aortic atherosclerosis. Electronically Signed   By: Rise MuBenjamin  McClintock M.D.   On: 07/13/2016 15:29  ASSESSMENT AND PLAN:   82 year old female with past medical history of osteoporosis, hypertension, hyperlipidemia, GERD, diabetes, chronic kidney disease  stage III, chronic diastolic CHF, Alzheimer's, atrial fibrillation, history of CHF who presented to the hospital due to generalized weakness, hypotension and failure to thrive.  1.Acute on Chronic Renal failure -  Improving w/ IV fluids.  2. RLE blister, swelling - possible PVD - cont. Supportive care with pain control 3. HYperkalemia - improved w/ IV fluids.  4. Dementia 5. Adult Failure to thrive 6. Hx of diastolic CHF 7. Chronic. A. Fib.   Patient was admitted to the hospital and started on IV fluids for the renal failure, IV antibiotics for a possible right lower extremity infection/cellulitis, and maintain her regular medications. She remained quite hypotensive and has poor by mouth intake and has had frequent hospitalizations over the past 2 months. -I had an extensive discussion with the patient's daughter and family about her goals of care and her poor prognosis given her recurrent hospitalizations. Patient was already a DO NOT RESUSCITATE, and I discussed with the family about making her possible comfort care only. -Upon further family discussions they have agreed to make the patient comfortable and therefore patient was placed under comfort care only, and Ativan and morphine as needed was ordered.     All the records are reviewed and case discussed with Care Management/Social Worker. Management plans discussed with the patient, family and they are in agreement.  CODE STATUS: DNR/COmfort CARE ONLY  DVT Prophylaxis: None  TOTAL TIME TAKING CARE OF THIS PATIENT: 40 minutes.   POSSIBLE D/C IN 1-2 DAYS, DEPENDING ON CLINICAL CONDITION.   Houston SirenSAINANI,VIVEK J M.D on 07/14/2016 at 2:31 PM  Between 7am to 6pm - Pager - [phone]  After 6pm go to [email or web address] - Social research officer, governmentpassword [email or web address]  [employer] [place] [facility]  Office  [phone]  CC: Primary care physician; [facility]

## 2016-07-14 NOTE — Progress Notes (Signed)
Nutrition Brief Note  Chart reviewed secondary to MST score of 3.   Pt now transitioning to comfort care. No further nutrition interventions warranted at this time.  Please re-consult as needed.   Rosary Filosa B. Freida BusmanAllen, RD, LDN [phone] (pager) Weekend/On-Call pager [phone])

## 2016-07-14 NOTE — Progress Notes (Signed)
MD notified of low BP, orders fo 250cc bolus. Upon giving bolus, Pt family alerted me to pts left foot feeling colder than usual. Doppler done on left foot and unable to detect a pulse. Pt has great capillary refill but a lot of swelling. Dr. pyreddy notified. Orders for arterial ultrasound with venous ultra sound for this am. Will continue to monitor.

## 2016-07-14 NOTE — Progress Notes (Signed)
I had a discussion with family about pt's goals of care given her poor Prognosis and repeat hospitalizations over the past 2 months or so.    Family has agreed to COMFORT CARE ONLY.   Orders placed for PRN Ativan, Morphine. Nursing stafff aware.  Social Work make aware earlier.   Time Spent: 30 min.

## 2016-07-14 NOTE — Progress Notes (Addendum)
Patient is alert with confusion, family at bedside, sleeping in between care, r foot pain managed with prn zofran, on 2 L oxygen, poor appetite, bed bound at this time, incontinent of urine, referral for hospice home placement, [facility] staff updated on patients progression, continue to monitor for comfort. Scheduled ativan held as patient is sleeping comfortably between care.

## 2016-07-15 LAB — GLUCOSE, CAPILLARY: GLUCOSE-CAPILLARY: 137 mg/dL — AB (ref 65–99)

## 2016-07-15 MED ORDER — LORAZEPAM 2 MG/ML PO CONC: 1.0000 mg | ORAL | 0 refills | Status: AC | PRN

## 2016-07-15 MED ORDER — LORAZEPAM 2 MG/ML PO CONC: 1.0000 mg | ORAL | Status: DC | PRN

## 2016-07-15 MED ORDER — NYSTATIN 100000 UNIT/GM EX POWD: Freq: Two times a day (BID) | CUTANEOUS | Status: DC

## 2016-07-15 MED ORDER — LORAZEPAM 2 MG/ML IJ SOLN
1.0000 mg | INTRAMUSCULAR | Status: DC | PRN
  Administered 2016-07-15: 1 mg via INTRAVENOUS
  Filled 2016-07-15: qty 1

## 2016-07-15 MED ORDER — MORPHINE SULFATE (CONCENTRATE) 10 MG/0.5ML PO SOLN: 10.0000 mg | ORAL | Status: AC | PRN

## 2016-07-15 MED ORDER — MORPHINE SULFATE (CONCENTRATE) 10 MG/0.5ML PO SOLN: 10.0000 mg | ORAL | Status: DC | PRN

## 2016-07-15 NOTE — Progress Notes (Signed)
Order for blood glucose checks before meals and at bedtime was reviewed with Dr. Renae GlossWieting.  Verbal order was given to discontinue the blood glucose checks.

## 2016-07-15 NOTE — Progress Notes (Signed)
[employer] PROGRESS NOTE  CALLEE ROHRIG VWU:[id number] DOB: [birth year] DOA: 07/13/2016 [facility]: [facility]  HPI/Subjective: Patient sleeping after medication  Objective: Vitals:   07/14/16 2120 07/15/16 0753  BP: (!) 111/94 (!) 85/60  Pulse: (!) 101 74  Resp: 16 16  Temp: 98.4 F (36.9 C) 98 F (36.7 C)    Intake/Output Summary (Last 24 hours) at 07/15/16 0839 Last data filed at 07/14/16 0900  Gross per 24 hour  Intake              240 ml  Output                0 ml  Net              240 ml   Filed Weights   07/13/16 1451 07/13/16 2001  Weight: 67.6 kg (149 lb) 66.6 kg (146 lb 12.8 oz)    ROS: Review of Systems  Unable to perform ROS: Medical condition   Exam: Physical Exam  Constitutional: She appears lethargic.  HENT:  Nose: No mucosal edema.  Mouth closed.  Eyes: Lids are normal.  Neck: Carotid bruit is not present. No thyromegaly present.  Cardiovascular: Regular rhythm.  Tachycardia present.   Respiratory: No accessory muscle usage. She has decreased breath sounds in the right lower field and the left lower field. She has no wheezes. She has no rhonchi. She has no rales.  GI: Soft. Bowel sounds are normal. There is no tenderness.  Musculoskeletal:       Right ankle: She exhibits swelling.       Left ankle: She exhibits swelling.  Neurological: She appears lethargic.  Skin: Skin is dry.  Right foot has a large blister. Right leg some erythema and a blister that opened up.  Psychiatric:  Patient sleeping after medicated this morning.      Data Reviewed: Basic Metabolic Panel:  Recent Labs Lab 07/13/16 1504 07/13/16 2021 07/14/16 0609  NA 133* 132* 133*  K 6.0* 5.1 5.1  CL 95* 97* 97*  CO2 27 29 27   GLUCOSE 99 127* 118*  BUN 74* 69* 67*  CREATININE 2.79* 2.62* 2.08*  CALCIUM 9.6 9.4 9.4  MG 1.9  --   --    Liver Function Tests:  Recent Labs Lab 07/13/16 1504  AST 61*  ALT 35  ALKPHOS 267*  BILITOT 1.5*  PROT 7.4   ALBUMIN 3.2*    Recent Labs Lab 07/13/16 1504  LIPASE 20   CBC:  Recent Labs Lab 07/13/16 1504 07/14/16 0609  WBC 15.7* 13.2*  NEUTROABS 12.4*  --   HGB 14.7 14.8  HCT 46.1 45.8  MCV 79.6* 79.2*  PLT 310 253   Cardiac Enzymes:  Recent Labs Lab 07/13/16 1504 07/13/16 2021  TROPONINI 0.10* 0.09*   BNP (last 3 results)  Recent Labs  04/13/16 2018 04/25/16 1008  BNP 539.0* 773.0*     CBG:  Recent Labs Lab 07/13/16 2131 07/14/16 0756 07/14/16 1145 07/14/16 2123 07/15/16 0749  GLUCAP 117* 126* 151* 154* 137*    Recent Results (from the past 240 hour(s))  Blood Culture (routine x 2)     Status: None (Preliminary result)   Collection Time: 07/13/16  3:04 PM  Result Value Ref Range Status   Specimen Description BLOOD LEFT FOREARM  Final   Special Requests BOTTLES DRAWN AEROBIC AND ANAEROBIC AER,2CC,ANA2CC  Final   Culture NO GROWTH 2 DAYS  Final   Report Status PENDING  Incomplete  Blood Culture (  routine x 2)     Status: None (Preliminary result)   Collection Time: 07/13/16  3:06 PM  Result Value Ref Range Status   Specimen Description BLOOD RT FORE ARM  Final   Special Requests BOTTLES DRAWN AEROBIC AND ANAEROBIC AER,2CC,ANA2CC  Final   Culture NO GROWTH 2 DAYS  Final   Report Status PENDING  Incomplete  Urine culture     Status: None   Collection Time: 07/13/16  3:28 PM  Result Value Ref Range Status   Specimen Description URINE, RANDOM  Final   Special Requests NONE  Final   Culture NO GROWTH [facility]   Final   Report Status 07/14/2016 FINAL  Final  MRSA PCR Screening     Status: Abnormal   Collection Time: 07/13/16  8:00 PM  Result Value Ref Range Status   MRSA by PCR POSITIVE (A) NEGATIVE Final    Comment:        The GeneXpert MRSA Assay (FDA approved for NASAL specimens only), is one component of a comprehensive MRSA colonization surveillance program. It is not intended to diagnose MRSA infection nor to guide  or monitor treatment for MRSA infections. RESULT CALLED TO, READ BACK BY AND VERIFIED WITH:  Victorino SparrowKIERRA TORAIN AT 2139 07/13/16 SDR      Studies: Dg Chest Port 1 View  Result Date: 07/13/2016 CLINICAL DATA:  Initial evaluation for acute failure to thrive, concern for sepsis. History of AFib, CHF, coronary artery disease. Former smoker. EXAM: PORTABLE CHEST 1 VIEW COMPARISON:  Prior radiograph from 09/05/2016. FINDINGS: Median sternotomy wires underlying CABG markers and surgical clips present. Cardiomegaly stable. Mediastinal silhouette within normal limits. Atheromatous plaque present within the aortic arch. Lungs normally inflated. Chronic coarsening of the interstitial markings is similar. Mild scarring within the left mid lung, also similar. No overt pulmonary edema or pleural effusion. No pneumothorax. No consolidative airspace disease. No acute osseous abnormality. IMPRESSION: 1. No active cardiopulmonary disease identified. 2. Stable cardiomegaly without pulmonary edema. 3. Aortic atherosclerosis. Electronically Signed   By: Rise MuBenjamin  McClintock M.D.   On: 07/13/2016 15:29    Scheduled Meds: . LORazepam  1 mg Intravenous Q4H    Assessment/Plan:  1. Failure to thrive. Patient made comfort care measures. Awaiting hospice home bed. Patient not eating very much. 2. Hypotension. Comfort measures ordered. 3. Acute kidney injury and hyperkalemia. Patient has a history of chronic kidney disease. No further labs ordered. 4. Likely cellulitis with blistering of the lower right extremity. 5. History of diabetes 6. History of CHF 7. History of atrial fibrillation 8. History of dementia  Code Status:     Code Status Orders        Start     Ordered   07/13/16 1955  Do not attempt resuscitation (DNR)  Continuous    Question Answer Comment  In the event of cardiac or respiratory ARREST Do not call a "code blue"   In the event of cardiac or respiratory ARREST Do not perform Intubation, CPR,  defibrillation or ACLS   In the event of cardiac or respiratory ARREST Use medication by any route, position, wound care, and other measures to relive pain and suffering. May use oxygen, suction and manual treatment of airway obstruction as needed for comfort.      07/13/16 1954    Code Status History    Date Active Date Inactive Code Status Order ID Comments User Context   07/13/2016  4:50 PM 07/13/2016  7:54 PM DNR [id number]  Loleta Roseory Forbach, MD ED   06/05/2016  4:41 PM 06/08/2016  5:55 PM Full Code [id number]  Wyatt Hasteavid K Hower, MD ED   04/25/2016 12:25 PM 04/27/2016  4:33 PM Full Code [id number]  Enedina FinnerSona Patel, MD ED   04/25/2016 11:58 AM 04/25/2016 12:25 PM Full Code [id number]  Enedina FinnerSona Patel, MD ED   04/13/2016 11:31 PM 04/19/2016  4:45 PM Full Code [id number]  Oralia Manisavid Willis, MD Inpatient    Advance Directive Documentation   Flowsheet Row Most Recent Value  Type of Advance Directive  Healthcare Power of Attorney  Pre-existing out of facility DNR order (yellow form or pink MOST form)  Yellow form placed in chart (order not valid for inpatient use)  "MOST" Form in Place?  No data     Family Communication: Daughter at bedside with her husband. Disposition Plan: To hospice home when bed available  Time spent: 25 minutes now  Alford HighlandWIETING, Wendell Nicoson  [employer]

## 2016-07-15 NOTE — Progress Notes (Signed)
Report has been called to Zella Ballobin, the admissions RN at the [facility].  [facility] for non emergent transport.

## 2016-07-15 NOTE — Discharge Summary (Signed)
[employer] - [facility]   PATIENT NAME: [name]    MR#:  [id number]  DATE OF BIRTH:  19-May-1926  DATE OF ADMISSION:  07/13/2016 ADMITTING PHYSICIAN: Denise PollackQing Chen, MD  DATE OF DISCHARGE: 07/15/2016  PRIMARY CARE PHYSICIAN: [name]    ADMISSION DIAGNOSIS:  Dehydration [E86.0] DNR (do not resuscitate) [Z66] Advanced dementia [F03.90] Demand ischemia (HCC) [I24.8] Failure to thrive in adult [R62.7] Hyperkalemia, diminished renal excretion [E87.5] Hypothermia, initial encounter [T68.XXXA] Acute renal failure, unspecified acute renal failure type (HCC) [N17.9]  DISCHARGE DIAGNOSIS:  Principal Problem:   ARF (acute renal failure) (HCC)   SECONDARY DIAGNOSIS:   Past Medical History:  Diagnosis Date  . Alzheimer disease   . Atrial fibrillation (HCC)   . BBB (bundle branch block)    CHRONIC  LEFT BBB  . CHF (congestive heart failure) (HCC)   . Chronic diastolic CHF (congestive heart failure) (HCC)   . CKD (chronic kidney disease), stage III   . Coronary artery disease   . Diabetes mellitus    TYPE 2  . GERD (gastroesophageal reflux disease)   . Hyperlipidemia   . Hypertension   . Osteoporosis     HOSPITAL COURSE:   1. Failure to thrive. Patient was made comfort care measures and family decided on hospice home placement. Patient not eating much at home. 2. Hypotension. Patient was initially given IV fluid hydration. 3. Acute kidney injury and hyperkalemia. Patient has a history of chronic kidney disease stage III. Patient initially was given IV fluid hydration. 4. Likely cellulitis with blistering of the right lower extremity with pain. When necessary pain medication given 5. History of diabetes 6. History of CHF 7. History of atrial fibrillation 8. History of dementia  DISCHARGE CONDITIONS:   Guarded  CONSULTS OBTAINED:   none  DRUG ALLERGIES:   Allergies  Allergen Reactions  . Risedronate Rash    DISCHARGE  MEDICATIONS:   Current Discharge Medication List    START taking these medications   Details  LORazepam (ATIVAN) 2 MG/ML concentrated solution Take 0.5 mLs (1 mg total) by mouth every 4 (four) hours as needed for anxiety. Qty: 30 mL, Refills: 0    Morphine Sulfate (MORPHINE CONCENTRATE) 10 MG/0.5ML SOLN concentrated solution Take 0.5 mLs (10 mg total) by mouth every 2 (two) hours as needed for severe pain. Qty: 180 mL      CONTINUE these medications which have NOT CHANGED   Details  acetaminophen (TYLENOL) 325 MG tablet Take 650 mg by mouth every 4 (four) hours as needed.      STOP taking these medications     apixaban (ELIQUIS) 2.5 MG TABS tablet      Calcium Carb-Cholecalciferol (CALCIUM PLUS VITAMIN D3) 600-800 MG-UNIT TABS      guaiFENesin-dextromethorphan (ROBITUSSIN DM) 100-10 MG/5ML syrup      hydrocortisone cream 1 %      lisinopril (PRINIVIL,ZESTRIL) 10 MG tablet      metolazone (ZAROXOLYN) 5 MG tablet      metoprolol tartrate (LOPRESSOR) 25 MG tablet      oxyCODONE-acetaminophen (PERCOCET/ROXICET) 5-325 MG tablet      potassium chloride SA (K-DUR,KLOR-CON) 20 MEQ tablet      spironolactone (ALDACTONE) 25 MG tablet      torsemide (DEMADEX) 20 MG tablet      traMADol (ULTRAM) 50 MG tablet      furosemide (LASIX) 20 MG tablet      polyethylene glycol (MIRALAX / GLYCOLAX) packet  potassium chloride (K-DUR) 10 MEQ tablet          DISCHARGE INSTRUCTIONS:   Patient will be discharged to the hospice home  If you experience worsening of your admission symptoms, develop shortness of breath, life threatening emergency, suicidal or homicidal thoughts you must seek medical attention immediately by calling 911 or calling your MD immediately  if symptoms less severe.  You Must read complete instructions/literature along with all the possible adverse reactions/side effects for all the Medicines you take and that have been prescribed to you. Take any new  Medicines after you have completely understood and accept all the possible adverse reactions/side effects.   Please note  You were cared for by a hospitalist during your hospital stay. If you have any questions about your discharge medications or the care you received while you were in the hospital after you are discharged, you can call the unit and asked to speak with the hospitalist on call if the hospitalist that took care of you is not available. Once you are discharged, your primary care physician will handle any further medical issues. Please note that NO REFILLS for any discharge medications will be authorized once you are discharged, as it is imperative that you return to your primary care physician (or establish a relationship with a primary care physician if you do not have one) for your aftercare needs so that they can reassess your need for medications and monitor your lab values.    Today   CHIEF COMPLAINT:   Chief Complaint  Patient presents with  . Failure To Thrive    HISTORY OF PRESENT ILLNESS:  [name]  is a 82 y.o. female brought in with failure to thrive   VITAL SIGNS:  Blood pressure (!) 85/60, pulse 74, temperature 98 F (36.7 C), temperature source Oral, resp. rate 16, height 5\' 5"  (1.651 m), weight 66.6 kg (146 lb 12.8 oz), SpO2 98 %.    DATA REVIEW:   CBC  Recent Labs Lab 07/14/16 0609  WBC 13.2*  HGB 14.8  HCT 45.8  PLT 253    Chemistries   Recent Labs Lab 07/13/16 1504  07/14/16 0609  NA 133*  < > 133*  K 6.0*  < > 5.1  CL 95*  < > 97*  CO2 27  < > 27  GLUCOSE 99  < > 118*  BUN 74*  < > 67*  CREATININE 2.79*  < > 2.08*  CALCIUM 9.6  < > 9.4  MG 1.9  --   --   AST 61*  --   --   ALT 35  --   --   ALKPHOS 267*  --   --   BILITOT 1.5*  --   --   < > = values in this interval not displayed.  Cardiac Enzymes  Recent Labs Lab 07/13/16 2021  TROPONINI 0.09*    Microbiology Results  Results for orders placed or performed  during the hospital encounter of 07/13/16  Blood Culture (routine x 2)     Status: None (Preliminary result)   Collection Time: 07/13/16  3:04 PM  Result Value Ref Range Status   Specimen Description BLOOD LEFT FOREARM  Final   Special Requests BOTTLES DRAWN AEROBIC AND ANAEROBIC AER,2CC,ANA2CC  Final   Culture NO GROWTH 2 DAYS  Final   Report Status PENDING  Incomplete  Blood Culture (routine x 2)     Status: None (Preliminary result)   Collection Time: 07/13/16  3:06 PM  Result Value Ref Range Status   Specimen Description BLOOD RT FORE ARM  Final   Special Requests BOTTLES DRAWN AEROBIC AND ANAEROBIC AER,2CC,ANA2CC  Final   Culture NO GROWTH 2 DAYS  Final   Report Status PENDING  Incomplete  Urine culture     Status: None   Collection Time: 07/13/16  3:28 PM  Result Value Ref Range Status   Specimen Description URINE, RANDOM  Final   Special Requests NONE  Final   Culture NO GROWTH Performed at [facility]   Final   Report Status 07/14/2016 FINAL  Final  MRSA PCR Screening     Status: Abnormal   Collection Time: 07/13/16  8:00 PM  Result Value Ref Range Status   MRSA by PCR POSITIVE (A) NEGATIVE Final    Comment:        The GeneXpert MRSA Assay (FDA approved for NASAL specimens only), is one component of a comprehensive MRSA colonization surveillance program. It is not intended to diagnose MRSA infection nor to guide or monitor treatment for MRSA infections. RESULT CALLED TO, READ BACK BY AND VERIFIED WITH:  Victorino Sparrow AT 2139 07/13/16 SDR     RADIOLOGY:  Dg Chest Port 1 View  Result Date: 07/13/2016 CLINICAL DATA:  Initial evaluation for acute failure to thrive, concern for sepsis. History of AFib, CHF, coronary artery disease. Former smoker. EXAM: PORTABLE CHEST 1 VIEW COMPARISON:  Prior radiograph from 09/05/2016. FINDINGS: Median sternotomy wires underlying CABG markers and surgical clips present. Cardiomegaly stable. Mediastinal silhouette within normal  limits. Atheromatous plaque present within the aortic arch. Lungs normally inflated. Chronic coarsening of the interstitial markings is similar. Mild scarring within the left mid lung, also similar. No overt pulmonary edema or pleural effusion. No pneumothorax. No consolidative airspace disease. No acute osseous abnormality. IMPRESSION: 1. No active cardiopulmonary disease identified. 2. Stable cardiomegaly without pulmonary edema. 3. Aortic atherosclerosis. Electronically Signed   By: Rise Mu M.D.   On: 07/13/2016 15:29    Management plans discussed with the patient, family and they are in agreement.  CODE STATUS:     Code Status Orders        Start     Ordered   07/13/16 1955  Do not attempt resuscitation (DNR)  Continuous    Question Answer Comment  In the event of cardiac or respiratory ARREST Do not call a "code blue"   In the event of cardiac or respiratory ARREST Do not perform Intubation, CPR, defibrillation or ACLS   In the event of cardiac or respiratory ARREST Use medication by any route, position, wound care, and other measures to relive pain and suffering. May use oxygen, suction and manual treatment of airway obstruction as needed for comfort.      07/13/16 1954    Code Status History    Date Active Date Inactive Code Status Order ID Comments User Context   07/13/2016  4:50 PM 07/13/2016  7:54 PM DNR [id number]  Loleta Rose, MD ED   06/05/2016  4:41 PM 06/08/2016  5:55 PM Full Code [id number]  Wyatt Haste, MD ED   04/25/2016 12:25 PM 04/27/2016  4:33 PM Full Code [id number]  Enedina Finner, MD ED   04/25/2016 11:58 AM 04/25/2016 12:25 PM Full Code [id number]  Enedina Finner, MD ED   04/13/2016 11:31 PM 04/19/2016  4:45 PM Full Code [id number]  Oralia Manis, MD Inpatient    Advance Directive Documentation   Flowsheet Row Most Recent Value  Type of Advance Directive  Healthcare Power of Attorney  Pre-existing out of facility DNR order (yellow form or pink MOST form)  Yellow form  placed in chart (order not valid for inpatient use)  "MOST" Form in Place?  No data      TOTAL TIME TAKING CARE OF THIS PATIENT: 35 minutes. In coordination of care and setting up discharge to hospice home.   Alford HighlandWIETING, Inioluwa Boulay M.D on 07/15/2016 at 1:11 PM  Between 7am to 6pm - Pager - [phone]  After 6pm go to [email or web address] - password [email or web address]  [employer] Office  [phone]  CC: Primary care physician; [name]

## 2016-07-18 LAB — CULTURE, BLOOD (ROUTINE X 2)
Culture: NO GROWTH
Culture: NO GROWTH

## 2016-07-28 DEATH — deceased

## 2016-09-11 IMAGING — CR DG CHEST 2V
2 series · 2 of 2 positions shown · non-contrast
Comparison: 04/16/2016

CLINICAL DATA: Shortness of breath and chest pain for 1 week

EXAM:
CHEST  2 VIEW

[chest pa]
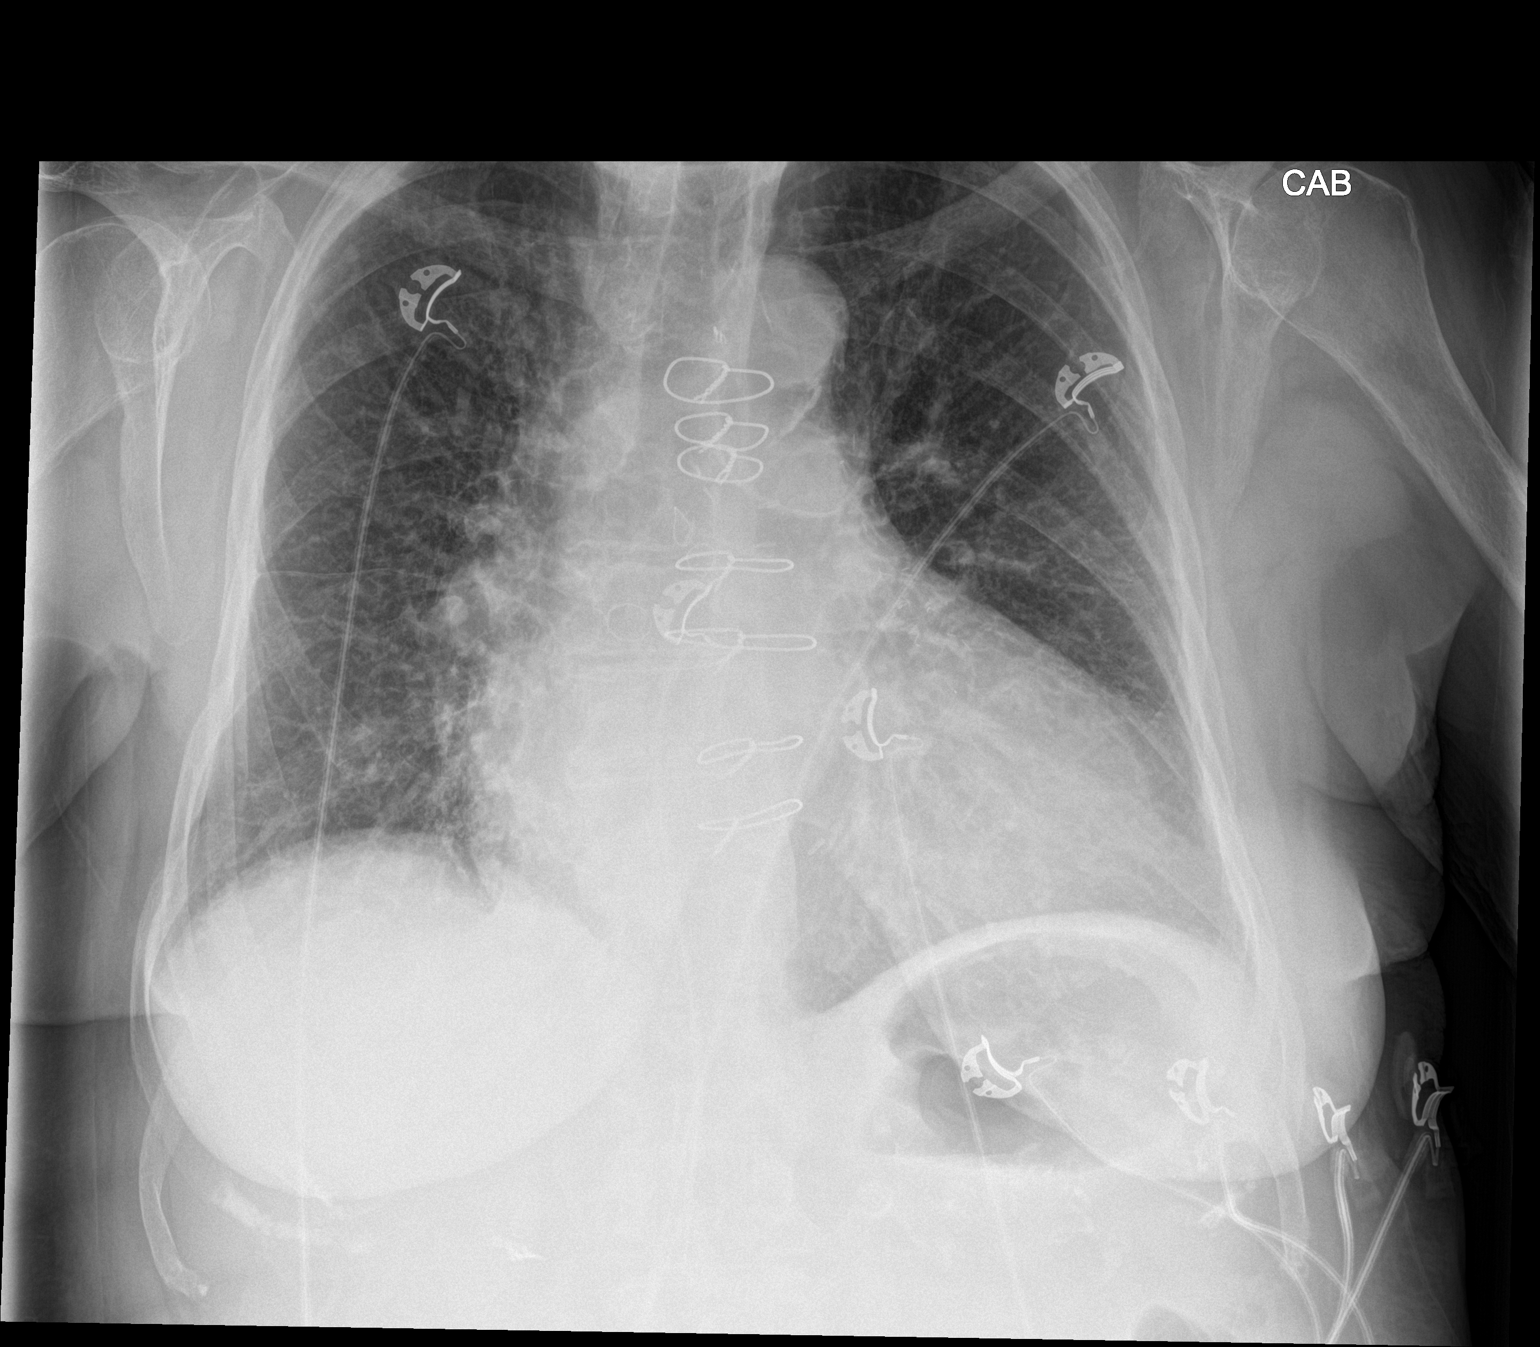

[chest lat]
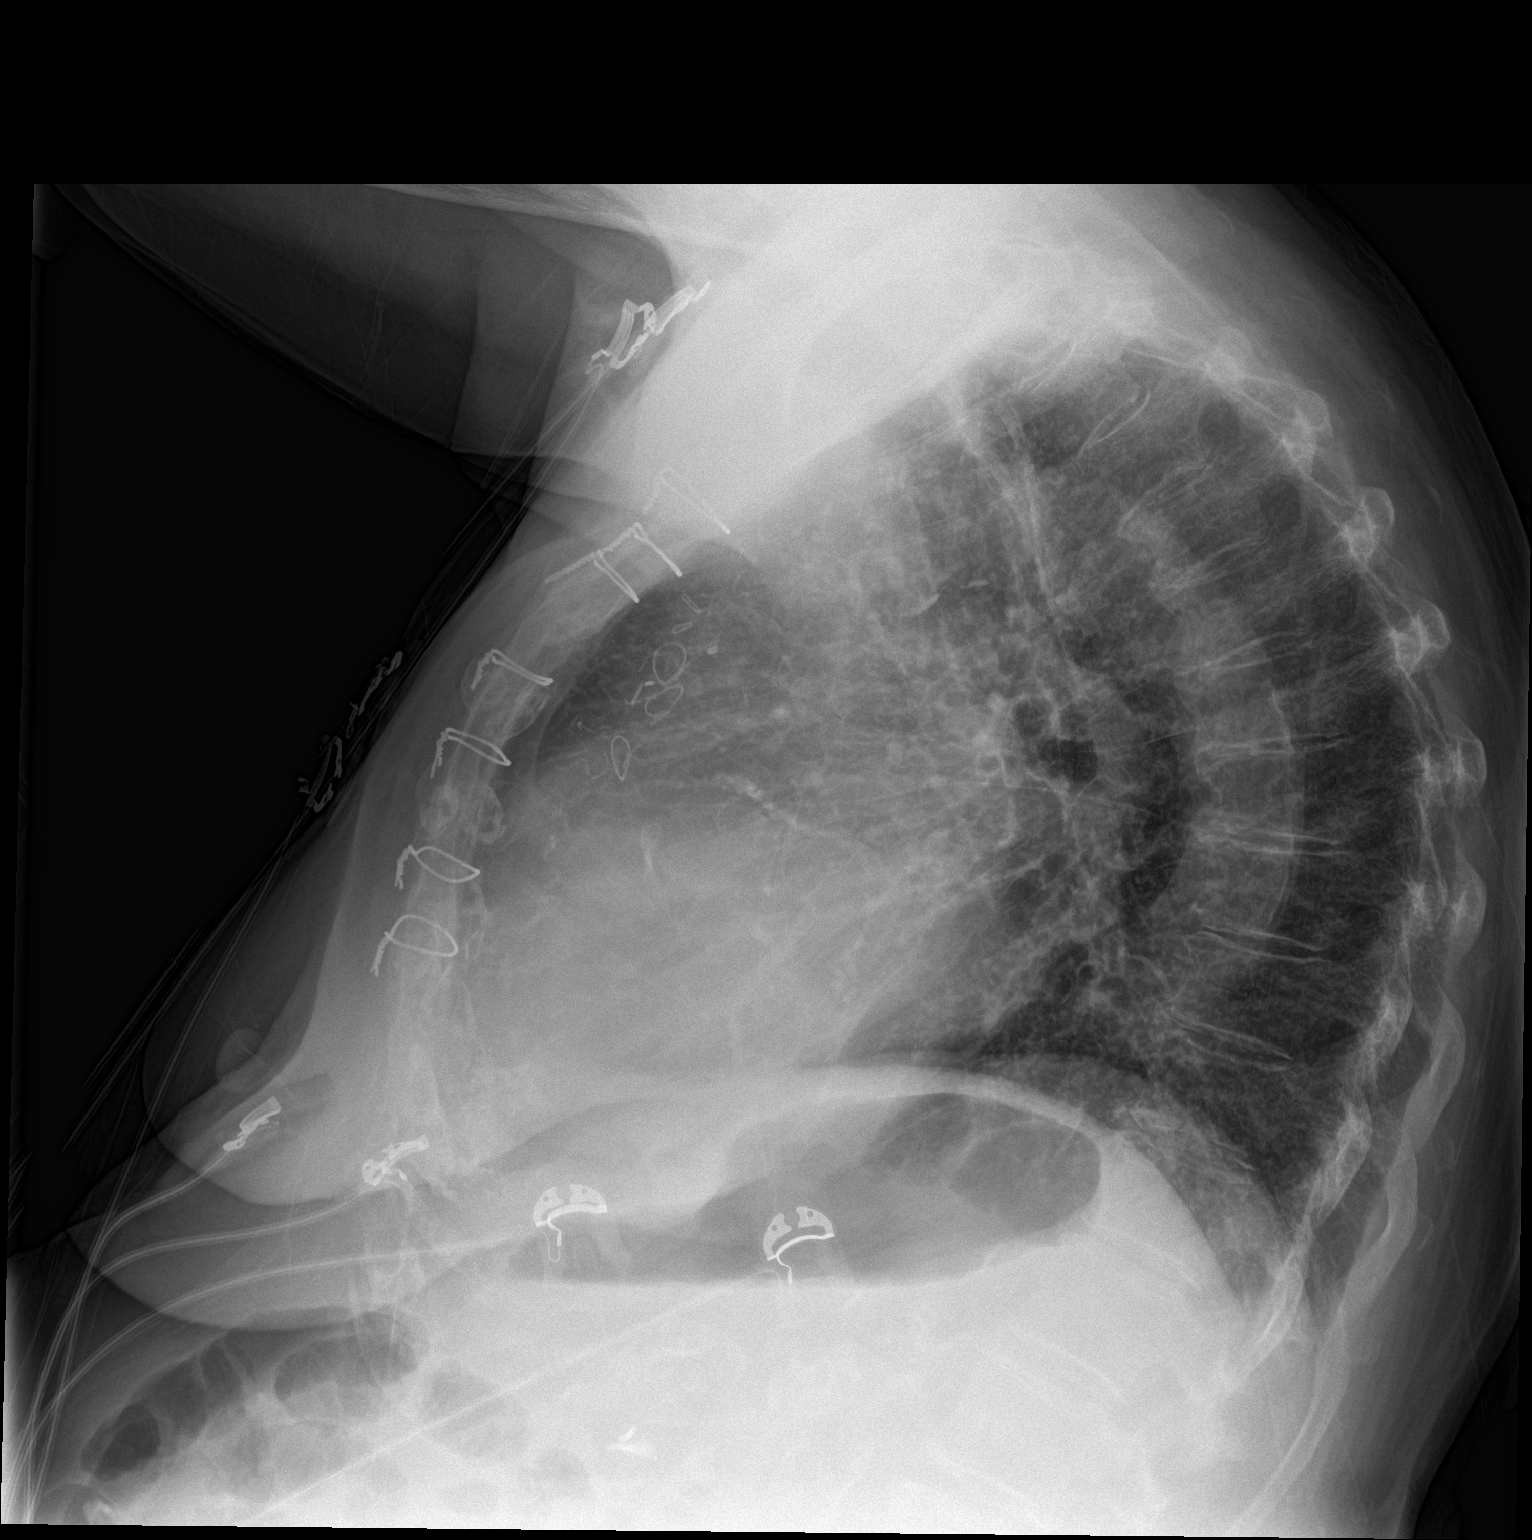

[2 of 2 positions shown; findings below may reference images not displayed]

FINDINGS: Cardiac shadow remains enlarged. Postsurgical changes are again
noted. Diffuse interstitial changes are again seen. No focal
infiltrate or sizable effusion is noted. No acute bony abnormality
is seen.
IMPRESSION: No active cardiopulmonary disease.

## 2016-09-20 ENCOUNTER — Telehealth: Payer: Self-pay | Admitting: Family

## 2016-09-20 ENCOUNTER — Ambulatory Visit: Payer: Medicare Other | Admitting: Family

## 2016-09-20 NOTE — Telephone Encounter (Signed)
Patient did not show for her [facility] appointment on 09/20/16. Will attempt to reschedule.

## 2016-11-29 IMAGING — DX DG CHEST 1V PORT
1 series · 1 of 1 positions shown · non-contrast
Comparison: Prior radiograph from 09/05/2016.

CLINICAL DATA: Initial evaluation for acute failure to thrive,
concern for sepsis. History of AFib, CHF, coronary artery disease.
Former smoker.

EXAM:
PORTABLE CHEST 1 VIEW

[chest ap]
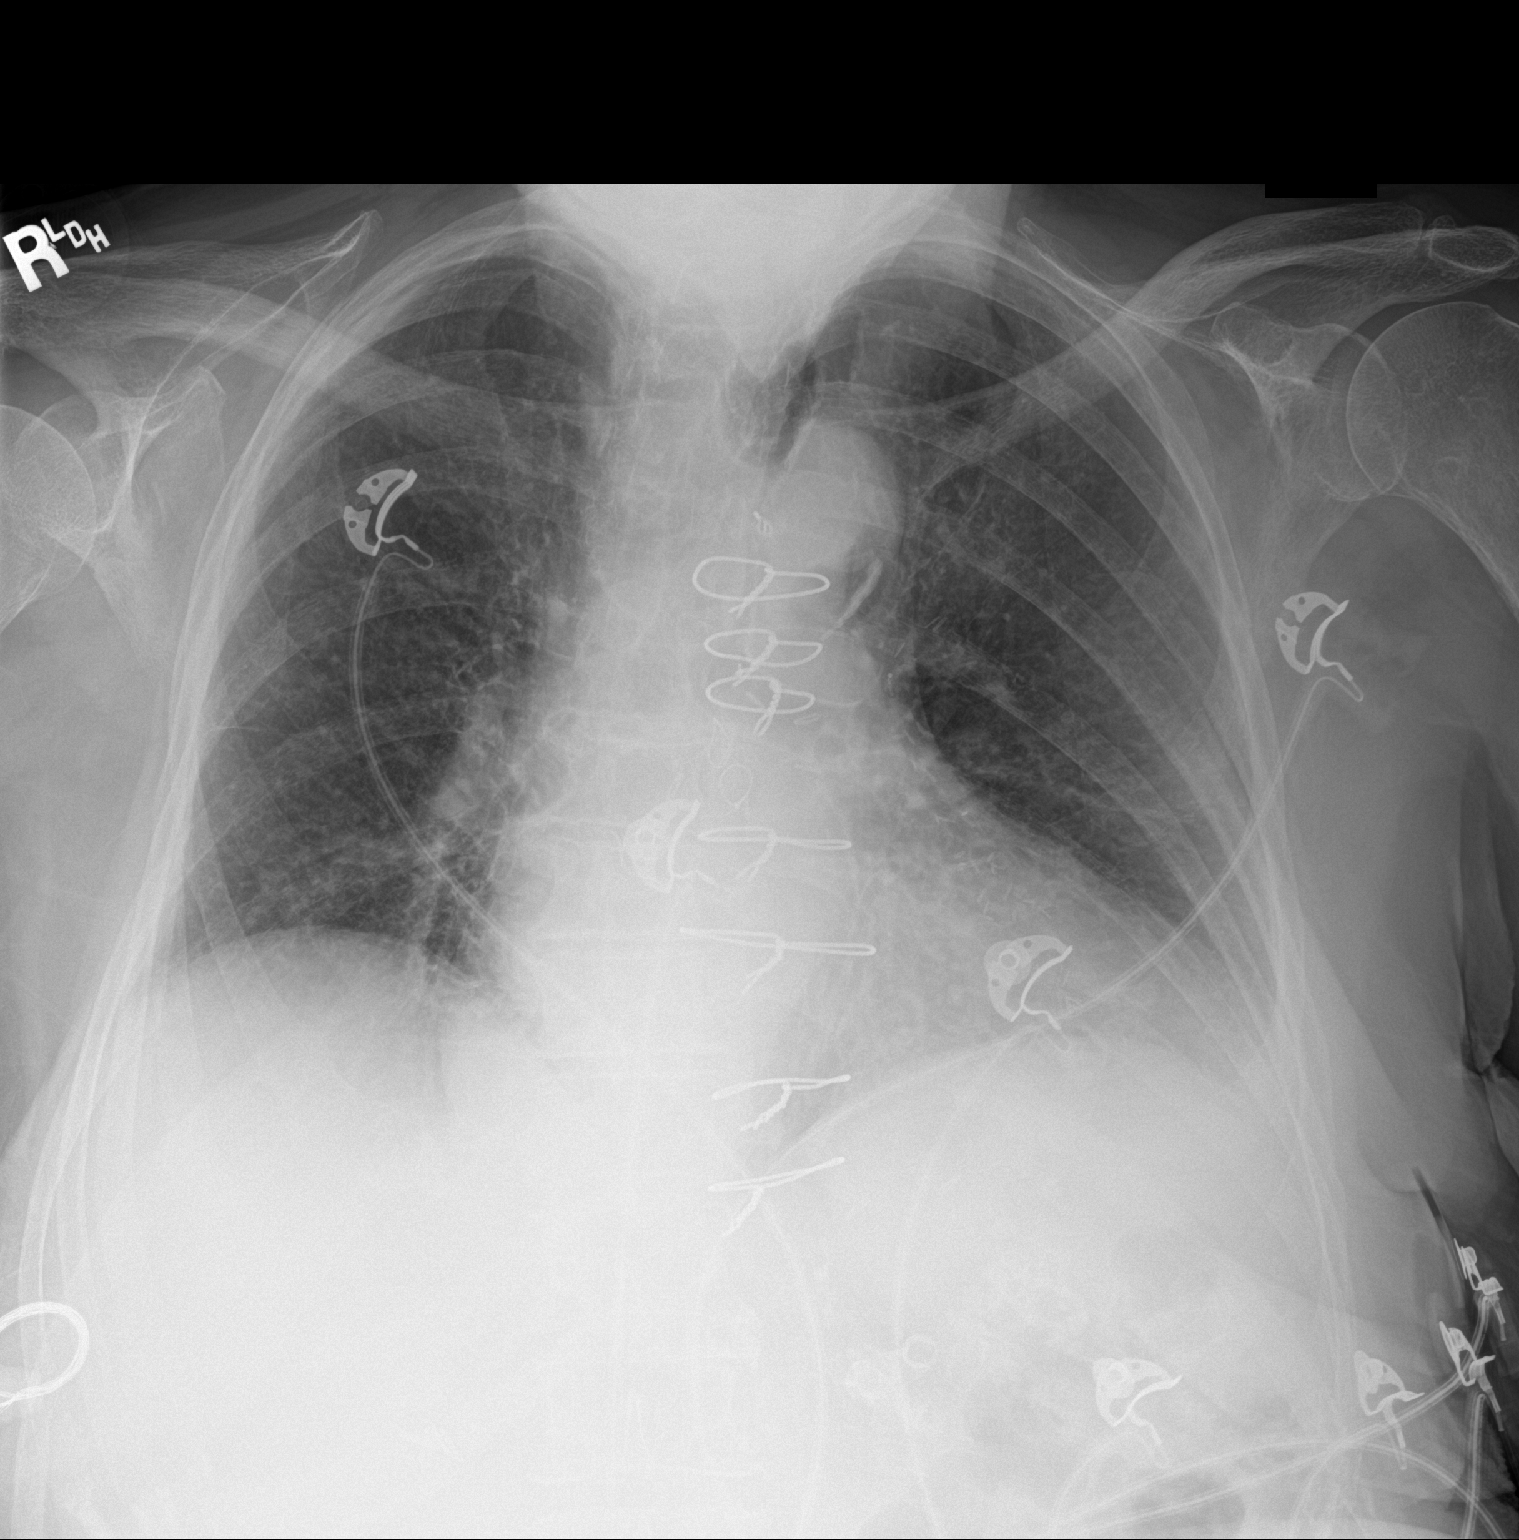

[1 of 1 positions shown; findings below may reference images not displayed]

FINDINGS: Median sternotomy wires underlying CABG markers and surgical clips
present. Cardiomegaly stable. Mediastinal silhouette within normal
limits. Atheromatous plaque present within the aortic arch.

Lungs normally inflated. Chronic coarsening of the interstitial
markings is similar. Mild scarring within the left mid lung, also
similar. No overt pulmonary edema or pleural effusion. No
pneumothorax. No consolidative airspace disease.

No acute osseous abnormality.
IMPRESSION: 1. No active cardiopulmonary disease identified.
2. Stable cardiomegaly without pulmonary edema.
3. Aortic atherosclerosis.
